# Patient Record
Sex: Female | Born: 1950 | Race: White | Hispanic: No | Marital: Married | Smoking: Former smoker
Health system: Southern US, Community
[De-identification: ages and names within clinical notes are randomized; demographics above are authoritative.]

## PROBLEM LIST (undated history)

## (undated) DIAGNOSIS — J45909 Unspecified asthma, uncomplicated: Secondary | ICD-10-CM

## (undated) DIAGNOSIS — M1990 Unspecified osteoarthritis, unspecified site: Secondary | ICD-10-CM

## (undated) DIAGNOSIS — G5600 Carpal tunnel syndrome, unspecified upper limb: Secondary | ICD-10-CM

## (undated) DIAGNOSIS — L409 Psoriasis, unspecified: Secondary | ICD-10-CM

## (undated) DIAGNOSIS — F0390 Unspecified dementia without behavioral disturbance: Secondary | ICD-10-CM

## (undated) DIAGNOSIS — L309 Dermatitis, unspecified: Secondary | ICD-10-CM

## (undated) DIAGNOSIS — E7800 Pure hypercholesterolemia, unspecified: Secondary | ICD-10-CM

## (undated) DIAGNOSIS — I10 Essential (primary) hypertension: Secondary | ICD-10-CM

## (undated) HISTORY — PX: HAND SURGERY: SHX662

## (undated) HISTORY — PX: KNEE SURGERY: SHX244

## (undated) HISTORY — PX: SHOULDER SURGERY: SHX246

## (undated) HISTORY — PX: TONSILLECTOMY: SUR1361

## (undated) HISTORY — DX: Dermatitis, unspecified: L30.9

## (undated) HISTORY — DX: Pure hypercholesterolemia, unspecified: E78.00

---

## 2001-05-14 ENCOUNTER — Ambulatory Visit (HOSPITAL_BASED_OUTPATIENT_CLINIC_OR_DEPARTMENT_OTHER): Admission: RE | Admit: 2001-05-14 | Discharge: 2001-05-14 | Payer: Self-pay | Admitting: Orthopedic Surgery

## 2002-07-08 ENCOUNTER — Emergency Department (HOSPITAL_COMMUNITY): Admission: EM | Admit: 2002-07-08 | Discharge: 2002-07-08 | Payer: Self-pay | Admitting: Emergency Medicine

## 2002-07-09 ENCOUNTER — Encounter: Payer: Self-pay | Admitting: Emergency Medicine

## 2003-06-03 ENCOUNTER — Ambulatory Visit (HOSPITAL_BASED_OUTPATIENT_CLINIC_OR_DEPARTMENT_OTHER): Admission: RE | Admit: 2003-06-03 | Discharge: 2003-06-03 | Payer: Self-pay | Admitting: Orthopedic Surgery

## 2003-06-03 ENCOUNTER — Ambulatory Visit (HOSPITAL_COMMUNITY): Admission: RE | Admit: 2003-06-03 | Discharge: 2003-06-03 | Payer: Self-pay | Admitting: Orthopedic Surgery

## 2004-07-25 ENCOUNTER — Ambulatory Visit (HOSPITAL_BASED_OUTPATIENT_CLINIC_OR_DEPARTMENT_OTHER): Admission: RE | Admit: 2004-07-25 | Discharge: 2004-07-25 | Payer: Self-pay | Admitting: Orthopedic Surgery

## 2004-07-25 ENCOUNTER — Ambulatory Visit (HOSPITAL_COMMUNITY): Admission: RE | Admit: 2004-07-25 | Discharge: 2004-07-25 | Payer: Self-pay | Admitting: Orthopedic Surgery

## 2012-03-20 ENCOUNTER — Other Ambulatory Visit: Payer: Self-pay | Admitting: Orthopedic Surgery

## 2012-03-20 DIAGNOSIS — M25569 Pain in unspecified knee: Secondary | ICD-10-CM

## 2012-03-21 ENCOUNTER — Ambulatory Visit
Admission: RE | Admit: 2012-03-21 | Discharge: 2012-03-21 | Disposition: A | Discharge status: Home / Self Care | Payer: BC Managed Care – PPO | Source: Ambulatory Visit | Attending: Orthopedic Surgery | Admitting: Orthopedic Surgery

## 2012-03-21 DIAGNOSIS — M25569 Pain in unspecified knee: Secondary | ICD-10-CM

## 2013-03-28 ENCOUNTER — Ambulatory Visit (INDEPENDENT_AMBULATORY_CARE_PROVIDER_SITE_OTHER): Payer: BC Managed Care – PPO | Admitting: Emergency Medicine

## 2013-03-28 VITALS — BP 192/90 | HR 86 | Temp 98.0°F | Resp 16 | Ht 67.0 in | Wt 179.0 lb

## 2013-03-28 DIAGNOSIS — S025XXB Fracture of tooth (traumatic), initial encounter for open fracture: Secondary | ICD-10-CM

## 2013-03-28 DIAGNOSIS — S025XXA Fracture of tooth (traumatic), initial encounter for closed fracture: Secondary | ICD-10-CM

## 2013-03-28 MED ORDER — CLINDAMYCIN HCL 150 MG PO CAPS: 150.0000 mg | ORAL_CAPSULE | Freq: Three times a day (TID) | ORAL | Status: DC

## 2013-03-28 MED ORDER — CEPHALEXIN 500 MG PO CAPS: 500.0000 mg | ORAL_CAPSULE | Freq: Four times a day (QID) | ORAL | Status: DC

## 2013-03-28 MED ORDER — HYDROCODONE-ACETAMINOPHEN 5-325 MG PO TABS: 1.0000 | ORAL_TABLET | ORAL | Status: DC | PRN

## 2013-03-28 NOTE — Progress Notes (Signed)
[facility] [address], [place] [address] [phone]  Date:  03/28/2013   Name:  [name]   DOB:  1951-06-23   MRN:  [id number]  PCP:  No primary provider on file.    Chief Complaint: Abscess   History of Present Illness:  [name] is a 62 y.o. very pleasant female patient who presents with the following:  Lost a filling in left lower jaw at least a week ago.  Began to have pain and swelling Thursday and was unable to see her dentist.  She will try for appt next week. No fever or chills.  Thermal sensitivity and air sensitivity.  No improvement with over the counter medications or other home remedies. Denies other complaint or health concern today.   There are no active problems to display for this patient.   History reviewed. No pertinent past medical history.  History reviewed. No pertinent past surgical history.  History  Substance Use Topics  . Smoking status: Never Smoker   . Smokeless tobacco: Not on file  . Alcohol Use: Yes    History reviewed. No pertinent family history.  Allergies  Allergen Reactions  . Ibuprofen     dizzy    Medication list has been reviewed and updated.  No current outpatient prescriptions on file prior to visit.   No current facility-administered medications on file prior to visit.    Review of Systems:  As per HPI, otherwise negative.    Physical Examination: Filed Vitals:   03/28/13 1213  BP: 192/90  Pulse: 86  Temp: 98 F (36.7 C)  Resp: 16   Filed Vitals:   03/28/13 1213  Height: 5\' 7"  (1.702 m)  Weight: 179 lb (81.194 kg)   Body mass index is 28.03 kg/(m^2). Ideal Body Weight: Weight in (lb) to have BMI = 25: 159.3   GEN: WDWN, NAD, Non-toxic, Alert & Oriented x 3 HEENT: Atraumatic, Normocephalic.  Ears and Nose: No external deformity. EXTR: No clubbing/cyanosis/edema NEURO: Normal gait.  PSYCH: Normally interactive. Conversant. Not depressed or anxious appearing.   Calm demeanor.  Mouth:  Fracture #19 and 20.  Minimal cellulitis.  Assessment and Plan: Fracture teeth Dentist  Keflex Clindamycin vicodin   Signed,  Phillips Odor, MD

## 2013-03-28 NOTE — Patient Instructions (Addendum)
Dental Fracture  You have a dental fracture or injury. This can mean the tooth is loose, has a chip in the enamel or is broken. If just the outer enamel is chipped, there is a good chance the tooth will not become infected. The only treatment needed may be to smooth off a rough edge. Fractures into the deeper layers (dentin and pulp) cause greater pain and are more likely to become infected. These require you to see a dentist as soon as possible to save the tooth.  Loose teeth may need to be wired or bonded with a plastic splint to hold them in place. A paste may be painted on the open area of the broken tooth to reduce the pain. Antibiotics and pain medicine may be prescribed. Choosing a soft or liquid diet and rinsing the mouth out with warm water after meals may be helpful.  See your dentist as recommended. Failure to seek care or follow up with a dentist or other specialist as recommended could result in the loss of your tooth, infection, or permanent dental problems.  SEEK MEDICAL CARE IF:    You have increased pain not controlled with medicines.   You have swelling around the tooth, in the face or neck.   You have bleeding which starts, continues, or gets worse.   You have a fever.  Document Released: 09/13/2004 Document Revised: 10/29/2011 Document Reviewed: 06/28/2009  ExitCare Patient Information 2014 [employer].

## 2014-09-06 ENCOUNTER — Emergency Department (HOSPITAL_BASED_OUTPATIENT_CLINIC_OR_DEPARTMENT_OTHER): Payer: Medicare Other

## 2014-09-06 ENCOUNTER — Encounter (HOSPITAL_BASED_OUTPATIENT_CLINIC_OR_DEPARTMENT_OTHER): Payer: Self-pay | Admitting: *Deleted

## 2014-09-06 ENCOUNTER — Emergency Department (HOSPITAL_BASED_OUTPATIENT_CLINIC_OR_DEPARTMENT_OTHER)
Admission: EM | Admit: 2014-09-06 | Discharge: 2014-09-06 | Disposition: A | Discharge status: Home / Self Care | Payer: Medicare Other | Attending: Emergency Medicine | Admitting: Emergency Medicine

## 2014-09-06 DIAGNOSIS — S99921A Unspecified injury of right foot, initial encounter: Secondary | ICD-10-CM | POA: Diagnosis present

## 2014-09-06 DIAGNOSIS — Y998 Other external cause status: Secondary | ICD-10-CM | POA: Insufficient documentation

## 2014-09-06 DIAGNOSIS — Y9302 Activity, running: Secondary | ICD-10-CM | POA: Insufficient documentation

## 2014-09-06 DIAGNOSIS — W228XXA Striking against or struck by other objects, initial encounter: Secondary | ICD-10-CM | POA: Insufficient documentation

## 2014-09-06 DIAGNOSIS — Y9289 Other specified places as the place of occurrence of the external cause: Secondary | ICD-10-CM | POA: Diagnosis not present

## 2014-09-06 DIAGNOSIS — Z792 Long term (current) use of antibiotics: Secondary | ICD-10-CM | POA: Diagnosis not present

## 2014-09-06 DIAGNOSIS — T1490XA Injury, unspecified, initial encounter: Secondary | ICD-10-CM

## 2014-09-06 DIAGNOSIS — S96911A Strain of unspecified muscle and tendon at ankle and foot level, right foot, initial encounter: Secondary | ICD-10-CM | POA: Diagnosis not present

## 2014-09-06 NOTE — ED Notes (Signed)
Right ankle pain. She ran into her freezer this am.

## 2014-09-06 NOTE — ED Provider Notes (Signed)
CSN: [id number]     Arrival date & time 09/06/14  1211 History   First MD Initiated Contact with Patient 09/06/14 1346     Chief Complaint  Patient presents with  . Foot Pain     (Consider location/radiation/quality/duration/timing/severity/associated sxs/prior Treatment) HPI Comments: Pt state that she ran into the freezer this morning and now she is having right lateral ankle pain and swelling.no previous injury  Patient is a 64 y.o. female presenting with lower extremity pain. The history is provided by the patient. No language interpreter was used.  Foot Pain This is a new problem. The current episode started today. The problem occurs constantly. The problem has been unchanged. The symptoms are aggravated by walking. She has tried nothing for the symptoms.    History reviewed. No pertinent past medical history. Past Surgical History  Procedure Laterality Date  . Tonsillectomy     No family history on file. History  Substance Use Topics  . Smoking status: Never Smoker   . Smokeless tobacco: Not on file  . Alcohol Use: Yes   OB History    No data available     Review of Systems  All other systems reviewed and are negative.     Allergies  Ibuprofen  Home Medications   Prior to Admission medications   Medication Sig Start Date End Date Taking? Authorizing Provider  cephALEXin (KEFLEX) 500 MG capsule Take 1 capsule (500 mg total) by mouth 4 (four) times daily. 03/28/13   Carmelina DaneJeffery S Anderson, MD  clindamycin (CLEOCIN) 150 MG capsule Take 1 capsule (150 mg total) by mouth 3 (three) times daily. 03/28/13   Carmelina DaneJeffery S Anderson, MD  HYDROcodone-acetaminophen (NORCO) 5-325 MG per tablet Take 1-2 tablets by mouth every 4 (four) hours as needed for pain. 03/28/13   Carmelina DaneJeffery S Anderson, MD   BP 161/78 mmHg  Pulse 73  Temp(Src) 97.7 F (36.5 C) (Oral)  Resp 18  Ht 5\' 6"  (1.676 m)  Wt 180 lb (81.647 kg)  BMI 29.07 kg/m2  SpO2 99% Physical Exam  Constitutional: She is oriented  to person, place, and time. She appears well-developed and well-nourished.  Cardiovascular: Normal rate and regular rhythm.   Pulmonary/Chest: Effort normal and breath sounds normal.  Musculoskeletal: Normal range of motion.  Swelling noted to the  Right lateral ankle swelling. Pulses intact. Full rom  Neurological: She is alert and oriented to person, place, and time.  Skin: Skin is warm and dry.  Psychiatric: She has a normal mood and affect.  Nursing note and vitals reviewed.   ED Course  Procedures (including critical care time) Labs Review Labs Reviewed - No data to display  Imaging Review Dg Foot Complete Right  09/06/2014   CLINICAL DATA:  Initial encounter for lateral right foot pain after stubbing right foot (pinky toe) this morning on refrigerator  EXAM: RIGHT FOOT COMPLETE - 3+ VIEW  COMPARISON:  None.  FINDINGS: Three-view exam shows no evidence for an acute fracture. No subluxation or dislocation. Degenerative changes with hallux valgus deformity are seen at the MTP joint of the great toe.  IMPRESSION: Negative.   Electronically Signed   By: Kennith CenterEric  Mansell M.D.   On: 09/06/2014 12:53     EKG Interpretation None      MDM   Final diagnoses:  Ankle strain, right, initial encounter    Pt placed in aso and instructed on ortho follow up . Pt refusing any pain medication    Teressa LowerVrinda Tanganika Barradas, NP 09/06/14 1426  Candyce Churn III, MD 09/06/14 [phone]

## 2014-09-06 NOTE — Discharge Instructions (Signed)
Ankle Sprain  An ankle sprain is an injury to the strong, fibrous tissues (ligaments) that hold your ankle bones together.   HOME CARE   · Put ice on your ankle for 1-2 days or as told by your doctor.  ¨ Put ice in a plastic bag.  ¨ Place a towel between your skin and the bag.  ¨ Leave the ice on for 15-20 minutes at a time, every 2 hours while you are awake.  · Only take medicine as told by your doctor.  · Raise (elevate) your injured ankle above the level of your heart as much as possible for 2-3 days.  · Use crutches if your doctor tells you to. Slowly put your own weight on the affected ankle. Use the crutches until you can walk without pain.  · If you have a plaster splint:  ¨ Do not rest it on anything harder than a pillow for 24 hours.  ¨ Do not put weight on it.  ¨ Do not get it wet.  ¨ Take it off to shower or bathe.  · If given, use an elastic wrap or support stocking for support. Take the wrap off if your toes lose feeling (numb), tingle, or turn cold or blue.  · If you have an air splint:  ¨ Add or let out air to make it comfortable.  ¨ Take it off at night and to shower and bathe.  ¨ Wiggle your toes and move your ankle up and down often while you are wearing it.  GET HELP IF:  · You have rapidly increasing bruising or puffiness (swelling).  · Your toes feel very cold.  · You lose feeling in your foot.  · Your medicine does not help your pain.  GET HELP RIGHT AWAY IF:   · Your toes lose feeling (numb) or turn blue.  · You have severe pain that is increasing.  MAKE SURE YOU:   · Understand these instructions.  · Will watch your condition.  · Will get help right away if you are not doing well or get worse.  Document Released: 01/23/2008 Document Revised: 12/21/2013 Document Reviewed: 02/18/2012  ExitCare® Patient Information ©2015 [employer]. This information is not intended to replace advice given to you by your health care provider. Make sure you discuss any questions you have with your health care  provider.

## 2015-04-16 ENCOUNTER — Encounter (HOSPITAL_COMMUNITY): Payer: Self-pay | Admitting: *Deleted

## 2015-04-16 ENCOUNTER — Emergency Department (HOSPITAL_BASED_OUTPATIENT_CLINIC_OR_DEPARTMENT_OTHER)
Admit: 2015-04-16 | Discharge: 2015-04-16 | Disposition: A | Discharge status: Home / Self Care | Payer: Medicare Other | Attending: Emergency Medicine | Admitting: Emergency Medicine

## 2015-04-16 ENCOUNTER — Observation Stay (HOSPITAL_COMMUNITY)
Admission: EM | Admit: 2015-04-16 | Discharge: 2015-04-18 | Disposition: A | Discharge status: Home / Self Care | Payer: Medicare Other | Attending: Family Medicine | Admitting: Family Medicine

## 2015-04-16 ENCOUNTER — Emergency Department (HOSPITAL_COMMUNITY): Payer: Medicare Other

## 2015-04-16 DIAGNOSIS — Z7982 Long term (current) use of aspirin: Secondary | ICD-10-CM | POA: Insufficient documentation

## 2015-04-16 DIAGNOSIS — Z886 Allergy status to analgesic agent status: Secondary | ICD-10-CM | POA: Insufficient documentation

## 2015-04-16 DIAGNOSIS — R410 Disorientation, unspecified: Secondary | ICD-10-CM | POA: Diagnosis not present

## 2015-04-16 DIAGNOSIS — R413 Other amnesia: Secondary | ICD-10-CM | POA: Insufficient documentation

## 2015-04-16 DIAGNOSIS — E669 Obesity, unspecified: Secondary | ICD-10-CM | POA: Insufficient documentation

## 2015-04-16 DIAGNOSIS — Z807 Family history of other malignant neoplasms of lymphoid, hematopoietic and related tissues: Secondary | ICD-10-CM | POA: Insufficient documentation

## 2015-04-16 DIAGNOSIS — Z6829 Body mass index (BMI) 29.0-29.9, adult: Secondary | ICD-10-CM | POA: Insufficient documentation

## 2015-04-16 DIAGNOSIS — E785 Hyperlipidemia, unspecified: Secondary | ICD-10-CM | POA: Diagnosis not present

## 2015-04-16 DIAGNOSIS — I10 Essential (primary) hypertension: Secondary | ICD-10-CM | POA: Insufficient documentation

## 2015-04-16 DIAGNOSIS — I6523 Occlusion and stenosis of bilateral carotid arteries: Secondary | ICD-10-CM | POA: Insufficient documentation

## 2015-04-16 DIAGNOSIS — R42 Dizziness and giddiness: Principal | ICD-10-CM | POA: Insufficient documentation

## 2015-04-16 DIAGNOSIS — G459 Transient cerebral ischemic attack, unspecified: Secondary | ICD-10-CM | POA: Diagnosis present

## 2015-04-16 DIAGNOSIS — R7303 Prediabetes: Secondary | ICD-10-CM | POA: Insufficient documentation

## 2015-04-16 DIAGNOSIS — M7989 Other specified soft tissue disorders: Secondary | ICD-10-CM

## 2015-04-16 DIAGNOSIS — Z87891 Personal history of nicotine dependence: Secondary | ICD-10-CM | POA: Insufficient documentation

## 2015-04-16 DIAGNOSIS — R471 Dysarthria and anarthria: Secondary | ICD-10-CM | POA: Diagnosis not present

## 2015-04-16 DIAGNOSIS — G9340 Encephalopathy, unspecified: Secondary | ICD-10-CM | POA: Insufficient documentation

## 2015-04-16 DIAGNOSIS — Z8249 Family history of ischemic heart disease and other diseases of the circulatory system: Secondary | ICD-10-CM | POA: Insufficient documentation

## 2015-04-16 DIAGNOSIS — L409 Psoriasis, unspecified: Secondary | ICD-10-CM | POA: Diagnosis not present

## 2015-04-16 DIAGNOSIS — Z9889 Other specified postprocedural states: Secondary | ICD-10-CM | POA: Insufficient documentation

## 2015-04-16 HISTORY — DX: Psoriasis, unspecified: L40.9

## 2015-04-16 LAB — COMPREHENSIVE METABOLIC PANEL
ALBUMIN: 3.7 g/dL (ref 3.5–5.0)
ALK PHOS: 107 U/L (ref 38–126)
ALT: 30 U/L (ref 14–54)
ANION GAP: 8 (ref 5–15)
AST: 23 U/L (ref 15–41)
BILIRUBIN TOTAL: 0.6 mg/dL (ref 0.3–1.2)
BUN: 12 mg/dL (ref 6–20)
CALCIUM: 9.4 mg/dL (ref 8.9–10.3)
CO2: 27 mmol/L (ref 22–32)
Chloride: 105 mmol/L (ref 101–111)
Creatinine, Ser: 0.82 mg/dL (ref 0.44–1.00)
GLUCOSE: 96 mg/dL (ref 65–99)
POTASSIUM: 3.9 mmol/L (ref 3.5–5.1)
Sodium: 140 mmol/L (ref 135–145)
TOTAL PROTEIN: 6.3 g/dL — AB (ref 6.5–8.1)

## 2015-04-16 LAB — CBC WITH DIFFERENTIAL/PLATELET
Basophils Absolute: 0 10*3/uL (ref 0.0–0.1)
Basophils Relative: 1 % (ref 0–1)
Eosinophils Absolute: 0.3 10*3/uL (ref 0.0–0.7)
Eosinophils Relative: 5 % (ref 0–5)
HEMATOCRIT: 40.9 % (ref 36.0–46.0)
HEMOGLOBIN: 13.4 g/dL (ref 12.0–15.0)
LYMPHS ABS: 1.8 10*3/uL (ref 0.7–4.0)
LYMPHS PCT: 27 % (ref 12–46)
MCH: 29.3 pg (ref 26.0–34.0)
MCHC: 32.8 g/dL (ref 30.0–36.0)
MCV: 89.3 fL (ref 78.0–100.0)
MONO ABS: 0.5 10*3/uL (ref 0.1–1.0)
MONOS PCT: 8 % (ref 3–12)
NEUTROS ABS: 3.9 10*3/uL (ref 1.7–7.7)
NEUTROS PCT: 59 % (ref 43–77)
Platelets: 182 10*3/uL (ref 150–400)
RBC: 4.58 MIL/uL (ref 3.87–5.11)
RDW: 14 % (ref 11.5–15.5)
WBC: 6.5 10*3/uL (ref 4.0–10.5)

## 2015-04-16 LAB — VITAMIN B12: Vitamin B-12: 349 pg/mL (ref 180–914)

## 2015-04-16 LAB — TSH: TSH: 2.531 u[IU]/mL (ref 0.350–4.500)

## 2015-04-16 MED ORDER — ENOXAPARIN SODIUM 40 MG/0.4ML ~~LOC~~ SOLN
40.0000 mg | SUBCUTANEOUS | Status: DC
  Administered 2015-04-16 – 2015-04-17 (×2): 40 mg via SUBCUTANEOUS
  Filled 2015-04-16 (×2): qty 0.4

## 2015-04-16 MED ORDER — STROKE: EARLY STAGES OF RECOVERY BOOK: Freq: Once | Status: DC

## 2015-04-16 MED ORDER — SENNOSIDES-DOCUSATE SODIUM 8.6-50 MG PO TABS: 1.0000 | ORAL_TABLET | Freq: Every evening | ORAL | Status: DC | PRN

## 2015-04-16 MED ORDER — ACETAMINOPHEN 325 MG PO TABS: 650.0000 mg | ORAL_TABLET | ORAL | Status: DC | PRN

## 2015-04-16 MED ORDER — ASPIRIN EC 81 MG PO TBEC
81.0000 mg | DELAYED_RELEASE_TABLET | Freq: Every day | ORAL | Status: DC
  Administered 2015-04-16 – 2015-04-18 (×3): 81 mg via ORAL
  Filled 2015-04-16 (×3): qty 1

## 2015-04-16 MED ORDER — ACETAMINOPHEN 650 MG RE SUPP: 650.0000 mg | RECTAL | Status: DC | PRN

## 2015-04-16 NOTE — ED Notes (Signed)
MD at bedside. 

## 2015-04-16 NOTE — ED Notes (Signed)
Pt ambulated to restroom, pt reports feeling very dizzy.

## 2015-04-16 NOTE — H&P (Signed)
Family Medicine Teaching [facility] Admission History and Physical Service Pager: [phone]  Patient name: VERONDA GABOR Medical record number: [id number] Date of birth: [birth year] Age: 64 y.o. Gender: female  Primary Care Provider: No primary care provider on file. Consultants: neuro Code Status: Full  Chief Complaint: dizziness/slurred speech  Assessment and Plan: AJWA KIMBERLEY is a 64 y.o. female presenting with dizziness . PMH is significant for recent right hand surgery for Dupuytren's contracture, psoriasis, history of smoking  # TIA: symptoms lasted <2 hours. Seen by neuro in ED. CT head negative. Lab workup and EKG unremarkable. Risk factor = 40 pack year smoking history, quit 2007. DDx would also include seizure. - telemetry - RN stroke screen - MRI/MRA - AM labs: lipid panel, A1c, TSH already drawn - per neuro recs added B12 for some concern of continued confusion - PT/OT/SLP eval, will also have PT vestibular eval  # Dupuytren contracture: ortho consulted by phone in ED for some concern of right upper arm swelling (same side as hand surgery). Duplex ultrasound obtained in ED was negative for upper extremity DVT - monitor clinically - do not feel ortho needs to evaluate patient at this time while in the hospital  # Psoriasis: stable - monitor  FEN/GI: regular diet / saline lock Prophylaxis: lovenox  Disposition: admit to tele  History of Present Illness: TONY FRISCIA is a 64 y.o. female presenting with dizziness and slurred speech. Daughter at bedside helps with history as patient cannot completely remember details. Around 9-10am this morning patient was inside trying to get dressed when she developed dizziness. Daughter states she did not fall but was close. Also noted to have some slurred speech. No other symptoms. Dizziness is described as a room spinning sensation worse when walking/upright. Symptoms lasted about 2 hours and had resolved by arrival to the  ED. She also complains of some right arm swelling; of note recently had surgery on her right hand for Dupuytren's contracture 1 week ago.  Review Of Systems: Per HPI with the following additions: no fever/chill, no CP, no SOB, no weakness, no changes in vision, no trouble swallowing, no dysuria, +joint pains (chronic) Otherwise 12 point review of systems was performed and was unremarkable.  There are no active problems to display for this patient.  Past Medical History: Past Medical History  Diagnosis Date  . Psoriasis    Past Surgical History: Past Surgical History  Procedure Laterality Date  . Tonsillectomy    . Hand surgery    . Shoulder surgery    . Knee surgery     Social History: Social History  Substance Use Topics  . Smoking status: Never Smoker   . Smokeless tobacco: None  . Alcohol Use: Yes   Additional social history: lives at home with husband  Please also refer to relevant sections of EMR.  Family History: No family history on file. Allergies and Medications: Allergies  Allergen Reactions  . Ibuprofen     dizzy   No current facility-administered medications on file prior to encounter.   Current Outpatient Prescriptions on File Prior to Encounter  Medication Sig Dispense Refill  . cephALEXin (KEFLEX) 500 MG capsule Take 1 capsule (500 mg total) by mouth 4 (four) times daily. (Patient not taking: Reported on 04/16/2015) 30 capsule 0  . clindamycin (CLEOCIN) 150 MG capsule Take 1 capsule (150 mg total) by mouth 3 (three) times daily. (Patient not taking: Reported on 04/16/2015) 30 capsule 0  . HYDROcodone-acetaminophen (NORCO) 5-325 MG per  tablet Take 1-2 tablets by mouth every 4 (four) hours as needed for pain. (Patient not taking: Reported on 04/16/2015) 20 tablet 0    Objective: BP 122/66 mmHg  Pulse 72  Temp(Src) 97.7 F (36.5 C) (Oral)  Resp 23  Ht  (1.651 m)  Wt 180 lb (81.647 kg)  BMI 29.95 kg/m2  SpO2 97% Exam: General: NAD Eyes: PERRL,  EOMI, conjunctiva normal ENTM: no oropharyngeal lesions, nares patent Neck: supple, no thyromegaly noted Cardiovascular: RRR, normal s1s2 no mrg 2+ radial, PT pusles bilaterally. No LE edema. Respiratory: CTAB, normal effort Abdomen: soft, NTND, normal bowel sounds MSK: normal bulk/tone, no deformities Skin: scattered plaques LE Neuro: alert and oriented x 3. CN2-12 normal. Normal strength in UE (unable to test grip on the right), LE. No Pronator drift. Reflexes 2+ patella bilaterally. Normal speech. Psych: normal thought content. Mood normal.  Labs and Imaging: CBC BMET   Recent Labs Lab 04/16/15 1331  WBC 6.5  HGB 13.4  HCT 40.9  PLT 182    Recent Labs Lab 04/16/15 1331  NA 140  K 3.9  CL 105  CO2 27  BUN 12  CREATININE 0.82  GLUCOSE 96  CALCIUM 9.4     Ct Head Wo Contrast  04/16/2015   CLINICAL DATA:  Dizziness beginning suddenly this morning, RIGHT hand swelling post RIGHT hand surgery 1 week ago, question stroke  EXAM: CT HEAD WITHOUT CONTRAST  TECHNIQUE: Contiguous axial images were obtained from the base of the skull through the vertex without intravenous contrast.  COMPARISON:  None  FINDINGS: Normal ventricular morphology.  No midline shift or mass effect.  Normal appearance of brain parenchyma.  No intracranial hemorrhage, mass lesion, or acute infarction.  Visualized paranasal sinuses and mastoid air cells clear.  Bones unremarkable.  Minimal atherosclerotic calcification at the carotid siphons bilaterally.  IMPRESSION: No acute intracranial abnormalities.   Electronically Signed   By: Ulyses Southward M.D.   On: 04/16/2015 13:37     Nani Ravens, MD 04/16/2015, 4:56 PM PGY-3, [place] [facility] FPTS Intern pager: [phone], text pages welcome

## 2015-04-16 NOTE — ED Notes (Signed)
Neuro at bedside.

## 2015-04-16 NOTE — Progress Notes (Signed)
*  PRELIMINARY RESULTS* Vascular Ultrasound Right upper extremity venous duplex has been completed.  Preliminary findings: negative for DVT.  Farrel Demark, RDMS, RVT  04/16/2015, 2:22 PM

## 2015-04-16 NOTE — Consult Note (Signed)
Reason for Consult: Dizziness Referring Physician: Dr Dayna Barker  CC: Dizzy  HPI: PATI THINNES is a 64 y.o. female who is believed to be healthy but has not had any regular medical follow-up for several years. She had surgery on her right hand approximately one week ago performed by Dr. Amedeo Plenty. The patient's daughter drove over to her mother's house this morning at around 9:30 to pick up something. Her mother walked out of the house towards the car and her daughter noted that her mother appeared to be staggering. She helped her mother back and to the house, sat her down, and called 911. The daughter noticed some mild dysarthria and felt that this was also noted by the EMTs. The patient felt as if the room was spinning. She also felt that her mother was mildly confused. The patient herself does not remember much about the incident. She remembers working in the house and getting ready to go out to the car; however, she is foggy about the details. The patient's speech was back to normal in the emergency department, although she still seemed to have some mild cognitive difficulties. When her daughter tried to help her mother get up and go to the bathroom earlier the patient was once again dizzy. The patient's daughter estimates that the worst of these symptoms lasted approximately 2 hours. The patient denies any recent headaches, nausea or vomiting, visual changes, speech difficulties, changes in hearing, abnormal sounds or ringing in her ears.  The patient's husband and daughter both report that the patient's memory has been getting progressively worse over the past 6 months to one year. She had been taking aspirin 81 mg daily but this was recently held for her hand surgery.  LKW: unclear, but prior to 9:30  History reviewed. No pertinent past medical history.  Past Surgical History  Procedure Laterality Date  . Tonsillectomy    . Hand surgery    . Shoulder surgery    . Knee surgery      Family  history: Her father died in his 46s from multiple myeloma. He also had congestive heart failure and hypertension. Her mother is alive and well at age 26.   Social History:  The patient quit smoking in 2005 or 2006. She smoked 1 pack per day for 30-40 years. She drinks alcohol rarely. She reports that she does not use illicit drugs.  Allergies  Allergen Reactions  . Ibuprofen     dizzy    Medications:  No current facility-administered medications for this encounter.   Current Outpatient Prescriptions  Medication Sig Dispense Refill  . cephALEXin (KEFLEX) 500 MG capsule Take 1 capsule (500 mg total) by mouth 4 (four) times daily. 30 capsule 0  . clindamycin (CLEOCIN) 150 MG capsule Take 1 capsule (150 mg total) by mouth 3 (three) times daily. 30 capsule 0  . HYDROcodone-acetaminophen (NORCO) 5-325 MG per tablet Take 1-2 tablets by mouth every 4 (four) hours as needed for pain. 20 tablet 0   I have reviewed the patient's current medications.  ROS: History obtained from the patient  General ROS: negative for - chills, fatigue, fever, night sweats, weight gain or weight loss Psychological ROS: negative for - behavioral disorder, hallucinations, memory difficulties, mood swings or suicidal ideation Ophthalmic ROS: negative for - blurry vision, double vision, eye pain or loss of vision ENT ROS: negative for - epistaxis, nasal discharge, oral lesions, sore throat, tinnitus or vertigo Allergy and Immunology ROS: negative for - hives or itchy/watery eyes Hematological  and Lymphatic ROS: negative for - bleeding problems, bruising or swollen lymph nodes Endocrine ROS: negative for - galactorrhea, hair pattern changes, polydipsia/polyuria or temperature intolerance Respiratory ROS: negative for - cough, hemoptysis, shortness of breath or wheezing Cardiovascular ROS: negative for - chest pain, dyspnea on exertion, edema or irregular heartbeat Gastrointestinal ROS: negative for - abdominal pain,  diarrhea, hematemesis, nausea/vomiting or stool incontinence Genito-Urinary ROS: negative for - dysuria, hematuria, incontinence or urinary frequency/urgency Musculoskeletal ROS: negative for - joint swelling or muscular weakness Neurological ROS: as noted in HPI Dermatological ROS: negative for rash and skin lesion changes   Physical Examination: Blood pressure 110/81, pulse 68, temperature 97.7 F (36.5 C), temperature source Oral, resp. rate 15, height '5\' 5"'  (1.651 m), weight 81.647 kg (180 lb), SpO2 98 %.  General - pleasant 64 year old female in no acute distress. Heart - Regular rate and rhythm - no murmer appreciated Lungs - Clear to auscultation anteriorly Abdomen - Soft - non tender Extremities - Distal pulses intact - no edema Skin - Warm and dry Eyes: non-injected sclera HEENT: no op obstruction.  Psych: appropriate affect  Neurologic Examination Mental Status: Alert, oriented, thought content mostly appropriate. The patient had difficulty with simple math problems and had difficulty naming for legged animals. She knew the current president but cannot recall the previous presidents.  Speech fluent without evidence of aphasia.  Able to follow 3 step commands without difficulty. Cranial Nerves: II: Discs not visualized; Visual fields grossly normal, pupils equal, round, reactive to light and accommodation III,IV, VI: ptosis not present, extra-ocular motions intact bilaterally V,VII: smile symmetric, facial light touch sensation normal bilaterally VIII: hearing normal bilaterally IX,X: gag reflex present XI: bilateral shoulder shrug XII: midline tongue extension Motor: Right : Upper extremity   5/5    Left:     Upper extremity   5/5  Lower extremity   5/5     Lower extremity   5/5 Tone and bulk:normal tone throughout; no atrophy noted Sensory: light touch intact throughout, bilaterally Deep Tendon Reflexes: 2+ and symmetric throughout Plantars: Right: Equivocal   Left:  Equivocal Cerebellar: normal finger-to-nose, normal rapid alternating movements and normal heel-to-shin test Gait: Not tested for safety reasons   Laboratory Studies:   Basic Metabolic Panel:  Recent Labs Lab 04/16/15 1331  NA 140  K 3.9  CL 105  CO2 27  GLUCOSE 96  BUN 12  CREATININE 0.82  CALCIUM 9.4    Liver Function Tests:  Recent Labs Lab 04/16/15 1331  AST 23  ALT 30  ALKPHOS 107  BILITOT 0.6  PROT 6.3*  ALBUMIN 3.7   No results for input(s): LIPASE, AMYLASE in the last 168 hours. No results for input(s): AMMONIA in the last 168 hours.  CBC:  Recent Labs Lab 04/16/15 1331  WBC 6.5  NEUTROABS 3.9  HGB 13.4  HCT 40.9  MCV 89.3  PLT 182    Cardiac Enzymes: No results for input(s): CKTOTAL, CKMB, CKMBINDEX, TROPONINI in the last 168 hours.  BNP: Invalid input(s): POCBNP  CBG: No results for input(s): GLUCAP in the last 168 hours.  Microbiology: No results found for this or any previous visit.  Coagulation Studies: No results for input(s): LABPROT, INR in the last 72 hours.  Urinalysis: No results for input(s): COLORURINE, LABSPEC, PHURINE, GLUCOSEU, HGBUR, BILIRUBINUR, KETONESUR, PROTEINUR, UROBILINOGEN, NITRITE, LEUKOCYTESUR in the last 168 hours.  Invalid input(s): APPERANCEUR  Lipid Panel:  No results found for: CHOL, TRIG, HDL, CHOLHDL, VLDL, LDLCALC  HgbA1C: No results  found for: HGBA1C  Urine Drug Screen:  No results found for: LABOPIA, COCAINSCRNUR, LABBENZ, AMPHETMU, THCU, LABBARB  Alcohol Level: No results for input(s): ETH in the last 168 hours.  Other results: EKG: Sinus rhythm rate 67 bpm. Please refer to the formal cardiology reading for complete details.  Imaging:  Ct Head Wo Contrast 04/16/2015    No acute intracranial abnormalities.       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 There was also a question of some mild confusion. A head CT in the emergency department showed no acute changes. The patient also had a right upper extremity duplex today to rule out a DVT follow her recent right hand surgery. This study was negative. Consider MRI MRA. Will discuss with Dr. Leonel Ramsay. Consider resuming aspirin therapy.  Definitive assessment and plan to follow per Dr. Leonel Ramsay.   Mikey Bussing PA-C [facility] Pager [phone] 04/16/2015, 2:57 PM   I have seen and evaluated the patient. I have reviewed the above note and made appropriate changes.    This is a 64 year old woman who had transient dysarthria, gait unsteadiness, sensation of vertigo. The transient nature and the constellation of symptoms does make me concerned for transient ischemic attack.  I also am concerned about the patient's declining memory including her difficulty with relating events of the history of the day. Apparently this is been a progressive problem over the past few months to year.  1. HgbA1c, fasting lipid panel 2. MRI, MRA  of the brain without contrast 3. Frequent neuro checks 4. Echocardiogram 5. Carotid dopplers 6. Prophylactic therapy-Antiplatelet med: Aspirin - dose 321m PO or 3071mPR 7. Risk factor modification 8. Telemetry monitoring 9. PT consult, OT consult, Speech consult 10. TSH, B12  McRoland RackMD [facility] [phone] 7pm- 7am, please page neurology on call as listed [place]

## 2015-04-16 NOTE — ED Provider Notes (Signed)
CSN: [id number]     Arrival date & time 04/16/15  1155 History   First MD Initiated Contact with Patient 04/16/15 1155     Chief Complaint  Patient presents with  . Dizziness  . Post-op Problem     (Consider location/radiation/quality/duration/timing/severity/associated sxs/prior Treatment) Patient is a 64 y.o. female presenting with dizziness.  Dizziness Quality:  Room spinning Severity:  Mild Onset quality:  Sudden Duration:  1 hour Timing:  Constant Progression:  Resolved Chronicity:  New Context: head movement   Context: not when bending over   Relieved by:  None tried Ineffective treatments:  None tried Associated symptoms: no chest pain, no nausea, no shortness of breath and no syncope     History reviewed. No pertinent past medical history. Past Surgical History  Procedure Laterality Date  . Tonsillectomy    . Hand surgery    . Shoulder surgery    . Knee surgery     No family history on file. Social History  Substance Use Topics  . Smoking status: Never Smoker   . Smokeless tobacco: None  . Alcohol Use: Yes   OB History    No data available     Review of Systems  Constitutional: Negative for fever and chills.  HENT: Negative for congestion.   Eyes: Negative for photophobia and pain.  Respiratory: Negative for cough and shortness of breath.   Cardiovascular: Negative for chest pain and syncope.  Gastrointestinal: Negative for nausea and abdominal pain.  Endocrine: Negative for polydipsia and polyuria.  Genitourinary: Negative for dysuria, pelvic pain and dyspareunia.  Musculoskeletal: Negative for myalgias, back pain and neck pain.  Skin: Positive for wound. Negative for pallor.  Neurological: Positive for dizziness. Negative for seizures.  Psychiatric/Behavioral: Negative for confusion and agitation.  All other systems reviewed and are negative.     Allergies  Ibuprofen  Home Medications   Prior to Admission medications   Medication Sig  Start Date End Date Taking? Authorizing Provider  cephALEXin (KEFLEX) 500 MG capsule Take 1 capsule (500 mg total) by mouth 4 (four) times daily. Patient not taking: Reported on 04/16/2015 03/28/13   Carmelina Dane, MD  clindamycin (CLEOCIN) 150 MG capsule Take 1 capsule (150 mg total) by mouth 3 (three) times daily. Patient not taking: Reported on 04/16/2015 03/28/13   Carmelina Dane, MD  HYDROcodone-acetaminophen [facility]) 5-325 MG per tablet Take 1-2 tablets by mouth every 4 (four) hours as needed for pain. Patient not taking: Reported on 04/16/2015 03/28/13   Carmelina Dane, MD   BP 136/79 mmHg  Pulse 71  Temp(Src) 97.7 F (36.5 C) (Oral)  Resp 23  Ht  (1.651 m)  Wt 180 lb (81.647 kg)  BMI 29.95 kg/m2  SpO2 99% Physical Exam  Constitutional: She is oriented to person, place, and time. She appears well-developed and well-nourished.  HENT:  Head: Normocephalic and atraumatic.  Eyes: Conjunctivae and EOM are normal. Right eye exhibits no discharge. Left eye exhibits no discharge.  Cardiovascular: Normal rate and regular rhythm.   Pulmonary/Chest: Effort normal and breath sounds normal. No respiratory distress.  Abdominal: Soft. She exhibits no distension. There is no tenderness. There is no rebound.  Musculoskeletal: Normal range of motion. She exhibits no edema or tenderness.  Neurological: She is alert and oriented to person, place, and time.  No altered mental status, able to give full seemingly accurate history.  Face is symmetric, EOM's intact, pupils equal and reactive, vision intact, tongue and uvula midline without deviation  Upper and Lower extremity motor 5/5, intact pain perception in distal extremities, 2+ reflexes in biceps, patella and achilles tendons. Finger to nose normal, heel to shin normal. Walks without assistance or evident ataxia.   Skin: Skin is warm and dry.  Nursing note and vitals reviewed.   ED Course  Procedures (including critical care  time) Labs Review Labs Reviewed  COMPREHENSIVE METABOLIC PANEL - Abnormal; Notable for the following:    Total Protein 6.3 (*)    All other components within normal limits  CBC WITH DIFFERENTIAL/PLATELET  TSH  VITAMIN B12    Imaging Review Ct Head Wo Contrast  04/16/2015   CLINICAL DATA:  Dizziness beginning suddenly this morning, RIGHT hand swelling post RIGHT hand surgery 1 week ago, question stroke  EXAM: CT HEAD WITHOUT CONTRAST  TECHNIQUE: Contiguous axial images were obtained from the base of the skull through the vertex without intravenous contrast.  COMPARISON:  None  FINDINGS: Normal ventricular morphology.  No midline shift or mass effect.  Normal appearance of brain parenchyma.  No intracranial hemorrhage, mass lesion, or acute infarction.  Visualized paranasal sinuses and mastoid air cells clear.  Bones unremarkable.  Minimal atherosclerotic calcification at the carotid siphons bilaterally.  IMPRESSION: No acute intracranial abnormalities.   Electronically Signed   By: Ulyses Southward M.D.   On: 04/16/2015 13:37   I have personally reviewed and evaluated these images and lab results as part of my medical decision-making.   EKG Interpretation   Date/Time:  Saturday April 16 2015 11:58:24 EDT Ventricular Rate:  67 PR Interval:  161 QRS Duration: 78 QT Interval:  447 QTC Calculation: 472 R Axis:   64 Text Interpretation:  Sinus rhythm Confirmed by [facility] MD, Barbara Cower [phone])  on 04/16/2015 12:02:23 PM      MDM   Final diagnoses:  Transient cerebral ischemia, unspecified transient cerebral ischemia type    patient is here with 2 separate complaints; she had a 30 minute episode of vertigo earlier today where she had slowed speech and difficulty walking but no ataxia. This resolved relatively quickly and has not had any symptoms since then. For this and concerned that it could possibly be a TIA. Normal multiple things that make it seem more peripheral however she does have some  risk factors as well so CT scan was done which was negative. Discussed case with neurology and they will come see her give [place] further recommendations. Her second complaint is that she has some swelling in her right arm. She had a surgery on her right hand approximately 9 days ago and the swelling started this morning with swollen fingers and a swollen wrist. Some pain associated with that otherwise no problems. Exam is benign no evidence of infection or neurovascular compromise. Concern for possible DVT so we'll get ultrasound to evaluate. Discussed case with orthopedics if felt that it is not necessary need to see her and they will continue following her as an outpatient.  Neurology evaluated and pelvis was likely a TIA and we should admit to observation for further workup. Ultrasound was negative for DVT and I feel this is an appropriate workup for the wound on her hand.  Spoke with family medicine who will admit.    Marily Memos, MD 04/16/15 1723

## 2015-04-16 NOTE — ED Notes (Signed)
Pt presents via [facility] for c/o dizziness and right hand swelling.  Pt was sitting earlier this AM when she became dizzy suddenly, denies N/V/SOB/CP/diaphoresis, no LOC.  Pt states the room is spinning, ambulatory on arrival.  Pt also had right hand surgery 1 week ago and was concerned with the swelling, per pt ortho MD was notified and suppose to meet pt in ED.  BP-122/86 P-60 NSR O2-96%RA, - orthos, EKG unremarkable.  Pt a x 4, NAD.

## 2015-04-17 ENCOUNTER — Inpatient Hospital Stay (HOSPITAL_COMMUNITY): Payer: Medicare Other

## 2015-04-17 ENCOUNTER — Inpatient Hospital Stay (HOSPITAL_BASED_OUTPATIENT_CLINIC_OR_DEPARTMENT_OTHER): Payer: Medicare Other

## 2015-04-17 DIAGNOSIS — G458 Other transient cerebral ischemic attacks and related syndromes: Secondary | ICD-10-CM

## 2015-04-17 DIAGNOSIS — G459 Transient cerebral ischemic attack, unspecified: Secondary | ICD-10-CM | POA: Diagnosis not present

## 2015-04-17 LAB — LIPID PANEL
CHOL/HDL RATIO: 7.6 ratio
Cholesterol: 365 mg/dL — ABNORMAL HIGH (ref 0–200)
HDL: 48 mg/dL (ref >40)
LDL Cholesterol: UNDETERMINED mg/dL (ref 0–99)
Triglycerides: 473 mg/dL — ABNORMAL HIGH (ref <150)
VLDL: UNDETERMINED mg/dL (ref 0–40)

## 2015-04-17 NOTE — Progress Notes (Signed)
VASCULAR LAB PRELIMINARY  PRELIMINARY  PRELIMINARY  PRELIMINARY  Carotid duplex completed.    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  There is 1-39% left ICA stenosis.  Vertebral artery flow is antegrade.   Dyana Magner, RVT 04/17/2015, 9:58 AM

## 2015-04-17 NOTE — Progress Notes (Signed)
Physical Therapy Vestibular Assessment Data   04/17/15 1940  Symptom Behavior  Type of Dizziness Imbalance (also c/o spinning)  Frequency of Dizziness throughout day with movement  Duration of Dizziness seconds to minutes  Aggravating Factors Turning head quickly;Supine to sit  Relieving Factors Head stationary;Lying supine;Slow movements  Occulomotor Exam  Occulomotor Alignment Normal  Spontaneous Absent  Gaze-induced Absent  Head shaking Horizontal Absent  Head Shaking Vertical Absent  Smooth Pursuits Intact  Saccades Intact  Vestibulo-Occular Reflex  VOR 1 Head Only (x 1 viewing) Causes dizziness/nausea horizontally and vertically  Comment Positive Head Thrust to both sides  Positional Testing  Dix-Hallpike Dix-Hallpike Right;Dix-Hallpike Left  Horizontal Canal Testing Horizontal Canal Right;Horizontal Canal Left  Dix-Hallpike Right  Dix-Hallpike Right Duration 0  Dix-Hallpike Right Symptoms No nystagmus  Dix-Hallpike Left  Dix-Hallpike Left Duration 0  Dix-Hallpike Left Symptoms No nystagmus  Horizontal Canal Right  Horizontal Canal Right Duration 0  Horizontal Canal Right Symptoms Normal  Horizontal Canal Left  Horizontal Canal Left Duration 0  Horizontal Canal Left Symptoms Normal  Positional Sensitivities  Supine to Sitting 2  Right Hallpike 0  Head Turning x 5 2  Head Nodding x 5 3  [name], [name] Pager [phone]

## 2015-04-17 NOTE — Evaluation (Signed)
Physical Therapy Evaluation Patient Details Name: [name] MRN: [id number] DOB: 11-26-1950 Today's Date: 04/17/2015   History of Present Illness  Pt 64 y.o. female presenting with transient staggering gait, dizziness, dysarthria, and mild confusion with progressive memory issues over the past 6-12 months. PMH is significant for recent right hand surgery for Dupuytren's contracture. CT/MRA/MRI revealed No acute intracranial abnormalities.  Clinical Impression  Patient with c/o dizziness with movements.  Please see Vestibular Assessment note for details.  Patient tested negative for BPPV.  Did have positive head thrust to both sides.  Patient with dizziness and nausea with quick head movements.  Instructed in x1 exercises.  Will return tomorrow for continued assessment and treatment.  Would recommend OP PT for Vestibular Rehab/Balance at discharge for continued therapy.    Follow Up Recommendations Supervision for mobility/OOB;Outpatient PT (for Vestibular Rehab)    Equipment Recommendations  Other (comment) (TBD)    Recommendations for Other Services       Precautions / Restrictions Precautions Precautions: Fall Precaution Comments: dizziness Restrictions Other Position/Activity Restrictions: s/p right hand surgery for Dupuytren's contracture. Pt reports she has a splint she wears at night and is not supposed to get incision wet. Pt reports she does not use right hand to WB.       Mobility  Bed Mobility Overal bed mobility: Modified Independent             General bed mobility comments: Increased time   Transfers                    Ambulation/Gait                Stairs            Wheelchair Mobility    Modified Rankin (Stroke Patients Only) Modified Rankin (Stroke Patients Only) Pre-Morbid Rankin Score: No symptoms Modified Rankin: Slight disability     Balance                                              Pertinent Vitals/Pain Pain Assessment: 0-10 Pain Score: 3  Pain Location: Rt hand Pain Descriptors / Indicators: Aching;Sore Pain Intervention(s): Monitored during session;Repositioned    Home Living Family/patient expects to be discharged to:: Private residence Living Arrangements: Spouse/significant other Available Help at Discharge: Family;Available PRN/intermittently (Husband works) Type of Home: House Home Access: Stairs to enter Entrance Stairs-Rails: Left Entrance Stairs-Number of Steps: 4 Home Layout: Two level;Able to live on main level with bedroom/bathroom Home Equipment: Shower seat - built in      Prior Function Level of Independence: Independent               Hand Dominance   Dominant Hand: Left    Extremity/Trunk Assessment   Upper Extremity Assessment: Defer to OT evaluation           Lower Extremity Assessment: Overall WFL for tasks assessed      Cervical / Trunk Assessment: Normal  Communication   Communication: No difficulties  Cognition Arousal/Alertness: Awake/alert ("sleepy") Behavior During Therapy: WFL for tasks assessed/performed (Per chart, some issues with confusion pta.) Overall Cognitive Status: Within Functional Limits for tasks assessed (Doesn't remember events of admission)       Memory: Decreased short-term memory (Unable to recall steps to activity completed 10 min prior)  General Comments General comments (skin integrity, edema, etc.): Vestibular Evaluation initiated.  See Vest Assessment note    Exercises Other Exercises Other Exercises: x1 exercises, in sitting, horizontally.      Assessment/Plan    PT Assessment Patient needs continued PT services  PT Diagnosis Difficulty walking;Generalized weakness;Altered mental status (Dizziness)   PT Problem List Decreased strength;Decreased activity tolerance;Decreased balance;Decreased mobility;Pain (Dizziness)  PT Treatment Interventions DME  instruction;Gait training;Functional mobility training;Therapeutic activities;Therapeutic exercise;Balance training;Patient/family education   PT Goals (Current goals can be found in the Care Plan section) Acute Rehab PT Goals Patient Stated Goal: None stated PT Goal Formulation: With patient/family Time For Goal Achievement: 04/24/15 Potential to Achieve Goals: Good    Frequency Min 4X/week   Barriers to discharge Decreased caregiver support Does not have 24 hour assist    Co-evaluation               End of Session   Activity Tolerance: Patient limited by fatigue (Limited by dizziness and nausea) Patient left: in bed;with call bell/phone within reach;with family/visitor present      Functional Assessment Tool Used: Clinical judgement Functional Limitation: Mobility: Walking and moving around Mobility: Walking and Moving Around Current Status (J1914): At least 1 percent but less than 20 percent impaired, limited or restricted Mobility: Walking and Moving Around Goal Status [phone]): At least 1 percent but less than 20 percent impaired, limited or restricted    Time: 6213-0865 PT Time Calculation (min) (ACUTE ONLY): 37 min   Charges:   PT Evaluation $Initial PT Evaluation Tier I: 1 Procedure PT Treatments $Therapeutic Activity: 8-22 mins   PT G Codes:   PT G-Codes **NOT FOR INPATIENT CLASS** Functional Assessment Tool Used: Clinical judgement Functional Limitation: Mobility: Walking and moving around Mobility: Walking and Moving Around Current Status (H8469): At least 1 percent but less than 20 percent impaired, limited or restricted Mobility: Walking and Moving Around Goal Status [phone]): At least 1 percent but less than 20 percent impaired, limited or restricted    Vena Austria 04/17/2015, 7:59 PM Durenda Hurt. Renaldo Fiddler, [facility] Rehab Services Pager [phone]

## 2015-04-17 NOTE — Progress Notes (Signed)
STROKE TEAM PROGRESS NOTE  HPI [name] is a 64 y.o. female who is believed to be healthy but has not had any regular medical follow-up for several years. She had surgery on her right hand approximately one week ago performed by Dr. Amanda Pea for a Dupuytren's contracture. The patient's daughter drove over to her mother's house this morning at around 9:30 to pick up something. Her mother walked out of the house towards the car and her daughter noted that her mother appeared to be staggering. She helped her mother back  in to the house, sat her down, and called 911. The daughter noticed some mild dysarthria and said that this was also noted by the EMTs.She also felt that her mother was mildly confused. The patient felt as if the room was spinning.  The patient herself does not remember much about the incident. She remembers working in the house and getting ready to go out to the car; however, she is foggy about the details. The patient's speech was back to normal in the emergency department, although she still seemed to have some mild cognitive difficulties. When her daughter tried to help her mother get up and go to the bathroom in the ER the patient was once again dizzy. She does not feel dizzy lying down. The patient's daughter estimates that the worst of these symptoms lasted approximately 2 hours. The patient denies any recent headaches, palpitations, nausea or vomiting, visual changes, speech difficulties, changes in hearing, abnormal sounds or ringing in her ears.  The patient's husband and daughter both report that the patient's memory has been getting progressively worse over the past 6 months to one year. She had been taking aspirin 81 mg daily but this was recently held for her hand surgery.  LKW: unclear, but prior to 9:30    SUBJECTIVE (INTERVAL HISTORY) The patient's husband and daughter at the bedside. The patient is feeling better today. She seems more alert and mentally clear.  Dizziness resolved.    OBJECTIVE Temp:  [97.7 F (36.5 C)-98.6 F (37 C)] 98.1 F (36.7 C) (08/28 0515) Pulse Rate:  [61-108] 61 (08/28 0515) Cardiac Rhythm:  [-] Normal sinus rhythm (08/27 2006) Resp:  [13-23] 18 (08/28 0515) BP: (110-157)/(43-93) 138/76 mmHg (08/28 0515) SpO2:  [93 %-100 %] 96 % (08/28 0515) Weight:  [81.647 kg (180 lb)] 81.647 kg (180 lb) (08/27 1159)  No results found for: CHOL, TRIG, HDL, CHOLHDL, VLDL, LDLCALC No results found for: HGBA1C No results found for: LABOPIA, COCAINSCRNUR, LABBENZ, AMPHETMU, THCU, LABBARB   Basic Metabolic Panel:  Recent Labs Lab 04/16/15 1331  NA 140  K 3.9  CL 105  CO2 27  GLUCOSE 96  BUN 12  CREATININE 0.82  CALCIUM 9.4   CBC:  Recent Labs Lab 04/16/15 1331  WBC 6.5  NEUTROABS 3.9  HGB 13.4  HCT 40.9  MCV 89.3  PLT 182   Coagulation: No results for input(s): LABPROT, INR in the last 168 hours.  IMAGING  Ct Head Wo Contrast 04/16/2015    No acute intracranial abnormalities.     Mr Maxine Glenn Head/brain Wo Cm 04/17/2015    MRI HEAD:  No acute intracranial process; normal noncontrast MRI of the brain for age.    MRA HEAD:  No acute large vessel occlusion or high-grade stenosis ; complete circle of Willis.      PHYSICAL EXAM General - pleasant 64 year old female alert and in no acute distress Heart - Regular rate and rhythm - no murmer noted  Lungs - Clear to auscultation Extremities - Distal pulses intact - no edema. The patient is wearing a brace on her right upper extremity. Skin - Warm and dry  Mental Status: Alert, oriented, thought content appropriate.  Speech fluent without evidence of aphasia.  Able to follow 3 step commands without difficulty. Mild memory difficulties noted. Able to recall 2 of 3 objects after 1 minute. Able to name approximately 5 or leg at animals. Accurately performed simple math calculations. Cranial Nerves: II: Visual fields grossly normal, pupils equal, round, reactive to  light and accommodation III,IV, VI: ptosis not present, extra-ocular motions intact bilaterally V,VII: smile symmetric, facial light touch sensation normal bilaterally VIII: hearing normal bilaterally IX,X: gag reflex present XI: bilateral shoulder shrug XII: midline tongue extension Motor: Right : Upper extremity   5/5    Left:     Upper extremity   5/5  Lower extremity   5/5     Lower extremity   5/5 Tone and bulk:normal tone throughout; no atrophy noted Sensory: light touch intact throughout, bilaterally Cerebellar: normal finger-to-nose, normal rapid alternating movements and normal heel-to-shin test Gait: Deferred    ASSESSMENT/PLAN [name] is a 64 y.o. female with history of his tobacco history, psoriasis, and recent right hand surgery presenting with transient staggering gait, dizziness, dysarthria, and mild confusion with progressive memory issues over the past 6-12 months.  She did not receive IV t-PA due to late presentation and unclear time of onset.  TIA:  Dominant - unknown etiology  Resultant - very mild cognitive deficits.  MRI  no acute process  MRA  no large vessel occlusion or high-grade stenosis  Carotid Doppler - There is 40-59% right stenosis. There is 1-39% left ICA stenosis. Vertebral artery flow is antegrade.   2D Echo  pending  LDL total cholesterol 365; triglycerides 473; LDL could not be calculated.  HgbA1c pending  Vit B12 - 349 - WNL  TSH - 2.531 - WNL  Lovenox for VTE prophylaxis  Diet regular Room service appropriate?: Yes; Fluid consistency:: Thin  no antithrombotic prior to admission, now on aspirin 81 mg orally every day.   Patient was off her aspirin due to hand surgery when event occurred, so no need to change management at this time.  Patient counseled to be compliant with her antithrombotic medications  Ongoing aggressive stroke risk factor management  Therapy recommendations:  Pending  Disposition:   Pending  Hypertension  Stable  Permissive hypertension (OK if < 220/120) but gradually normalize in 5-7 days  Hyperlipidemia  Home meds: No lipid lowering medications prior to admission  LDL as above, goal < 70  Recommend Lipitor 80 mg daily. Consider fenofibrate  Continue statin at discharge  Diabetes  HgbA1c pending goal < 7.0  No previous history of diabetes mellitus  Other Stroke Risk Factors  Advanced age  Cigarette smoker, quit smoking 2005 or 2006. (30-40 pack year history)  Obesity, Body mass index is 29.95 kg/(m^2).    Other Active Problems  Progressive memory problems   PLAN  Await 2-D echocardiogram, hemoglobin A1c, and therapy evaluations  Follow-up Dr. Lucia Gaskins after discharge  Continue aspirin 81 mg daily  Personally examined patient and images, and have participated in and made any corrections needed to history, physical, neuro exam,assessment and plan as stated above.  Naomie Dean, MD Stroke Neurology [phone] [facility]      To contact Stroke Continuity provider, please refer to [email or web address]. After hours, contact General Neurology

## 2015-04-17 NOTE — Progress Notes (Signed)
Family Medicine Teaching Service Daily Progress Note Intern Pager: [phone]  Patient name: [name] Medical record number: [id number] Date of birth: June 30, 1951 Age: 64 y.o. Gender: female  Primary Care Provider: No primary care provider on file. Consultants: Neuro Code Status: Full  Pt Overview and Major Events to Date:  8/27: Admit to Telemetry; CT head: no acute intracranial abnormalities 8/28: MRI & MRA: no acute intracranial process, no acute large vessel occlusion or high grade stenosis   Assessment and Plan: [name] is a 64 y.o. female presenting with dizziness . PMH is significant for recent right hand surgery for Dupuytren's contracture, psoriasis, history of smoking  # TIA: symptoms lasted <2 hours. Seen by neuro in ED. CT head negative. Lab workup and EKG unremarkable. Risk factor = 40 pack year smoking history, quit 2007. DDx would also include seizure. MRI and MRA negative. Lipid Panel: Cholesterol 365, Triglycerides 473, HDL 48, unable to calculate LDL. 2x avg risk for CHD. ASCVD without resulted HgBA1C 10 yr risk= 8.3% - telemetry - RN stroke screen - HgB A1C pending  - per neuro recs added B12 for some concern of continued confusion - PT/OT/SLP eval, will also have PT vestibular eval -consider statin  -echo and carotid dopplers pending   # Dupuytren contracture: ortho consulted by phone in ED for some concern of right upper arm swelling (same side as hand surgery). Duplex ultrasound obtained in ED was negative for upper extremity DVT. Patient states swelling improving, mostly swollen in only fingers now.  - monitor clinically - do not feel ortho needs to evaluate patient at this time while in the hospital  # Psoriasis: stable - monitor  FEN/GI: regular diet / saline lock Prophylaxis: lovenox   Disposition: telemetry   Subjective:  Patient states that she is doing well. Daughter says mom is mostly back to baseline cognitive functioning. Patient did  forget home phone number this morning, but has not had other episodes of confusion.   Objective: Temp:  [97.7 F (36.5 C)-98.6 F (37 C)] 98.1 F (36.7 C) (08/28 0515) Pulse Rate:  [61-108] 61 (08/28 0515) Resp:  [13-23] 18 (08/28 0515) BP: (110-157)/(43-93) 138/76 mmHg (08/28 0515) SpO2:  [93 %-100 %] 96 % (08/28 0515) Weight:  [180 lb (81.647 kg)] 180 lb (81.647 kg) (08/27 1159) Physical Exam: General: Lying in bed in NAD  Cardiovascular: RRR, no murmurs appreciated. No LE edema.  Respiratory: CTAB, non-labored breathing  Abdomen: soft, NT, ND  Neuro: A&O x3. CN II-XII grossly intact. Normal strength in L UE, unable to test on R. Normal speech. Psych: Mood normal. Normal thought content.   Laboratory:  Recent Labs Lab 04/16/15 1331  WBC 6.5  HGB 13.4  HCT 40.9  PLT 182    Recent Labs Lab 04/16/15 1331  NA 140  K 3.9  CL 105  CO2 27  BUN 12  CREATININE 0.82  CALCIUM 9.4  PROT 6.3*  BILITOT 0.6  ALKPHOS 107  ALT 30  AST 23  GLUCOSE 96   Cholesterol 0 - 200 mg/dL 147 (H)   Triglycerides <150 mg/dL 829 (H)   HDL >56 mg/dL 48   Total CHOL/HDL Ratio RATIO 7.6   VLDL 0 - 40 mg/dL UNABLE TO CALCULATE IF TRIGLYCERIDE OVER 400 mg/dL   LDL Cholesterol 0 - 99 mg/dL UNABLE TO CALCULATE IF TRIGLYCERIDE OVER 400 mg/dL          Imaging/Diagnostic Tests: Ct Head Wo Contrast  04/16/2015   CLINICAL DATA:  Dizziness  beginning suddenly this morning, RIGHT hand swelling post RIGHT hand surgery 1 week ago, question stroke  EXAM: CT HEAD WITHOUT CONTRAST  TECHNIQUE: Contiguous axial images were obtained from the base of the skull through the vertex without intravenous contrast.  COMPARISON:  None  FINDINGS: Normal ventricular morphology.  No midline shift or mass effect.  Normal appearance of brain parenchyma.  No intracranial hemorrhage, mass lesion, or acute infarction.  Visualized paranasal sinuses and mastoid air cells clear.  Bones unremarkable.  Minimal  atherosclerotic calcification at the carotid siphons bilaterally.  IMPRESSION: No acute intracranial abnormalities.   Electronically Signed   By: Ulyses Southward M.D.   On: 04/16/2015 13:37   Mr Brain Wo Contrast  04/17/2015   CLINICAL DATA:  Gait abnormality beginning yesterday morning, confusion, dizziness. Hand surgery 1 week ago. Assess transient ischemic attack.  EXAM: MRI HEAD WITHOUT CONTRAST  MRA HEAD WITHOUT CONTRAST  TECHNIQUE: Multiplanar, multiecho pulse sequences of the brain and surrounding structures were obtained without intravenous contrast. Angiographic images of the head were obtained using MRA technique without contrast.  COMPARISON:  CT head April 16, 2015  FINDINGS: MRI HEAD FINDINGS  The ventricles and sulci are normal for patient's age. No abnormal parenchymal signal, mass lesions, mass effect. No reduced diffusion to suggest acute ischemia. No susceptibility artifact to suggest hemorrhage.  No abnormal extra-axial fluid collections. No extra-axial masses though, contrast enhanced sequences would be more sensitive. Normal major intracranial vascular flow voids seen at the skull base.  Ocular globes and orbital contents are unremarkable though not tailored for evaluation. No abnormal sellar expansion. Visualized paranasal sinuses and mastoid air cells are well-aerated, LEFT concha bullosa. No suspicious calvarial bone marrow signal. No abnormal sellar expansion. Craniocervical junction maintained.  MRA HEAD FINDINGS  Anterior circulation: Normal flow related enhancement of the included cervical, petrous, cavernous and supra clinoid internal carotid arteries. Very mild luminal irregularity LEFT cavernous carotid artery corresponding to calcific atherosclerosis on prior head CT. Patent anterior communicating artery. Normal flow related enhancement of the anterior and middle cerebral arteries, including more distal segments.  No large vessel occlusion, high-grade stenosis, abnormal luminal  irregularity, aneurysm.  Posterior circulation: LEFT vertebral artery is dominant. Basilar artery is patent, with normal flow related enhancement of the main branch vessels. Fetal origin RIGHT posterior cerebral artery. Small LEFT posterior communicating artery present. Normal flow related enhancement of the posterior cerebral arteries.  No large vessel occlusion, high-grade stenosis, abnormal luminal irregularity, aneurysm.  IMPRESSION: MRI HEAD: No acute intracranial process; normal noncontrast MRI of the brain for age.  MRA HEAD: No acute large vessel occlusion or high-grade stenosis ; complete circle of Willis.   Electronically Signed   By: Awilda Metro M.D.   On: 04/17/2015 02:41   Mr Maxine Glenn Head/brain Wo Cm  04/17/2015   CLINICAL DATA:  Gait abnormality beginning yesterday morning, confusion, dizziness. Hand surgery 1 week ago. Assess transient ischemic attack.  EXAM: MRI HEAD WITHOUT CONTRAST  MRA HEAD WITHOUT CONTRAST  TECHNIQUE: Multiplanar, multiecho pulse sequences of the brain and surrounding structures were obtained without intravenous contrast. Angiographic images of the head were obtained using MRA technique without contrast.  COMPARISON:  CT head April 16, 2015  FINDINGS: MRI HEAD FINDINGS  The ventricles and sulci are normal for patient's age. No abnormal parenchymal signal, mass lesions, mass effect. No reduced diffusion to suggest acute ischemia. No susceptibility artifact to suggest hemorrhage.  No abnormal extra-axial fluid collections. No extra-axial masses though, contrast enhanced sequences would be  more sensitive. Normal major intracranial vascular flow voids seen at the skull base.  Ocular globes and orbital contents are unremarkable though not tailored for evaluation. No abnormal sellar expansion. Visualized paranasal sinuses and mastoid air cells are well-aerated, LEFT concha bullosa. No suspicious calvarial bone marrow signal. No abnormal sellar expansion. Craniocervical junction  maintained.  MRA HEAD FINDINGS  Anterior circulation: Normal flow related enhancement of the included cervical, petrous, cavernous and supra clinoid internal carotid arteries. Very mild luminal irregularity LEFT cavernous carotid artery corresponding to calcific atherosclerosis on prior head CT. Patent anterior communicating artery. Normal flow related enhancement of the anterior and middle cerebral arteries, including more distal segments.  No large vessel occlusion, high-grade stenosis, abnormal luminal irregularity, aneurysm.  Posterior circulation: LEFT vertebral artery is dominant. Basilar artery is patent, with normal flow related enhancement of the main branch vessels. Fetal origin RIGHT posterior cerebral artery. Small LEFT posterior communicating artery present. Normal flow related enhancement of the posterior cerebral arteries.  No large vessel occlusion, high-grade stenosis, abnormal luminal irregularity, aneurysm.  IMPRESSION: MRI HEAD: No acute intracranial process; normal noncontrast MRI of the brain for age.  MRA HEAD: No acute large vessel occlusion or high-grade stenosis ; complete circle of Willis.   Electronically Signed   By: Awilda Metro M.D.   On: 04/17/2015 02:41    Arvilla Market, DO 04/17/2015, 9:00 AM PGY-1, [facility] Family Medicine FPTS Intern pager: [phone], text pages welcome

## 2015-04-17 NOTE — Progress Notes (Signed)
Occupational Therapy Evaluation Patient Details Name: [name] MRN: [id number] DOB: [birth year] Today's Date: 04/17/2015    History of Present Illness Pt 64 y.o. female presenting with transient staggering gait, dizziness, dysarthria, and mild confusion with progressive memory issues over the past 6-12 months. PMH is significant for recent right hand surgery for Dupuytren's contracture. CT/MRA/MRI revealed No acute intracranial abnormalities.   Clinical Impression   Pt admitted with the above diagnoses and presents with below problem list. Pt will benefit from continued acute OT to address the below listed deficits and maximize independence with BADLs prior to d/c home with family. PTA pt was independent ADLs (likely min A the past week due to Rt hand surgery). Pt is currently supervision to min guard for balance aspect of transfers, functional mobility and LB ADLs due to dizziness and nausea as detailed below. OT to continue to follow acutely. Pt also with RUE swelling noted during session.     Follow Up Recommendations  Supervision - Intermittent;No OT follow up    Equipment Recommendations  None recommended by OT    Recommendations for Other Services PT consult     Precautions / Restrictions Precautions Precautions: Fall Restrictions Other Position/Activity Restrictions: s/p right hand surgery for Dupuytren's contracture. Pt reports she has a splint she wears at night and is not supposed to get incision wet. Pt reports she does not use right hand to WB.       Mobility Bed Mobility Overal bed mobility: Modified Independent                Transfers Overall transfer level: Needs assistance Equipment used: None Transfers: Sit to/from Stand Sit to Stand: Supervision         General transfer comment: from EOB, regular height toilet; supervision for safety due to dizziness    Balance Overall balance assessment: Needs assistance Sitting-balance support: No  upper extremity supported;Feet supported Sitting balance-Leahy Scale: Good     Standing balance support: No upper extremity supported;During functional activity Standing balance-Leahy Scale: Fair Standing balance comment: Pt with noted sway during head turns whiloe ambulating. Increased time, cautious on turns.                            ADL Overall ADL's : Needs assistance/impaired Eating/Feeding: Set up;Sitting   Grooming: Oral care;Standing;Set up;Supervision/safety   Upper Body Bathing: Minimal assitance;Sitting Upper Body Bathing Details (indicate cue type and reason): due to recent hand surgery Lower Body Bathing: Minimal assistance;Sit to/from stand Lower Body Bathing Details (indicate cue type and reason): min A due to recent hand surgery, supervision for balance Upper Body Dressing : Minimal assistance;Sitting   Lower Body Dressing: Minimal assistance;Sit to/from stand Lower Body Dressing Details (indicate cue type and reason): min A due to recent hand surgery, supervision for balance Toilet Transfer: Supervision/safety;Ambulation;Regular Toilet;Grab bars   Toileting- Clothing Manipulation and Hygiene: Supervision/safety;Sit to/from stand   Tub/ Shower Transfer: Walk-in shower;Supervision/safety;Min guard;Ambulation;Shower seat   Functional mobility during ADLs: Min guard;Supervision/safety General ADL Comments: Pt presents with some decreased dynamic balance due to dizziness. Pt is currently min A for most ADLs due to recent right hand surgery on week prior. Pt is supervision for transfers and in-room ambulation; min guard for longer distances. Pt with some mild swaying with head turns and during turning while ambulating household distances. Pt c/o nausea (no emesis) at end of session following walking in the hallway with head turns incorporated.  Vision Vision Assessment?: No apparent visual deficits   Perception     Praxis      Pertinent Vitals/Pain  Pain Assessment: 0-10 Pain Score:  (no value given) Pain Location: right shoulder Pain Intervention(s): Monitored during session;Limited activity within patient's tolerance     Hand Dominance Left   Extremity/Trunk Assessment Upper Extremity Assessment Upper Extremity Assessment: RUE deficits/detail RUE Deficits / Details: s/p right surgical repair of Dupuytren's contracture on 04/08/15   Lower Extremity Assessment Lower Extremity Assessment: Defer to PT evaluation       Communication Communication Communication: No difficulties   Cognition Arousal/Alertness: Awake/alert Behavior During Therapy: WFL for tasks assessed/performed Overall Cognitive Status: Within Functional Limits for tasks assessed                     General Comments    Pt noted to have some RUE swelling shoulder and distal.    Exercises       Shoulder Instructions      Home Living Family/patient expects to be discharged to:: Private residence Living Arrangements: Spouse/significant other Available Help at Discharge: Family Type of Home: House Home Access: Stairs to enter Secretary/administrator of Steps: 4 Entrance Stairs-Rails: Left Home Layout: Two level;Able to live on main level with bedroom/bathroom     Bathroom Shower/Tub: Walk-in shower         Home Equipment: Shower seat - built in   Additional Comments: Pt lives with spouse who works during the day. Daughter lives "right around the corner."      Prior Functioning/Environment Level of Independence: Independent             OT Diagnosis: Other (comment) (dizziness, impaired balance)   OT Problem List: Decreased activity tolerance;Impaired balance (sitting and/or standing);Decreased knowledge of use of DME or AE;Decreased knowledge of precautions   OT Treatment/Interventions: Self-care/ADL training;DME and/or AE instruction;Therapeutic activities;Patient/family education;Balance training    OT Goals(Current goals can be  found in the care plan section) Acute Rehab OT Goals Patient Stated Goal: not stated OT Goal Formulation: With patient/family Time For Goal Achievement: 04/24/15 Potential to Achieve Goals: Good ADL Goals Pt Will Perform Grooming: standing;sitting;with modified independence (balance) Pt Will Perform Lower Body Bathing: with modified independence;sit to/from stand Pt Will Perform Lower Body Dressing: with modified independence;sit to/from stand Pt Will Transfer to Toilet: Independently;regular height toilet;grab bars Pt Will Perform Toileting - Clothing Manipulation and hygiene: Independently;sit to/from stand Pt Will Perform Tub/Shower Transfer: Shower transfer;Independently;shower seat  OT Frequency: Min 2X/week   Barriers to D/C:            Co-evaluation              End of Session Nurse Communication: Other (comment) (dizziness, mild nausea, RUE swelling)  Activity Tolerance: Patient tolerated treatment well;Other (comment) (dizziness OOB, mild nausea after ambulating in halls ) Patient left: in bed;with call bell/phone within reach;with family/visitor present   Time: 1252-1315 OT Time Calculation (min): 23 min Charges:  OT General Charges $OT Visit: 1 Procedure OT Evaluation $Initial OT Evaluation Tier I: 1 Procedure OT Treatments $Self Care/Home Management : 8-22 mins G-Codes: OT G-codes **NOT FOR INPATIENT CLASS** Functional Assessment Tool Used: clinical judgement Functional Limitation: Self care Self Care Current Status (Q5956): At least 1 percent but less than 20 percent impaired, limited or restricted Self Care Goal Status (L8756): 0 percent impaired, limited or restricted  Pilar Grammes 04/17/2015, 1:48 PM

## 2015-04-18 ENCOUNTER — Observation Stay (HOSPITAL_BASED_OUTPATIENT_CLINIC_OR_DEPARTMENT_OTHER): Payer: Medicare Other

## 2015-04-18 DIAGNOSIS — R0600 Dyspnea, unspecified: Secondary | ICD-10-CM

## 2015-04-18 DIAGNOSIS — R7303 Prediabetes: Secondary | ICD-10-CM | POA: Insufficient documentation

## 2015-04-18 DIAGNOSIS — R413 Other amnesia: Secondary | ICD-10-CM

## 2015-04-18 DIAGNOSIS — E785 Hyperlipidemia, unspecified: Secondary | ICD-10-CM | POA: Diagnosis not present

## 2015-04-18 DIAGNOSIS — G9340 Encephalopathy, unspecified: Secondary | ICD-10-CM | POA: Diagnosis not present

## 2015-04-18 LAB — HEMOGLOBIN A1C
Hgb A1c MFr Bld: 6 % — ABNORMAL HIGH (ref 4.8–5.6)
Mean Plasma Glucose: 126 mg/dL

## 2015-04-18 MED ORDER — ATORVASTATIN CALCIUM 80 MG PO TABS: 80.0000 mg | ORAL_TABLET | Freq: Every day | ORAL | Status: DC

## 2015-04-18 MED ORDER — ASPIRIN 81 MG PO TBEC: 325.0000 mg | DELAYED_RELEASE_TABLET | Freq: Every day | ORAL | Status: DC

## 2015-04-18 MED ORDER — ATORVASTATIN CALCIUM 40 MG PO TABS
40.0000 mg | ORAL_TABLET | Freq: Every day | ORAL | Status: DC
Start: 2015-04-18 — End: 2015-04-18

## 2015-04-18 MED ORDER — ASPIRIN EC 325 MG PO TBEC: 325.0000 mg | DELAYED_RELEASE_TABLET | Freq: Every day | ORAL | Status: DC

## 2015-04-18 MED ORDER — ASPIRIN 325 MG PO TBEC: 325.0000 mg | DELAYED_RELEASE_TABLET | Freq: Every day | ORAL | Status: DC

## 2015-04-18 MED ORDER — ATORVASTATIN CALCIUM 40 MG PO TABS
40.0000 mg | ORAL_TABLET | Freq: Every day | ORAL | Status: DC
  Administered 2015-04-18: 40 mg via ORAL
  Filled 2015-04-18: qty 1

## 2015-04-18 MED ORDER — ASPIRIN 81 MG PO TBEC: 81.0000 mg | DELAYED_RELEASE_TABLET | Freq: Every day | ORAL | Status: DC

## 2015-04-18 NOTE — Evaluation (Signed)
Speech Language Pathology Evaluation Patient Details Name: [name] MRN: [id number] DOB: [birth year] Today's Date: 04/18/2015 Time: 4098-1191 SLP Time Calculation (min) (ACUTE ONLY): 19 min  Problem List:  Patient Active Problem List   Diagnosis Date Noted  . Hyperlipidemia   . Prediabetes   . TIA (transient ischemic attack) 04/16/2015   Past Medical History:  Past Medical History  Diagnosis Date  . Psoriasis    Past Surgical History:  Past Surgical History  Procedure Laterality Date  . Tonsillectomy    . Hand surgery    . Shoulder surgery    . Knee surgery     HPI:  Pt 64 y.o. female presenting with transient staggering gait, dizziness, dysarthria, and mild confusion with progressive memory issues over the past 6-12 months. PMH is significant for recent right hand surgery for Dupuytren's contracture. CT/MRA/MRI revealed No acute intracranial abnormalities.   Assessment / Plan / Recommendation Clinical Impression  Patient presents with cognitive deficits in the areas of selective attention and working memory which family reports is baseline, slightly worse since admission with TIA. Therapeutic intervention provided including education regarding compensatory strategies to facilitate improved attention and memory. OP SLP treatment may be beneficial pending determination of origin of impairements as family reports progressive memory decline for 6-12 months. F/u with primary care physician recommended. No further skilled SLP treatment indicated.    SLP Assessment  All further Speech Lanaguage Pathology  needs can be addressed in the next venue of care    Follow Up Recommendations  Outpatient SLP       Pertinent Vitals/Pain Pain Assessment: No/denies pain       SLP Evaluation Prior Functioning  Cognitive/Linguistic Baseline: Baseline deficits Baseline deficit details: memory per family Type of Home: House  Lives With: Spouse Available Help at Discharge:  Family;Available PRN/intermittently   Cognition  Overall Cognitive Status: Difficult to assess (per family, memory deficits slightly worse since admission) Arousal/Alertness: Awake/alert Orientation Level: Oriented X4 Attention: Selective Selective Attention: Impaired Selective Attention Impairment: Verbal complex;Functional complex Memory: Impaired Memory Impairment: Storage deficit;Retrieval deficit;Decreased recall of new information Awareness: Appears intact Problem Solving: Appears intact Safety/Judgment: Appears intact    Comprehension  Auditory Comprehension Overall Auditory Comprehension: Appears within functional limits for tasks assessed Visual Recognition/Discrimination Discrimination: Within Function Limits Reading Comprehension Reading Status: Not tested    Expression Expression Primary Mode of Expression: Verbal Verbal Expression Overall Verbal Expression: Impaired (subtle word finding deficits) Written Expression Dominant Hand: Left   Oral / Motor Oral Motor/Sensory Function Overall Oral Motor/Sensory Function: Appears within functional limits for tasks assessed Motor Speech Overall Motor Speech: Appears within functional limits for tasks assessed   GO Functional Assessment Tool Used: skilled clinical judgement Functional Limitations: Memory Memory Current Status (Y7829): At least 20 percent but less than 40 percent impaired, limited or restricted Memory Goal Status (F6213): At least 20 percent but less than 40 percent impaired, limited or restricted Memory Discharge Status [phone]): At least 20 percent but less than 40 percent impaired, limited or restricted  [facility] MA, CCC-SLP [phone]  Ferdinand Lango Meryl 04/18/2015, 12:34 PM

## 2015-04-18 NOTE — Progress Notes (Signed)
Family Medicine Teaching Service Daily Progress Note Intern Pager: [phone]  Patient name: [name] Medical record number: [id number] Date of birth: [birth year] Age: 64 y.o. Gender: female  Primary Care Provider: No primary care provider on file. Consultants: Neuro Code Status: Full  Pt Overview and Major Events to Date:  8/27: Admit to Telemetry; CT head: no acute intracranial abnormalities 8/28: MRI & MRA: no acute intracranial process, no acute large vessel occlusion or high grade stenosis   Assessment and Plan: [name] is a 64 y.o. female presenting with dizziness . PMH is significant for recent right hand surgery for Dupuytren's contracture, psoriasis, history of smoking  # TIA: symptoms lasted <2 hours. Seen by neuro in ED. CT head negative. Lab workup and EKG unremarkable. Risk factor = 40 pack year smoking history, quit 2007. DDx would also include seizure. MRI and MRA negative. 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 1-39% left ICA stenosis.Vertebral artery flow is antegrade. Lipid Panel: Cholesterol 365, Triglycerides 473, HDL 48, unable to calculate LDL. 2x avg risk for CHD. ASCVD without resulted HgBA1C 10 yr risk= 8.3% - RN stroke screen - HgB A1C pending  - PT/OT/SLP eval, will need outpatient PT(vestibular eval) - Start atorvastatin 40 mg  - Echo pending    # Dupuytren contracture: ortho consulted by phone in ED for some concern of right upper arm swelling (same side as hand surgery). Duplex ultrasound obtained in ED was negative for upper extremity DVT. Patient states swelling improving, mostly swollen in only fingers now.  - monitor clinically - do not feel ortho needs to evaluate patient at this time while in the hospital  # Psoriasis: stable - monitor  FEN/GI: regular diet / saline lock Prophylaxis: lovenox   Disposition: telemetry   Subjective:  Patient doing well this morning. Ready to go home. No dizziness, confusion, and weakness.    Objective: Temp:  [98 F (36.7 C)-98.6 F (37 C)] 98 F (36.7 C) (08/29 0511) Pulse Rate:  [62-97] 62 (08/29 0511) Resp:  [16-20] 20 (08/29 0511) BP: (129-157)/(56-86) 157/79 mmHg (08/29 0511) SpO2:  [95 %-100 %] 97 % (08/29 0511)  Physical Exam: General: Lying in bed in NAD  Cardiovascular: RRR, no m/r/g. No LE edema.  Respiratory: CTAB, non-labored breathing  Abdomen: soft, NT, ND  Neuro: A&O x3. CN II-XII grossly intact. Normal strength right and left UE/LE. Normal sensation throughout. Normal speech. Psych: Mood normal. Normal thought content.   Laboratory:  Recent Labs Lab 04/16/15 1331  WBC 6.5  HGB 13.4  HCT 40.9  PLT 182    Recent Labs Lab 04/16/15 1331  NA 140  K 3.9  CL 105  CO2 27  BUN 12  CREATININE 0.82  CALCIUM 9.4  PROT 6.3*  BILITOT 0.6  ALKPHOS 107  ALT 30  AST 23  GLUCOSE 96   Cholesterol 0 - 200 mg/dL 147 (H)   Triglycerides <150 mg/dL 829 (H)   HDL >56 mg/dL 48   Total CHOL/HDL Ratio RATIO 7.6   VLDL 0 - 40 mg/dL UNABLE TO CALCULATE IF TRIGLYCERIDE OVER 400 mg/dL   LDL Cholesterol 0 - 99 mg/dL UNABLE TO CALCULATE IF TRIGLYCERIDE OVER 400 mg/dL         Imaging/Diagnostic Tests: Ct Head Wo Contrast  04/16/2015   CLINICAL DATA:  Dizziness beginning suddenly this morning, RIGHT hand swelling post RIGHT hand surgery 1 week ago, question stroke  EXAM: CT HEAD WITHOUT CONTRAST  TECHNIQUE: Contiguous axial images were obtained from the base of the  skull through the vertex without intravenous contrast.  COMPARISON:  None  FINDINGS: Normal ventricular morphology.  No midline shift or mass effect.  Normal appearance of brain parenchyma.  No intracranial hemorrhage, mass lesion, or acute infarction.  Visualized paranasal sinuses and mastoid air cells clear.  Bones unremarkable.  Minimal atherosclerotic calcification at the carotid siphons bilaterally.  IMPRESSION: No acute intracranial abnormalities.   Electronically Signed   By: Ulyses Southward  M.D.   On: 04/16/2015 13:37   Mr Brain Wo Contrast  04/17/2015   CLINICAL DATA:  Gait abnormality beginning yesterday morning, confusion, dizziness. Hand surgery 1 week ago. Assess transient ischemic attack.  EXAM: MRI HEAD WITHOUT CONTRAST  MRA HEAD WITHOUT CONTRAST  TECHNIQUE: Multiplanar, multiecho pulse sequences of the brain and surrounding structures were obtained without intravenous contrast. Angiographic images of the head were obtained using MRA technique without contrast.  COMPARISON:  CT head April 16, 2015  FINDINGS: MRI HEAD FINDINGS  The ventricles and sulci are normal for patient's age. No abnormal parenchymal signal, mass lesions, mass effect. No reduced diffusion to suggest acute ischemia. No susceptibility artifact to suggest hemorrhage.  No abnormal extra-axial fluid collections. No extra-axial masses though, contrast enhanced sequences would be more sensitive. Normal major intracranial vascular flow voids seen at the skull base.  Ocular globes and orbital contents are unremarkable though not tailored for evaluation. No abnormal sellar expansion. Visualized paranasal sinuses and mastoid air cells are well-aerated, LEFT concha bullosa. No suspicious calvarial bone marrow signal. No abnormal sellar expansion. Craniocervical junction maintained.  MRA HEAD FINDINGS  Anterior circulation: Normal flow related enhancement of the included cervical, petrous, cavernous and supra clinoid internal carotid arteries. Very mild luminal irregularity LEFT cavernous carotid artery corresponding to calcific atherosclerosis on prior head CT. Patent anterior communicating artery. Normal flow related enhancement of the anterior and middle cerebral arteries, including more distal segments.  No large vessel occlusion, high-grade stenosis, abnormal luminal irregularity, aneurysm.  Posterior circulation: LEFT vertebral artery is dominant. Basilar artery is patent, with normal flow related enhancement of the main branch  vessels. Fetal origin RIGHT posterior cerebral artery. Small LEFT posterior communicating artery present. Normal flow related enhancement of the posterior cerebral arteries.  No large vessel occlusion, high-grade stenosis, abnormal luminal irregularity, aneurysm.  IMPRESSION: MRI HEAD: No acute intracranial process; normal noncontrast MRI of the brain for age.  MRA HEAD: No acute large vessel occlusion or high-grade stenosis ; complete circle of Willis.   Electronically Signed   By: Awilda Metro M.D.   On: 04/17/2015 02:41   Mr Maxine Glenn Head/brain Wo Cm  04/17/2015   CLINICAL DATA:  Gait abnormality beginning yesterday morning, confusion, dizziness. Hand surgery 1 week ago. Assess transient ischemic attack.  EXAM: MRI HEAD WITHOUT CONTRAST  MRA HEAD WITHOUT CONTRAST  TECHNIQUE: Multiplanar, multiecho pulse sequences of the brain and surrounding structures were obtained without intravenous contrast. Angiographic images of the head were obtained using MRA technique without contrast.  COMPARISON:  CT head April 16, 2015  FINDINGS: MRI HEAD FINDINGS  The ventricles and sulci are normal for patient's age. No abnormal parenchymal signal, mass lesions, mass effect. No reduced diffusion to suggest acute ischemia. No susceptibility artifact to suggest hemorrhage.  No abnormal extra-axial fluid collections. No extra-axial masses though, contrast enhanced sequences would be more sensitive. Normal major intracranial vascular flow voids seen at the skull base.  Ocular globes and orbital contents are unremarkable though not tailored for evaluation. No abnormal sellar expansion. Visualized paranasal sinuses and  mastoid air cells are well-aerated, LEFT concha bullosa. No suspicious calvarial bone marrow signal. No abnormal sellar expansion. Craniocervical junction maintained.  MRA HEAD FINDINGS  Anterior circulation: Normal flow related enhancement of the included cervical, petrous, cavernous and supra clinoid internal carotid  arteries. Very mild luminal irregularity LEFT cavernous carotid artery corresponding to calcific atherosclerosis on prior head CT. Patent anterior communicating artery. Normal flow related enhancement of the anterior and middle cerebral arteries, including more distal segments.  No large vessel occlusion, high-grade stenosis, abnormal luminal irregularity, aneurysm.  Posterior circulation: LEFT vertebral artery is dominant. Basilar artery is patent, with normal flow related enhancement of the main branch vessels. Fetal origin RIGHT posterior cerebral artery. Small LEFT posterior communicating artery present. Normal flow related enhancement of the posterior cerebral arteries.  No large vessel occlusion, high-grade stenosis, abnormal luminal irregularity, aneurysm.  IMPRESSION: MRI HEAD: No acute intracranial process; normal noncontrast MRI of the brain for age.  MRA HEAD: No acute large vessel occlusion or high-grade stenosis ; complete circle of Willis.   Electronically Signed   By: Awilda Metro M.D.   On: 04/17/2015 02:41    Asiyah Mayra Reel, MD 04/18/2015, 7:25 AM PGY-1, [facility] Family Medicine FPTS Intern pager: [phone], text pages welcome

## 2015-04-18 NOTE — Discharge Instructions (Signed)
You were admitted [name] for a TIA. You will need to set up an appointment with your primary care physician. You were started on Lipitor 40 mg and will need to continue Aspirin 81 mg. Please return if you have weakness, confusion or return of symptoms.     Transient Ischemic Attack A transient ischemic attack (TIA) is a "warning stroke" that causes stroke-like symptoms. Unlike a stroke, a TIA does not cause permanent damage to the brain. The symptoms of a TIA can happen very fast and do not last long. It is important to know the symptoms of a TIA and what to do. This can help prevent a major stroke or death. CAUSES   A TIA is caused by a temporary blockage in an artery in the brain or neck (carotid artery). The blockage does not allow the brain to get the blood supply it needs and can cause different symptoms. The blockage can be caused by either:  A blood clot.  Fatty buildup (plaque) in a neck or brain artery. RISK FACTORS  High blood pressure (hypertension).  High cholesterol.  Diabetes mellitus.  Heart disease.  The build up of plaque in the blood vessels (peripheral artery disease or atherosclerosis).  The build up of plaque in the blood vessels providing blood and oxygen to the brain (carotid artery stenosis).  An abnormal heart rhythm (atrial fibrillation).  Obesity.  Smoking.  Taking oral contraceptives (especially in combination with smoking).  Physical inactivity.  A diet high in fats, salt (sodium), and calories.  Alcohol use.  Use of illegal drugs (especially cocaine and methamphetamine).  Being female.  Being African American.  Being over the age of 19.  Family history of stroke.  Previous history of blood clots, stroke, TIA, or heart attack.  Sickle cell disease. SYMPTOMS  TIA symptoms are the same as a stroke but are temporary. These symptoms usually develop suddenly, or may be newly present upon awakening from sleep:  Sudden weakness or  numbness of the face, arm, or leg, especially on one side of the body.  Sudden trouble walking or difficulty moving arms or legs.  Sudden confusion.  Sudden personality changes.  Trouble speaking (aphasia) or understanding.  Difficulty swallowing.  Sudden trouble seeing in one or both eyes.  Double vision.  Dizziness.  Loss of balance or coordination.  Sudden severe headache with no known cause.  Trouble reading or writing.  Loss of bowel or bladder control.  Loss of consciousness. DIAGNOSIS  Your caregiver may be able to determine the presence or absence of a TIA based on your symptoms, history, and physical exam. Computed tomography (CT scan) of the brain is usually performed to help identify a TIA. Other tests may be done to diagnose a TIA. These tests may include:  Electrocardiography.  Continuous heart monitoring.  Echocardiography.  Carotid ultrasonography.  Magnetic resonance imaging (MRI).  A scan of the brain circulation.  Blood tests. PREVENTION  The risk of a TIA can be decreased by appropriately treating high blood pressure, high cholesterol, diabetes, heart disease, and obesity and by quitting smoking, limiting alcohol, and staying physically active. TREATMENT  Time is of the essence. Since the symptoms of TIA are the same as a stroke, it is important to seek treatment as soon as possible because you may need a medicine to dissolve the clot (thrombolytic) that cannot be given if too much time has passed. Treatment options vary. Treatment options may include rest, oxygen, intravenous (IV) fluids, and medicines to thin  the blood (anticoagulants). Medicines and diet may be used to address diabetes, high blood pressure, and other risk factors. Measures will be taken to prevent short-term and long-term complications, including infection from breathing foreign material into the lungs (aspiration pneumonia), blood clots in the legs, and falls. Treatment options  include procedures to either remove plaque in the carotid arteries or dilate carotid arteries that have narrowed due to plaque. Those procedures are:  Carotid endarterectomy.  Carotid angioplasty and stenting. HOME CARE INSTRUCTIONS   Take all medicines prescribed by your caregiver. Follow the directions carefully. Medicines may be used to control risk factors for a stroke. Be sure you understand all your medicine instructions.  You may be told to take aspirin or the anticoagulant warfarin. Warfarin needs to be taken exactly as instructed.  Taking too much or too little warfarin is dangerous. Too much warfarin increases the risk of bleeding. Too little warfarin continues to allow the risk for blood clots. While taking warfarin, you will need to have regular blood tests to measure your blood clotting time. A PT blood test measures how long it takes for blood to clot. Your PT is used to calculate another value called an INR. Your PT and INR help your caregiver to adjust your dose of warfarin. The dose can change for many reasons. It is critically important that you take warfarin exactly as prescribed.  Many foods, especially foods high in vitamin K can interfere with warfarin and affect the PT and INR. Foods high in vitamin K include spinach, kale, broccoli, cabbage, collard and turnip greens, brussels sprouts, peas, cauliflower, seaweed, and parsley as well as beef and pork liver, green tea, and soybean oil. You should eat a consistent amount of foods high in vitamin K. Avoid major changes in your diet, or notify your caregiver before changing your diet. Arrange a visit with a dietitian to answer your questions.  Many medicines can interfere with warfarin and affect the PT and INR. You must tell your caregiver about any and all medicines you take, this includes all vitamins and supplements. Be especially cautious with aspirin and anti-inflammatory medicines. Do not take or discontinue any prescribed or  over-the-counter medicine except on the advice of your caregiver or pharmacist.  Warfarin can have side effects, such as excessive bruising or bleeding. You will need to hold pressure over cuts for longer than usual. Your caregiver or pharmacist will discuss other potential side effects.  Avoid sports or activities that may cause injury or bleeding.  Be mindful when shaving, flossing your teeth, or handling sharp objects.  Alcohol can change the body's ability to handle warfarin. It is best to avoid alcoholic drinks or consume only very small amounts while taking warfarin. Notify your caregiver if you change your alcohol intake.  Notify your dentist or other caregivers before procedures.  Eat a diet that includes 5 or more servings of fruits and vegetables each day. This may reduce the risk of stroke. Certain diets may be prescribed to address high blood pressure, high cholesterol, diabetes, or obesity.  A low-sodium, low-saturated fat, low-trans fat, low-cholesterol diet is recommended to manage high blood pressure.  A low-saturated fat, low-trans fat, low-cholesterol, and high-fiber diet may control cholesterol levels.  A controlled-carbohydrate, controlled-sugar diet is recommended to manage diabetes.  A reduced-calorie, low-sodium, low-saturated fat, low-trans fat, low-cholesterol diet is recommended to manage obesity.  Maintain a healthy weight.  Stay physically active. It is recommended that you get at least 30 minutes of activity on  most or all days.  Do not smoke.  Limit alcohol use even if you are not taking warfarin. Moderate alcohol use is considered to be:  No more than 2 drinks each day for men.  No more than 1 drink each day for nonpregnant women.  Stop drug abuse.  Home safety. A safe home environment is important to reduce the risk of falls. Your caregiver may arrange for specialists to evaluate your home. Having grab bars in the bedroom and bathroom is often  important. Your caregiver may arrange for equipment to be used at home, such as raised toilets and a seat for the shower.  Follow all instructions for follow-up with your caregiver. This is very important. This includes any referrals and lab tests. Proper follow up can prevent a stroke or another TIA from occurring. SEEK MEDICAL CARE IF:  You have personality changes.  You have difficulty swallowing.  You are seeing double.  You have dizziness.  You have a fever.  You have skin breakdown. SEEK IMMEDIATE MEDICAL CARE IF:  Any of these symptoms may represent a serious problem that is an emergency. Do not wait to see if the symptoms will go away. Get medical help right away. Call your local emergency services (911 in [place] Do not drive yourself to the hospital.  You have sudden weakness or numbness of the face, arm, or leg, especially on one side of the body.  You have sudden trouble walking or difficulty moving arms or legs.  You have sudden confusion.  You have trouble speaking (aphasia) or understanding.  You have sudden trouble seeing in one or both eyes.  You have a loss of balance or coordination.  You have a sudden, severe headache with no known cause.  You have new chest pain or an irregular heartbeat.  You have a partial or total loss of consciousness. MAKE SURE YOU:   Understand these instructions.  Will watch your condition.  Will get help right away if you are not doing well or get worse. Document Released: 05/16/2005 Document Revised: 08/11/2013 Document Reviewed: 11/11/2013 [facility] Patient Information 2015 [place], [place]. This information is not intended to replace advice given to you by your health care provider. Make sure you discuss any questions you have with your health care provider.

## 2015-04-18 NOTE — Discharge Summary (Signed)
Family Medicine Teaching [facility] Discharge Summary  Patient name: [name] Medical record number: [id number] Date of birth: 12/11/1950 Age: 64 y.o. Gender: female Date of Admission: 04/16/2015  Date of Discharge: 04/18/2015  Admitting Physician: Carney Living, MD  Primary Care Provider: No primary care provider on file. Consultants: Neurology   Indication for Hospitalization:  TIA    Discharge Diagnoses/Problem List:  Psoriasis  Dupuytren's Contractions   Disposition: Home   Discharge Condition: Good   Discharge Exam: General: Lying in bed in NAD  Cardiovascular: RRR, no m/r/g. No LE edema.  Respiratory: CTAB, non-labored breathing  Abdomen: soft, NT, ND  Neuro: A&O x3. CN II-XII grossly intact. Normal strength right and left UE/LE. Normal sensation throughout. Normal speech. Psych: Mood normal. Normal thought content.   Brief Hospital Course:  [name] is a 64 yo. female presenting with dizziness and slurred speech likely due to TIA. Patient's CT, MRI/MRA was negative. Vascular US showed right carotid stenosis of 40-59% and 1-39% left ICA stenosis.Vertebral artery flow is antegrade. Lipid Panel: Cholesterol 365, Triglycerides 473, HDL 48, unable to calculate LDL. Patient's neurologic status was completely normal limits by time of discharge. Patient was started on a high intensity statin and restarted aspirin with instructions to follow up with primary care physician.   Issues for Follow Up:  1. Please recheck blood pressure 2. Consider PT if patient is amenable to physical therapy  3. Echo and HgbA1C pending 4. Will need a fasting lipid panel  5. Started patient on Lipitor please follow up  6. Follow up on neurologic status    Significant Procedures: None   Significant Labs and Imaging:   Recent Labs Lab 04/16/15 1331  WBC 6.5  HGB 13.4  HCT 40.9  PLT 182    Recent Labs Lab 04/16/15 1331  NA 140  K 3.9  CL 105  CO2 27   GLUCOSE 96  BUN 12  CREATININE 0.82  CALCIUM 9.4  ALKPHOS 107  AST 23  ALT 30  ALBUMIN 3.7     Results/Tests Pending at Time of Discharge: None   Discharge Medications:    Medication List    TAKE these medications        aspirin 325 MG EC tablet  Take 1 tablet (325 mg total) by mouth daily.  Start taking on:  04/19/2015     atorvastatin 80 MG tablet  Commonly known as:  LIPITOR  Take 1 tablet (80 mg total) by mouth daily.  Start taking on:  04/19/2015        Discharge Instructions: Please refer to Patient Instructions section of EMR for full details.  Patient was counseled important signs and symptoms that should prompt return to medical care, changes in medications, dietary instructions, activity restrictions, and follow up appointments.   Follow-Up Appointments: Follow-up Information    Follow up with Anson Fret, MD. Schedule an appointment as soon as possible for a visit in 1 month.   Specialty:  Neurology   Contact information:   [address] [place] [address] [phone]       Berton Bon, MD 04/18/2015, 10:15 AM PGY-1, [facility] Family [facility]

## 2015-04-18 NOTE — Progress Notes (Signed)
  Echocardiogram 2D Echocardiogram has been performed.  [name] 04/18/2015, 11:45 AM

## 2015-04-18 NOTE — Progress Notes (Signed)
Physical Therapy Treatment Patient Details Name: [name] MRN: [id number] DOB: January 30, 1951 Today's Date: 04/18/2015    History of Present Illness Pt 64 y.o. female presenting with transient staggering gait, dizziness, dysarthria, and mild confusion with progressive memory issues over the past 6-12 months. PMH is significant for recent right hand surgery for Dupuytren's contracture. CT/MRA/MRI revealed No acute intracranial abnormalities.    PT Comments    Patient reports decreased dizziness today.  Reviewed exercise program for home.  Continue to recommend OP PT for Vestibular Rehab at d/c.  Follow Up Recommendations  Supervision for mobility/OOB;Outpatient PT (for Vestibular Rehab)     Equipment Recommendations  None recommended by PT    Recommendations for Other Services       Precautions / Restrictions Precautions Precautions: None Precaution Comments: dizziness Restrictions Other Position/Activity Restrictions: s/p right hand surgery for Dupuytren's contracture. Pt reports she has a splint she wears at night and is not supposed to get incision wet. Pt reports she does not use right hand to WB.     Mobility  Bed Mobility Overal bed mobility: Independent                Transfers Overall transfer level: Modified independent Equipment used: None Transfers: Sit to/from Stand Sit to Stand: Modified independent (Device/Increase time)         General transfer comment: walking around room  Ambulation/Gait             General Gait Details: Patient ambulating in room independently.   Stairs            Wheelchair Mobility    Modified Rankin (Stroke Patients Only) Modified Rankin (Stroke Patients Only) Pre-Morbid Rankin Score: No symptoms Modified Rankin: Slight disability     Balance Overall balance assessment: Modified Independent         Standing balance support: No upper extremity supported Standing balance-Leahy Scale: Good                       Cognition Arousal/Alertness: Awake/alert Behavior During Therapy: WFL for tasks assessed/performed Overall Cognitive Status: Impaired/Different from baseline Area of Impairment: Memory     Memory: Decreased short-term memory         General Comments: Patient with difficulty recalling information/exercises from PT session yesterday.  Family reports memory somewhat decreased since admission.    Exercises Other Exercises Other Exercises: Reviewed x1 exercises in sitting, both horizontally and added vertically.  Provided patient with handouts.  Patient, husband and daughter verbalized understanding.    General Comments        Pertinent Vitals/Pain Pain Assessment: No/denies pain    Home Living     Available Help at Discharge: Family;Available PRN/intermittently Type of Home: House              Prior Function            PT Goals (current goals can now be found in the care plan section) Acute Rehab PT Goals Patient Stated Goal: to go home Progress towards PT goals: Progressing toward goals    Frequency  Min 4X/week    PT Plan Current plan remains appropriate    Co-evaluation             End of Session   Activity Tolerance: Patient tolerated treatment well Patient left: in chair;with call bell/phone within reach;with family/visitor present     Time: 1135-1145 PT Time Calculation (min) (ACUTE ONLY): 10 min  Charges:  $Therapeutic Exercise: 8-22  mins                    G Codes:  Functional Assessment Tool Used: Clinical judgement Functional Limitation: Mobility: Walking and moving around Mobility: Walking and Moving Around Goal Status [phone]): At least 1 percent but less than 20 percent impaired, limited or restricted Mobility: Walking and Moving Around Discharge Status [phone]): At least 1 percent but less than 20 percent impaired, limited or restricted   Vena Austria 04/18/2015, 12:50 PM Durenda Hurt. Renaldo Fiddler, [facility] Acute  Rehab Services Pager [phone]

## 2015-04-18 NOTE — Progress Notes (Signed)
STROKE TEAM PROGRESS NOTE   SUBJECTIVE (INTERVAL HISTORY) Daughter at the bedside. Recounted history with DR. Darica Sims. Patient has limited memory of event. Memory has been poor for the last one year and continued throughout hospitalization per daughter. Patient is unaware, and states she does not remember things. No shaking or jerking, no LOC.    OBJECTIVE Temp:  [98 F (36.7 C)-98.6 F (37 C)] 98.1 F (36.7 C) (08/29 1029) Pulse Rate:  [62-69] 62 (08/29 1029) Cardiac Rhythm:  [-] Other (Comment) (08/29 0700) Resp:  [18-20] 20 (08/29 1029) BP: (129-157)/(56-84) 152/73 mmHg (08/29 1029) SpO2:  [95 %-100 %] 97 % (08/29 1029)     Component Value Date/Time   CHOL 365* 04/17/2015 0741   TRIG 473* 04/17/2015 0741   HDL 48 04/17/2015 0741   CHOLHDL 7.6 04/17/2015 0741   VLDL UNABLE TO CALCULATE IF TRIGLYCERIDE OVER 400 mg/dL 96/11/5407 8119   LDLCALC UNABLE TO CALCULATE IF TRIGLYCERIDE OVER 400 mg/dL 14/78/2956 2130   Lab Results  Component Value Date   HGBA1C 6.0* 04/17/2015   No results found for: LABOPIA, COCAINSCRNUR, LABBENZ, AMPHETMU, THCU, LABBARB   Basic Metabolic Panel:   Recent Labs Lab 04/16/15 1331  NA 140  K 3.9  CL 105  CO2 27  GLUCOSE 96  BUN 12  CREATININE 0.82  CALCIUM 9.4   CBC:   Recent Labs Lab 04/16/15 1331  WBC 6.5  NEUTROABS 3.9  HGB 13.4  HCT 40.9  MCV 89.3  PLT 182    IMAGING  Ct Head Wo Contrast 04/16/2015    No acute intracranial abnormalities.    MRI HEAD:  04/17/2015    No acute intracranial process; normal noncontrast MRI of the brain for age.    MRA HEAD:  04/17/2015    No acute large vessel occlusion or high-grade stenosis ; complete circle of Willis.     CAROTID DOPPLER  There is 1-39% L stenosis. R 40-59% stenosis. Vertebral artery flow is antegrade.    2D ECHOCARDIOGRAM   Left ventricle: The cavity size was normal. There was moderateconcentric hypertrophy. Systolic function was normal. Theestimated ejection fraction was  in the range of 55% to 60%. Wallmotion was normal; there were no regional wall motionabnormalities. Impressions:  No cardiac source of emboli was indentified.Left ventricle: The cavity size was normal. There was moderateconcentric hypertrophy. Systolic function was normal. Theestimated ejection fraction was in the range of 55% to 60%. Wallmotion was normal; there were no regional wall motionabnormalities. Impressions:  No cardiac source of emboli was indentified.  PHYSICAL EXAM General - pleasant 64 year old female alert and in no acute distress. Talkative. laughing Heart - Regular rate and rhythm - no murmer noted Lungs - Clear to auscultation Extremities - Distal pulses intact - no edema. The patient is wearing a brace on her right upper extremity. Skin - Warm and dry  Mental Status: Alert, oriented, thought content appropriate.  Speech fluent without evidence of aphasia.  Fund of knowledge decreased. Able to follow 2 step commands. Mild memory difficulties noted. Able to recall 0 of 3 objects after 5 minutes, 2 of 3 with cueing. Able to name approximately 11 words in 1 min with letter F. Unable to performed simple math calculations. Backward spelling WORLD is correct. Cranial Nerves: II: Visual fields grossly normal, pupils equal, round, reactive to light and accommodation III,IV, VI: ptosis not present, extra-ocular motions intact bilaterally V,VII: smile symmetric, facial light touch sensation normal bilaterally VIII: hearing normal bilaterally IX,X: gag reflex present XI: bilateral shoulder  shrug XII: midline tongue extension Motor: Right : Upper extremity   5/5    Left:     Upper extremity   5/5  Lower extremity   5/5     Lower extremity   5/5 Tone and bulk:normal tone throughout; no atrophy noted Sensory: light touch intact throughout, bilaterally Cerebellar: normal finger-to-nose, normal rapid alternating movements and normal heel-to-shin test Gait:  Deferred    ASSESSMENT/PLAN [name] is a 64 y.o. female with history of tobacco use, psoriasis, and recent right hand surgery presenting with transient staggering gait, dizziness, dysarthria, and mild confusion with progressive memory issues over the past 6-12 months.  She did not receive IV t-PA due to late presentation and unclear time of onset.  Encephalopathy, less likely TIA.   Resultant - progressive memory deficits for the last 6-12 months  MRI  no acute process  MRA  no large vessel occlusion or high-grade stenosis  Carotid Doppler - R 40-59% stenosis.   2D Echo  No source of embolus   LDL total cholesterol 365; triglycerides 473; LDL could not be calculated.  HgbA1c 6.0 WNL  Vit B12 - 349, Recommend replacement, goal >500  TSH - 2.531 - WNL  Lovenox for VTE prophylaxis Diet regular Room service appropriate?: Yes; Fluid consistency:: Thin  no antithrombotic prior to admission, now on aspirin 81 mg orally every day.   Patient was off her aspirin due to hand surgery when event occurred, so no need to change management at this time.  Patient counseled to be compliant with her antithrombotic medications  Ongoing aggressive stroke risk factor management  Consider outpt EEG to rule out seizure  Therapy recommendations:  OP PT (vestibular). OP ST, no OT  Disposition:  Home w/ OP therapies  Right ICA stenosis  40-59% by CUS  Follow up Carotid doppler in 6 mos to assess R ICA stenosis  Hyperlipidemia  Home meds: No lipid lowering medications prior to admission  LDL unable to calculate  New Lipitor 80 mg daily.   Recommend adding zetia 10 mg daily  Continue statin and zetia at discharge  Tobacco abuse  Current smoker  Smoking cessation counseling provided  Pt is willing to quit  Other Stroke Risk Factors  Advanced age  Other Active Problems  Progressive memory problems. Needs OP eval with Dr. Lucia Sims in 1-2 months.  Hospital D# 2    Marvel Plan, MD PhD Stroke Neurology 04/19/2015 5:58 AM   To contact Stroke Continuity provider, please refer to [email or web address]. After hours, contact General Neurology

## 2015-04-18 NOTE — Progress Notes (Signed)
Occupational Therapy Treatment Patient Details Name: [name] MRN: [id number] DOB: Sep 07, 1950 Today's Date: 04/18/2015    History of present illness Pt 64 y.o. female presenting with transient staggering gait, dizziness, dysarthria, and mild confusion with progressive memory issues over the past 6-12 months. PMH is significant for recent right hand surgery for Dupuytren's contracture. CT/MRA/MRI revealed No acute intracranial abnormalities.   OT comments  Pt appears close to baseline. Husband reports he feels wife is close to baseline. Pt with apparent memory deficits. Recommend pt follow up with primary physician regarding memory deficits.   Follow Up Recommendations  Supervision - Intermittent;No OT follow up    Equipment Recommendations  None recommended by OT    Recommendations for Other Services      Precautions / Restrictions Precautions Precautions: Other (comment) (recent hand surgery)       Mobility Bed Mobility Overal bed mobility: Independent                Transfers Overall transfer level: Modified independent               General transfer comment: walking around room    Balance Overall balance assessment: Modified Independent                                 ADL                                       Functional mobility during ADLs: Independent General ADL Comments: Pt close to baseline regarding ADL. Recent R hand surgery . Following wtih Dr. Veronia Beets. Apparent memory deficits.       Vision                     Perception     Praxis      Cognition   Behavior During Therapy: WFL for tasks assessed/performed Overall Cognitive Status: Within Functional Limits for tasks assessed       Memory: Decreased short-term memory (PTA)               Extremity/Trunk Assessment               Exercises     Shoulder Instructions       General Comments      Pertinent Vitals/ Pain        Pain Assessment: No/denies pain  Home Living                                          Prior Functioning/Environment              Frequency       Progress Toward Goals  OT Goals(current goals can now be found in the care plan section)  Progress towards OT goals: Goals met/education completed, patient discharged from OT  Acute Rehab OT Goals Patient Stated Goal: to go home OT Goal Formulation: With patient/family Time For Goal Achievement: 04/24/15 Potential to Achieve Goals: Good ADL Goals Pt Will Perform Grooming: standing;sitting;with modified independence Pt Will Perform Lower Body Bathing: with modified independence;sit to/from stand Pt Will Perform Lower Body Dressing: with modified independence;sit to/from stand Pt Will Transfer to Toilet: Independently;regular height toilet;grab bars Pt Will Perform Toileting - Clothing Manipulation  and hygiene: Independently;sit to/from stand Pt Will Perform Tub/Shower Transfer: Shower transfer;Independently;shower seat  Plan Discharge plan remains appropriate    Co-evaluation                 End of Session Equipment Utilized During Treatment: Gait belt   Activity Tolerance Patient tolerated treatment well   Patient Left in bed;with call bell/phone within reach;with family/visitor present   Nurse Communication          Time: 2706-2376 OT Time Calculation (min): 14 min  Charges: OT General Charges $OT Visit: 1 Procedure OT Treatments $Self Care/Home Management : 8-22 mins  Brant Peets,HILLARY 04/18/2015, 9:50 AM   Maurie Boettcher, OTR/L  [phone] 04/18/2015

## 2015-04-18 NOTE — Progress Notes (Signed)
Pt discharged home with daughter as transport at 47. [name]

## 2015-04-19 DIAGNOSIS — G9340 Encephalopathy, unspecified: Secondary | ICD-10-CM | POA: Insufficient documentation

## 2015-04-19 DIAGNOSIS — R413 Other amnesia: Secondary | ICD-10-CM | POA: Insufficient documentation

## 2015-05-02 ENCOUNTER — Ambulatory Visit (INDEPENDENT_AMBULATORY_CARE_PROVIDER_SITE_OTHER): Payer: Medicare Other | Admitting: Neurology

## 2015-05-02 ENCOUNTER — Encounter: Payer: Self-pay | Admitting: Neurology

## 2015-05-02 VITALS — BP 136/79 | HR 61 | Ht 65.0 in | Wt 187.5 lb

## 2015-05-02 DIAGNOSIS — R413 Other amnesia: Secondary | ICD-10-CM

## 2015-05-02 DIAGNOSIS — F05 Delirium due to known physiological condition: Secondary | ICD-10-CM

## 2015-05-02 DIAGNOSIS — R410 Disorientation, unspecified: Secondary | ICD-10-CM | POA: Insufficient documentation

## 2015-05-02 DIAGNOSIS — R42 Dizziness and giddiness: Secondary | ICD-10-CM

## 2015-05-02 DIAGNOSIS — G459 Transient cerebral ischemic attack, unspecified: Secondary | ICD-10-CM | POA: Diagnosis not present

## 2015-05-02 MED ORDER — ASPIRIN EC 81 MG PO TBEC: 81.0000 mg | DELAYED_RELEASE_TABLET | Freq: Every day | ORAL | Status: DC

## 2015-05-02 NOTE — Progress Notes (Signed)
[facility]    Provider:  Dr Lucia Sims Referring Provider: Layne Benton, NP Primary Care Physician:  No primary care provider on file.  CC:  TIA  HPI:  [name] is a 64 y.o. female here as a referral from [name] for a TIA. She is here with daughter. Daughter said she staggered, head was hanging down, said she was really dizzy, slurring of speech, reported room was spinning. Patient was in her closet trying to figure out what to wear. Patient doesn't remember any of it. No previous incidents of this type. Daughter said she will go to the grocery store and not get anything. She will forget how to set the alarm. Since this incident, patient is more confused. She has not been to a doctor in years previous to hospitalization. Patient denies any new medications. Two weeks before this happened she had surgery on her hand for Dupuytren's contracture. She gets dizzy and confused in the grocery store. Patient doesn't remember a lot of these things. More short-term memory loss. Things that happened a long time she remembers. They believe patient's mother may have Alzheimers. No depression or mood disorder. No new medications. Memory problems started 6-12 months ago and getting worse. Aspirin had been held for surgery. Started statin in the hospital. Denies headache, any current neurologic symptoms, CP, SOB, visual changes. She is having episodic vertigo and memory changes as above.   Reviewed notes, labs and imaging from outside physicians, which showed:  Per neurology notes inpatient: [name] is a 64 y.o. female who believed to be healthy but has not had any regular medical follow-up for several years. She had surgery on her right hand approximately one week ago performed by Yvette. Amanda Sims for a Dupuytren's contracture. The patient's daughter drove over to her mother's house to pick up something. Her mother walked out of the house towards the car and her daughter noted that her  mother appeared to be staggering. She helped her mother back in to the house, sat her down, and called 911. The daughter noticed some mild dysarthria and said that this was also noted by the EMTs.She also felt that her mother was mildly confused. The patient felt as if the room was spinning. The patient herself did not remember much about the incident. She remembers working in the house and getting ready to go out to the car; however, she is foggy about the details. The patient's speech was back to normal in the emergency department, although she still seemed to have some mild cognitive difficulties. When her daughter tried to help her mother get up and go to the bathroom in the ER the patient was once again dizzy. She does not feel dizzy lying down. The patient's daughter estimates that the worst of these symptoms lasted approximately 2 hours. The patient denied any recent headaches, palpitations, nausea or vomiting, visual changes, speech difficulties, changes in hearing, abnormal sounds or ringing in her ears.  The patient's husband and daughter both report that the patient's memory has been getting progressively worse over the past 6 months to one year. She had been taking aspirin 81 mg daily but this was recently held for her hand surgery.  MRI HEAD FINDINGS personally reviewed images and agree with the following.   The ventricles and sulci are normal for patient's age. No abnormal parenchymal signal, mass lesions, mass effect. No reduced diffusion to suggest acute ischemia. No susceptibility artifact to suggest hemorrhage.  No abnormal extra-axial fluid collections.  No extra-axial masses though, contrast enhanced sequences would be more sensitive. Normal major intracranial vascular flow voids seen at the skull base.  Ocular globes and orbital contents are unremarkable though not tailored for evaluation. No abnormal sellar expansion. Visualized paranasal sinuses and mastoid air cells are  well-aerated, LEFT concha bullosa. No suspicious calvarial bone marrow signal. No abnormal sellar expansion. Craniocervical junction maintained.  MRA HEAD FINDINGS  Anterior circulation: Normal flow related enhancement of the included cervical, petrous, cavernous and supra clinoid internal carotid arteries. Very mild luminal irregularity LEFT cavernous carotid artery corresponding to calcific atherosclerosis on prior head CT. Patent anterior communicating artery. Normal flow related enhancement of the anterior and middle cerebral arteries, including more distal segments.  No large vessel occlusion, high-grade stenosis, abnormal luminal irregularity, aneurysm.  Posterior circulation: LEFT vertebral artery is dominant. Basilar artery is patent, with normal flow related enhancement of the main branch vessels. Fetal origin RIGHT posterior cerebral artery. Small LEFT posterior communicating artery present. Normal flow related enhancement of the posterior cerebral arteries.  No large vessel occlusion, high-grade stenosis, abnormal luminal irregularity, aneurysm.  IMPRESSION: MRI HEAD: No acute intracranial process; normal noncontrast MRI of the brain for age.  MRA HEAD: No acute large vessel occlusion or high-grade stenosis ; complete circle of Willis.  hgba1c 6 LDL unable to calculate given such high triglycerides B12 349 TSH 2.5 CBC,CMP unremarkable  Study Conclusions  - Left ventricle: The cavity size was normal. There was moderate concentric hypertrophy. Systolic function was normal. The estimated ejection fraction was in the range of 55% to 60%. Wall motion was normal; there were no regional wall motion abnormalities.  Impressions:  - No cardiac source of emboli was indentified.  Review of Systems: Patient complains of symptoms per HPI as well as the following symptoms: weight loss, CP, snoring, rash, confusion, dizziness. Pertinent negatives per HPI. All others  negative.   Social History   Social History  . Marital Status: Married    Spouse Name: N/A  . Number of Children: 2  . Years of Education: 12   Occupational History  . Not on file.   Social History Main Topics  . Smoking status: Former Smoker    Quit date: 08/20/2004  . Smokeless tobacco: Not on file  . Alcohol Use: 0.0 oz/week    0 Standard drinks or equivalent per week  . Drug Use: No  . Sexual Activity: Yes   Other Topics Concern  . Not on file   Social History Narrative   Lives at home with husband.    Caffeine use: minimal    Family History  Problem Relation Age of Onset  . Cancer Father     Past Medical History  Diagnosis Date  . Psoriasis   . High cholesterol   . Eczema     Past Surgical History  Procedure Laterality Date  . Tonsillectomy    . Hand surgery      Carpal tunnel  . Shoulder surgery    . Knee surgery      Current Outpatient Prescriptions  Medication Sig Dispense Refill  . Apoaequorin (PREVAGEN PO) Take by mouth.    Marland Kitchen aspirin EC 325 MG EC tablet Take 1 tablet (325 mg total) by mouth daily. 90 tablet 0  . atorvastatin (LIPITOR) 80 MG tablet Take 1 tablet (80 mg total) by mouth daily. 90 tablet 0  . Bisacodyl (DULCOLAX PO) Take by mouth as needed.    . clobetasol cream (TEMOVATE) 0.05 % Apply 1 application  topically as needed.  3  . Ginkgo Biloba (GINKOBA PO) Take by mouth.    . Niacin POWD 1 application by Does not apply route as needed.     No current facility-administered medications for this visit.    Allergies as of 05/02/2015 - Review Complete 05/02/2015  Allergen Reaction Noted  . Ibuprofen  03/28/2013    Vitals: BP 136/79 mmHg  Pulse 61  Ht 5\' 5"  (1.651 m)  Wt 187 lb 8 oz (85.049 kg)  BMI 31.20 kg/m2 Last Weight:  Wt Readings from Last 1 Encounters:  05/02/15 187 lb 8 oz (85.049 kg)   Last Height:   Ht Readings from Last 1 Encounters:  05/02/15 5\' 5"  (1.651 m)   Physical exam: Exam: Gen: NAD, conversant,  well nourised, well groomed                     CV: RRR, no MRG. No Carotid Bruits. No peripheral edema, warm, nontender Eyes: Conjunctivae clear without exudates or hemorrhage  Neuro: Detailed Neurologic Exam  Speech:    Speech is normal; fluent and spontaneous with normal comprehension.  Cognition: MMSE - Mini Mental State Exam 05/02/2015  Orientation to time 3  Orientation to Place 4  Registration 3  Attention/ Calculation 5  Recall 0  Language- name 2 objects 2  Language- repeat 1  Language- follow 3 step command 3  Language- read & follow direction 1  Write a sentence 1  Copy design 1  Total score 24        The patient is oriented to person, year, note date or month    recent and remote memory intact;     language fluent;     normal attention, concentration,     fund of knowledge Cranial Nerves:    The pupils are equal, round, and reactive to light. The fundi are flat. Visual fields are full to finger confrontation. Extraocular movements are intact. Trigeminal sensation is intact and the muscles of mastication are normal. The face is symmetric. The palate elevates in the midline. Hearing intact. Voice is normal. Shoulder shrug is normal. The tongue has normal motion without fasciculations.   Coordination:    Normal finger to nose and heel to shin. Normal rapid alternating movements.   Gait:    Heel-toe and tandem gait are normal.   Motor Observation:    No asymmetry, no atrophy, and no involuntary movements noted. Tone:    Normal muscle tone.    Posture:    Posture is normal. normal erect    Strength:    Strength is V/V in the upper and lower limbs.      Sensation: intact to LT     Reflex Exam:  DTR's:    Deep tendon reflexes in the upper and lower extremities are normal bilaterally.   Toes:    The toes are downgoing bilaterally.   Clonus:    Clonus is absent.    ASSESSMENT/PLAN [name] is a 64 y.o. female with history of his tobacco  history, psoriasis, and recent right hand surgery presented with transient staggering gait, dizziness, dysarthria, and mild confusion with progressive memory issues over the past 6-12 months. She did not receive IV t-PA due to late presentation and unclear time of onset.  TIA: Dominant - unknown etiology  Resultant - very mild cognitive deficits.  MRI no acute process  MRA no large vessel occlusion or high-grade stenosis  Carotid Doppler - There is 40-59% right  stenosis. There is 1-39% left ICA stenosis. Vertebral artery flow is antegrade.  2D Echo unremarkabke  LDL total cholesterol 365; triglycerides 473; LDL could not be calculated.  HgbA1c 6.0  Vit B12 - 349 - WNL  TSH - 2.531 - WNL  Hyperlipidemia  Home meds: No lipid lowering medications prior to admission  LDL as above, goal < 70  Recommend Lipitor 80 mg daily. Consider fenofibrate. Follo wup with pcp  Continue statin at discharge  Diabetes  HgbA1c 6 goal < 7.0  No previous history of diabetes mellitus  Other Stroke Risk Factors  Advanced age  Cigarette smoker, quit smoking 2005 or 2006. (30-40 pack year history)  Obesity, Body mass index is 29.95 kg/(m^2).   PLAN:  Progressive memory problems; Will order EEG for confusion, check HIV, rpr, MMA, B1  Needs primary care follow up for other metabolic/toxic causes of her subacute confusion and triglyceride management. Needs close management of vascular risk factors.   Continue aspirin 81 mg daily, lipitor  If EEG negative will consider ambulatory EEG  Consider neurocognitive testing with [name]  Will perform MMSE today and evaluate for clinical trials. Will perform a MoCA at next appointment if necessary.   Naomie Dean, MD  [facility] [address] [place], [place] [address]  Phone [phone] Fax [phone]

## 2015-05-02 NOTE — Patient Instructions (Signed)
Overall you are doing fairly well but I do want to suggest a few things today:   Remember to drink plenty of fluid, eat healthy meals and do not skip any meals. Try to eat protein with a every meal and eat a healthy snack such as fruit or nuts in between meals. Try to keep a regular sleep-wake schedule and try to exercise daily, particularly in the form of walking, 20-30 minutes a day, if you can.   As far as your medications are concerned, I would like to suggest: baby Aspirin and Lipitor  As far as diagnostic testing: EEG, labs  I would like to see you back in 8 weeks, sooner if we need to. Please call us with any interim questions, concerns, problems, updates or refill requests.   Our phone number is [phone]. We also have an after hours call service for urgent matters and there is a physician on-call for urgent questions. For any emergencies you know to call 911 or go to the nearest emergency room

## 2015-05-04 ENCOUNTER — Ambulatory Visit (INDEPENDENT_AMBULATORY_CARE_PROVIDER_SITE_OTHER): Payer: Medicare Other | Admitting: Neurology

## 2015-05-04 DIAGNOSIS — F05 Delirium due to known physiological condition: Principal | ICD-10-CM

## 2015-05-04 DIAGNOSIS — R410 Disorientation, unspecified: Secondary | ICD-10-CM | POA: Diagnosis not present

## 2015-05-04 DIAGNOSIS — R413 Other amnesia: Secondary | ICD-10-CM

## 2015-05-04 LAB — HIV ANTIBODY (ROUTINE TESTING W REFLEX): HIV SCREEN 4TH GENERATION: NONREACTIVE

## 2015-05-04 LAB — METHYLMALONIC ACID, SERUM: METHYLMALONIC ACID: 121 nmol/L (ref 0–378)

## 2015-05-04 LAB — RPR: RPR Ser Ql: NONREACTIVE

## 2015-05-04 LAB — VITAMIN B1

## 2015-05-04 LAB — AMMONIA: AMMONIA: 74 ug/dL (ref 19–87)

## 2015-05-04 NOTE — Procedures (Signed)
     History: [name] is a 64 year old patient with a history of a recent admission for a confusional episode that occurred at home. The patient has no recollection of the events. She is being evaluated for this episode.   This is a routine EEG. No skull defects are noted. Medications include aspirin, Lipitor, and all collapse.  EEG classification: Dysrhythmia grade 1 generalized  Description of the recording: The background rhythms of this recording consists of a relatively well modulated medium amplitude theta frequency rhythms of 6-7 Hz. As the record progresses, the background rhythm activities remain stable. Photic stimulation was not performed, hyperventilation is performed and results in a very minimal buildup of the background rhythm activities without significant slowing seen. At no time during the recording does there appear to be evidence of spike or spike wave discharges or evidence of focal slowing. EKG monitor shows no evidence of cardiac rhythm activities with a heart rate of 66.  Impression: This is an abnormal EEG recording secondary to diffuse background theta frequency slowing. This is a nonspecific recording, and can be seen with any process that results in a toxic or metabolic encephalopathy, or any dementing illness. No epileptiform discharges were seen.

## 2015-05-09 ENCOUNTER — Telehealth: Payer: Self-pay | Admitting: Neurology

## 2015-05-09 NOTE — Telephone Encounter (Signed)
Daughter Percell Locus called requesting results of EEG, daughter asks that we not call the patient because she is very confused right now.

## 2015-05-09 NOTE — Telephone Encounter (Signed)
Spoke with patient's daughter. EEG showed generalized slowing without epileptiform activity which can be seen in dementia or with toxic/metabolic causes. HIV, RPR. B12, Ammonia,tsh were unremarkable. Advised her pcp should evaluate for any other causes of confusion. Can consider neuropsych testing or ambulateory eeg. She can call me back thanks thanks

## 2015-05-23 ENCOUNTER — Ambulatory Visit: Payer: Self-pay | Admitting: Neurology

## 2015-05-24 ENCOUNTER — Ambulatory Visit: Payer: Medicare Other | Attending: Internal Medicine | Admitting: Physical Therapy

## 2015-05-24 DIAGNOSIS — R42 Dizziness and giddiness: Secondary | ICD-10-CM | POA: Diagnosis not present

## 2015-05-25 ENCOUNTER — Encounter: Payer: Self-pay | Admitting: Physical Therapy

## 2015-05-25 NOTE — Patient Instructions (Signed)
Gaze Stabilization: Standing Feet Apart ° ° ° °Feet shoulder width apart, keeping eyes on target on wall __6__ feet away, tilt head down 15-30° and move head side to side for _60___ seconds. Repeat while moving head up and down for _60___ seconds. °Do __3__ sessions per day. °Repeat using target on pattern background. ° °Copyright © [employer]. All rights reserved.  ° °

## 2015-05-25 NOTE — Therapy (Signed)
[facility] [address] [place], [place], [address] Phone: [phone]   Fax:  [phone]  Physical Therapy Evaluation  Patient Details  Name: [name] MRN: [id number] Date of Birth: 30-Mar-1951 Referring Provider:  Tina Griffiths, MD  Encounter Date: 05/24/2015      PT End of Session - 05/25/15 1234    Visit Number 1   Number of Visits 1   Authorization Type [employer] Medicare   Authorization Time Period 05-24-15 - 06-24-15   PT Start Time 0932   PT Stop Time 1017   PT Time Calculation (min) 45 min      Past Medical History  Diagnosis Date  . Psoriasis   . High cholesterol   . Eczema     Past Surgical History  Procedure Laterality Date  . Tonsillectomy    . Hand surgery      Carpal tunnel  . Shoulder surgery    . Knee surgery      There were no vitals filed for this visit.  Visit Diagnosis:  Dizziness and giddiness - Plan: PT plan of care cert/re-cert      Subjective Assessment - 05/25/15 1221    Subjective Pt reports initially she had onset of vertigo in early August but states that it has resolved as of this time and she reports no problems with vertigo or mobility - "I don't know why I'm here"   Patient is accompained by: Family member  daughter   Pertinent History TIA? in early August; vascular dementia per daughter's report   Patient Stated Goals "I don't know - I'm doing fine"            [facility] PT Assessment - 05/25/15 0001    Assessment   Medical Diagnosis Vertigo/TIA   Onset Date/Surgical Date --  early August 2016   Precautions   Precautions None   Balance Screen   Has the patient fallen in the past 6 months No   Has the patient had a decrease in activity level because of a fear of falling?  No   Is the patient reluctant to leave their home because of a fear of falling?  No   Home Environment   Living Environment Private residence   Type of Home House   Home Access Stairs  to enter   Entrance Stairs-Number of Steps 3   Entrance Stairs-Rails Right   Home Layout Two level   Prior Function   Level of Independence Independent   Observation/Other Assessments   Focus on Therapeutic Outcomes (FOTO)  63  risk adjusted 52/100   ROM / Strength   AROM / PROM / Strength Strength   Strength   Overall Strength Within functional limits for tasks performed   Ambulation/Gait   Ambulation/Gait Yes   Ambulation/Gait Assistance 7: Independent   Ambulation Distance (Feet) 125 Feet   Assistive device None   Gait Pattern Within Functional Limits   Ambulation Surface Level;Indoor   Stairs Yes   Stairs Assistance 7: Independent   Stair Management Technique No rails   Number of Stairs 4   Height of Stairs 6   Timed Up and Go Test   Normal TUG (seconds) 9.6   Functional Gait  Assessment   Gait Level Surface Walks 20 ft in less than 5.5 sec, no assistive devices, good speed, no evidence for imbalance, normal gait pattern, deviates no more than 6 in outside of the 12 in walkway width.   Change in Gait Speed  Able to smoothly change walking speed without loss of balance or gait deviation. Deviate no more than 6 in outside of the 12 in walkway width.   Gait with Horizontal Head Turns Performs head turns smoothly with no change in gait. Deviates no more than 6 in outside 12 in walkway width   Gait with Vertical Head Turns Performs task with slight change in gait velocity (eg, minor disruption to smooth gait path), deviates 6 - 10 in outside 12 in walkway width or uses assistive device   Gait and Pivot Turn Pivot turns safely within 3 sec and stops quickly with no loss of balance.   Step Over Obstacle Is able to step over 2 stacked shoe boxes taped together (9 in total height) without changing gait speed. No evidence of imbalance.   Gait with Narrow Base of Support Is able to ambulate for 10 steps heel to toe with no staggering.   Gait with Eyes Closed Walks 20 ft, uses assistive  device, slower speed, mild gait deviations, deviates 6-10 in outside 12 in walkway width. Ambulates 20 ft in less than 9 sec but greater than 7 sec.   Ambulating Backwards Walks 20 ft, no assistive devices, good speed, no evidence for imbalance, normal gait   Steps Alternating feet, no rail.   Total Score 28                           PT Education - 05/25/15 1233    Education provided Yes   Education Details gaze stabilization in standing - vertical   Person(s) Educated Patient;Child(ren)   Methods Explanation;Demonstration;Handout   Comprehension Verbalized understanding;Returned demonstration                    Plan - 05/25/15 1236    Clinical Impression Statement Pt has no residual deficits at this time which warrant skilled PT intervention - pt reports no vertigo and balance and gait are WNL's   PT Frequency 1x / week   PT Duration --  1 week - eval only   PT Treatment/Interventions Patient/family education;ADLs/Self Care Home Management   PT Next Visit Plan eval only   PT Home Exercise Plan gaze stabilization in standing   Consulted and Agree with Plan of Care Patient;Family member/caregiver   Family Member Consulted daughter          G-Codes - 04-Jun-2015 1239    Functional Assessment Tool Used Functional gait assessment 28/30; pt reports no vertigo at this time   Functional Limitation Mobility: Walking and moving around   Mobility: Walking and Moving Around Current Status [phone]) At least 1 percent but less than 20 percent impaired, limited or restricted   Mobility: Walking and Moving Around Goal Status [phone]) At least 1 percent but less than 20 percent impaired, limited or restricted   Mobility: Walking and Moving Around Discharge Status [phone]) At least 1 percent but less than 20 percent impaired, limited or restricted       Problem List Patient Active Problem List   Diagnosis Date Noted  . Vertigo 05/02/2015  . Subacute confusional  state 05/02/2015  . Acute encephalopathy   . Memory deficit   . Hyperlipidemia   . Prediabetes   . TIA (transient ischemic attack) 04/16/2015    Roneisha Stern, Donavan Burnet, PT 05/25/2015, 12:42 PM  [facility] [address] [place], [place], [address] Phone: [phone]   Fax:  [phone]

## 2015-06-27 ENCOUNTER — Encounter: Payer: Self-pay | Admitting: Neurology

## 2015-06-27 ENCOUNTER — Ambulatory Visit (INDEPENDENT_AMBULATORY_CARE_PROVIDER_SITE_OTHER): Payer: Medicare Other | Admitting: Neurology

## 2015-06-27 VITALS — BP 165/90 | HR 66 | Ht 65.0 in | Wt 185.6 lb

## 2015-06-27 DIAGNOSIS — R413 Other amnesia: Secondary | ICD-10-CM

## 2015-06-27 DIAGNOSIS — R0683 Snoring: Secondary | ICD-10-CM

## 2015-06-27 DIAGNOSIS — G4719 Other hypersomnia: Secondary | ICD-10-CM | POA: Diagnosis not present

## 2015-06-27 NOTE — Progress Notes (Addendum)
[facility]  Provider: Dr Lucia Gaskins Referring Provider: Layne Benton, NP Primary Care Physician: Dr. Wylene Simmer  CC: memory loss  Interval history 06/27/2015: Here for follow up on memory loss. EEG showed diffuse background theta frequency slowing. Lab testing was unremarkable including rpr, hiv, ammonia, b12, tsh. They followed up with primary care and pcp mentioned vascular dementia was discussed however I stated MRi of the brain was normal for age and MRA of the brain was unremarkable, no significant white matter changes or vascular processes were seen. Will send for neuropsychological testing. She has been under a lot of stress with her husband, husband is an alcoholic. His drinking has worsened. Patient's MMSE was 24/30 at last appointment. Will perform a MoCA today. She has had some vaginal discomfort but no UTI as far as they know.  Her cholesterol was checked. They did bloodwork. No other metabolic/toxic causes of memory loss or eeg slowing was found. BP is 165/90 with P66, advised to call primary care and consider adding medication for her elevated BP. She snores, she is excessively tired during the day, she needs to nap. No dementia in the family, mother is 57 and father died in 77s.   HPI: [name] is a 64 y.o. female here as a referral from Dr. Catha Gosselin for a TIA. She is here with daughter. Daughter said she staggered, head was hanging down, said she was really dizzy, slurring of speech, reported room was spinning. Patient was in her closet trying to figure out what to wear. Patient doesn't remember any of it. No previous incidents of this type. Daughter said she will go to the grocery store and not get anything. She will forget how to set the alarm. Since this incident, patient is more confused. She has not been to a doctor in years previous to hospitalization. Patient denies any new medications. Two weeks before this happened she had surgery on her hand for Dupuytren's  contracture. She gets dizzy and confused in the grocery store. Patient doesn't remember a lot of these things. More short-term memory loss. Things that happened a long time she remembers. They believe patient's mother may have Alzheimers. No depression or mood disorder. No new medications. Memory problems started 6-12 months ago and getting worse. Aspirin had been held for surgery. Started statin in the hospital. Denies headache, any current neurologic symptoms, CP, SOB, visual changes. She is having episodic vertigo and memory changes as above.   Reviewed notes, labs and imaging from outside physicians, which showed:  Per neurology notes inpatient: [name] is a 64 y.o. female who believed to be healthy but has not had any regular medical follow-up for several years. She had surgery on her right hand approximately one week ago performed by Dr. Amanda Pea for a Dupuytren's contracture. The patient's daughter drove over to her mother's house to pick up something. Her mother walked out of the house towards the car and her daughter noted that her mother appeared to be staggering. She helped her mother back in to the house, sat her down, and called 911. The daughter noticed some mild dysarthria and said that this was also noted by the EMTs.She also felt that her mother was mildly confused. The patient felt as if the room was spinning. The patient herself did not remember much about the incident. She remembers working in the house and getting ready to go out to the car; however, she is foggy about the details. The patient's speech was back to normal  in the emergency department, although she still seemed to have some mild cognitive difficulties. When her daughter tried to help her mother get up and go to the bathroom in the ER the patient was once again dizzy. She does not feel dizzy lying down. The patient's daughter estimates that the worst of these symptoms lasted approximately 2 hours. The patient denied any  recent headaches, palpitations, nausea or vomiting, visual changes, speech difficulties, changes in hearing, abnormal sounds or ringing in her ears.  The patient's husband and daughter both report that the patient's memory has been getting progressively worse over the past 6 months to one year. She had been taking aspirin 81 mg daily but this was recently held for her hand surgery.  MRI HEAD FINDINGS personally reviewed images and agree with the following.   The ventricles and sulci are normal for patient's age. No abnormal parenchymal signal, mass lesions, mass effect. No reduced diffusion to suggest acute ischemia. No susceptibility artifact to suggest hemorrhage.  No abnormal extra-axial fluid collections. No extra-axial masses though, contrast enhanced sequences would be more sensitive. Normal major intracranial vascular flow voids seen at the skull base.  Ocular globes and orbital contents are unremarkable though not tailored for evaluation. No abnormal sellar expansion. Visualized paranasal sinuses and mastoid air cells are well-aerated, LEFT concha bullosa. No suspicious calvarial bone marrow signal. No abnormal sellar expansion. Craniocervical junction maintained.  MRA HEAD FINDINGS  Anterior circulation: Normal flow related enhancement of the included cervical, petrous, cavernous and supra clinoid internal carotid arteries. Very mild luminal irregularity LEFT cavernous carotid artery corresponding to calcific atherosclerosis on prior head CT. Patent anterior communicating artery. Normal flow related enhancement of the anterior and middle cerebral arteries, including more distal segments.  No large vessel occlusion, high-grade stenosis, abnormal luminal irregularity, aneurysm.  Posterior circulation: LEFT vertebral artery is dominant. Basilar artery is patent, with normal flow related enhancement of the main branch vessels. Fetal origin RIGHT posterior cerebral artery.  Small LEFT posterior communicating artery present. Normal flow related enhancement of the posterior cerebral arteries.  No large vessel occlusion, high-grade stenosis, abnormal luminal irregularity, aneurysm.  IMPRESSION: MRI HEAD: No acute intracranial process; normal noncontrast MRI of the brain for age.  MRA HEAD: No acute large vessel occlusion or high-grade stenosis ; complete circle of Willis.  hgba1c 6 LDL unable to calculate given such high triglycerides B12 349 TSH 2.5 CBC,CMP unremarkable  Study Conclusions  - Left ventricle: The cavity size was normal. There was moderate concentric hypertrophy. Systolic function was normal. The estimated ejection fraction was in the range of 55% to 60%. Wall motion was normal; there were no regional wall motion abnormalities.  Impressions:  - No cardiac source of emboli was indentified.  Review of Systems: Patient complains of symptoms per HPI as well as the following symptoms: weight loss, CP, snoring, rash, confusion, dizziness. Pertinent negatives per HPI. All others negative.   Social History   Social History  . Marital Status: Married    Spouse Name: N/A  . Number of Children: 2  . Years of Education: 12   Occupational History  . Not on file.   Social History Main Topics  . Smoking status: Former Smoker    Quit date: 08/20/2004  . Smokeless tobacco: Not on file  . Alcohol Use: 0.0 oz/week    0 Standard drinks or equivalent per week  . Drug Use: No  . Sexual Activity: Yes   Other Topics Concern  . Not on file  Social History Narrative   Lives at home with husband.    Caffeine use: minimal    Family History  Problem Relation Age of Onset  . Cancer Father     Past Medical History  Diagnosis Date  . Psoriasis   . High cholesterol   . Eczema     Past Surgical History  Procedure Laterality Date  . Tonsillectomy    . Hand surgery      Carpal tunnel  . Shoulder surgery    . Knee surgery       Current Outpatient Prescriptions  Medication Sig Dispense Refill  . aspirin EC 81 MG tablet Take 1 tablet (81 mg total) by mouth daily. 30 tablet 6  . atorvastatin (LIPITOR) 80 MG tablet Take 1 tablet (80 mg total) by mouth daily. 90 tablet 0  . clobetasol cream (TEMOVATE) 0.05 % Apply 1 application topically as needed.  3   No current facility-administered medications for this visit.    Allergies as of 06/27/2015 - Review Complete 05/25/2015  Allergen Reaction Noted  . Ibuprofen  03/28/2013    Vitals: BP 165/90 mmHg  Pulse 66  Ht 5\' 5"  (1.651 m)  Wt 185 lb 9.6 oz (84.188 kg)  BMI 30.89 kg/m2 Last Weight:  Wt Readings from Last 1 Encounters:  06/27/15 185 lb 9.6 oz (84.188 kg)   Last Height:   Ht Readings from Last 1 Encounters:  06/27/15 5\' 5"  (1.651 m)    MMSE - Mini Mental State Exam 05/02/2015  Orientation to time 3  Orientation to Place 4  Registration 3  Attention/ Calculation 5  Recall 0  Language- name 2 objects 2  Language- repeat 1  Language- follow 3 step command 3  Language- read & follow direction 1  Write a sentence 1  Copy design 1  Total score 24   No flowsheet data found.  Cranial Nerves:  The pupils are equal, round, and reactive to light. The fundi are flat. Visual fields are full to finger confrontation. Extraocular movements are intact. Trigeminal sensation is intact and the muscles of mastication are normal. The face is symmetric. The palate elevates in the midline. Hearing intact. Voice is normal. Shoulder shrug is normal. The tongue has normal motion without fasciculations.   Coordination:  Normal finger to nose and heel to shin. Normal rapid alternating movements.   Gait:  Heel-toe and tandem gait are normal.   Motor Observation:  No asymmetry, no atrophy, and no involuntary movements noted. Tone:  Normal muscle tone.   Posture:  Posture is normal. normal erect   Strength:  Strength is V/V in the upper and  lower limbs.    Sensation: intact to LT   Reflex Exam:  DTR's:  Deep tendon reflexes in the upper and lower extremities are normal bilaterally.  Toes:  The toes are downgoing bilaterally.  Clonus:  Clonus is absent.    ASSESSMENT/PLAN [name] is a 64 y.o. female with history of his tobacco history, psoriasis, and recent right hand surgery presented with transient staggering gait, dizziness, dysarthria, and mild confusion with progressive memory issues over the past 6-12 months to the ED. Imaging was negative and patient was diagnosed with possible TIA Her biggest concern is the memory issues over the last 6-12 months which may be complicated by depression due to her husband's alcoholism.MoCA was 24 out of 30 and patient denies any memory loss. She thinks she is just under a lot of stress right now. She is  here with her daughter in the office who may also think mother is just under a significant amount of stress.   MRI and MRA showed no acute processes. MRI of the brain was essentially normal.no significant white matter changes or vascular processes were seen. TSH and B12 are normal. No toxic/metabolic anomalies.  Neeeds to follow closely with primary care for management of vascular risk factors. Continue aspirin daily for stroke prevention. Patient would like neurocognitive testing to further evaluate her memory changes. She snores significantly and is excessively tired, will refer to sleep studies.  Naomie Dean, MD  [facility] [address] [place], [place] [address]  Phone [phone] Fax [phone]  A total of 30 minutes was spent face-to-face with this patient. Over half this time was spent on counseling patient on the memory loss diagnosis and different diagnostic and therapeutic options available.

## 2015-06-27 NOTE — Patient Instructions (Signed)
Overall you are doing fairly well but I do want to suggest a few things today:   Remember to drink plenty of fluid, eat healthy meals and do not skip any meals. Try to eat protein with a every meal and eat a healthy snack such as fruit or nuts in between meals. Try to keep a regular sleep-wake schedule and try to exercise daily, particularly in the form of walking, 20-30 minutes a day, if you can.   As far as diagnostic testing: neuropsych testing, sleep eval  I would like to see you back after workup, sooner if we need to. Please call us with any interim questions, concerns, problems, updates or refill requests.   Our phone number is [phone]. We also have an after hours call service for urgent matters and there is a physician on-call for urgent questions. For any emergencies you know to call 911 or go to the nearest emergency room

## 2015-08-02 ENCOUNTER — Telehealth: Payer: Self-pay | Admitting: Neurology

## 2015-08-02 DIAGNOSIS — R413 Other amnesia: Secondary | ICD-10-CM

## 2015-08-02 NOTE — Telephone Encounter (Signed)
Called to speak to The PlainsJennifer, daughter. Please ask if pt had neurocognitive testing done and who pt saw for it. Ask they have report faxed to us. Gave [facility] phone number.

## 2015-08-02 NOTE — Telephone Encounter (Signed)
Scheduled f/u with Megan on 12/15 at 10am, check in 945am. Daughter verbalized understanding.

## 2015-08-02 NOTE — Telephone Encounter (Signed)
Patient's daughter [name] is calling as she was told her mother needs a sleep study and a PET scan. She was told the sleep scan is being worked on. She is very concerned and states her mother is getting much worse, more forgetful, forgetting words, untrue stores, they are having to repeat things to her more often. Please call daughter.  Thanks!

## 2015-08-02 NOTE — Telephone Encounter (Signed)
Per Dr Lucia GaskinsAhern- pt can come in for f/u to see NP

## 2015-08-02 NOTE — Telephone Encounter (Signed)
I think I suggested a pet-FDG scan to look for dementia. However I did order neurocognitive testing. Did they get that done? Can you see where the referral was sent and see if she was seen there and if they have a report? thanks

## 2015-08-02 NOTE — Telephone Encounter (Signed)
Called and spoke to daughter, Victorino DikeJennifer. She states her mother is getting more angry more often. Her Dad is off work today and Advertising account executivetomorrow. She has called her daughter every cuss word today father states. Their heat went out two days ago. Yesterday, pt told husband that the guy working on heat told her that if its not working, not to worry about it. Daughter thinks she is getting things mixed up. She also had appt for car to fix some things. Now today, she told husband they can go whenever, they do not have appt scheduled. Husband knows they have appt so he thinks she is forgetting these things. She said Dr Lucia GaskinsAhern suggested a PET scan/study.  States she started getting worse about a couple weeks ago. Went to PCP for 6 month f/u around time she saw Dr Lucia GaskinsAhern last. Denies any urination problems (burning pain/trouble urinating). Told her I will speak to Dr Lucia GaskinsAhern and call her back. She verbalized understanding.

## 2015-08-03 ENCOUNTER — Institutional Professional Consult (permissible substitution): Payer: Medicare Other | Admitting: Neurology

## 2015-08-03 NOTE — Telephone Encounter (Signed)
We can refer to [place]. i know Dr. Leonides CaveZelson is very backed up.  Thanks.

## 2015-08-03 NOTE — Telephone Encounter (Signed)
Called Dr Leonides CaveZelson office about neuropysch testing referral. It never went to their referral que. She is going to print it off and give to Dr Leonides CaveZelson to review to see if he can see the pt.

## 2015-08-03 NOTE — Telephone Encounter (Signed)
Per Dr Lucia GaskinsAhern- [facility] in [place] for new referral.

## 2015-08-03 NOTE — Telephone Encounter (Signed)
LVM for Yvette DikeJennifer to call back.  Phone staff: please let her know that I called Dr zelson and he is going to review referral to see if she can be seen there. Referral never went to there que for some reason. They should get a call from their office.

## 2015-08-03 NOTE — Telephone Encounter (Signed)
Yvette DikeJennifer returned Colgate-PalmoliveEmma's call. She sts the only testing pt has had is an EEG at [facility]. Please call and advise

## 2015-08-03 NOTE — Telephone Encounter (Signed)
Placed new referral to [facility] at [facility].

## 2015-08-03 NOTE — Telephone Encounter (Signed)
Victorino DikeJennifer, patient's daughter returned a call. I advised her of the message below regarding the patient and she should receive a call from Dr. Maxwell MarionZelson's office regarding a referral there for the patient.

## 2015-08-04 ENCOUNTER — Ambulatory Visit: Payer: Self-pay | Admitting: Adult Health

## 2015-08-05 ENCOUNTER — Encounter: Payer: Self-pay | Admitting: Adult Health

## 2015-08-08 NOTE — Telephone Encounter (Signed)
Patient referral has been sent to Dr. Jacquelyne BalintMcdermott telephone [phone]. Fax [phone].

## 2015-08-09 ENCOUNTER — Institutional Professional Consult (permissible substitution): Payer: Medicare Other | Admitting: Neurology

## 2015-08-24 ENCOUNTER — Institutional Professional Consult (permissible substitution): Payer: Medicare Other | Admitting: Neurology

## 2015-09-05 ENCOUNTER — Institutional Professional Consult (permissible substitution): Payer: Medicare Other | Admitting: Neurology

## 2015-09-05 ENCOUNTER — Encounter: Payer: Self-pay | Admitting: Neurology

## 2015-09-05 ENCOUNTER — Ambulatory Visit (INDEPENDENT_AMBULATORY_CARE_PROVIDER_SITE_OTHER): Payer: Medicare Other | Admitting: Neurology

## 2015-09-05 VITALS — BP 154/96 | HR 78 | Resp 16 | Ht 65.0 in | Wt 182.0 lb

## 2015-09-05 DIAGNOSIS — R419 Unspecified symptoms and signs involving cognitive functions and awareness: Secondary | ICD-10-CM

## 2015-09-05 DIAGNOSIS — E669 Obesity, unspecified: Secondary | ICD-10-CM

## 2015-09-05 DIAGNOSIS — R0683 Snoring: Secondary | ICD-10-CM | POA: Diagnosis not present

## 2015-09-05 DIAGNOSIS — G458 Other transient cerebral ischemic attacks and related syndromes: Secondary | ICD-10-CM | POA: Diagnosis not present

## 2015-09-05 NOTE — Progress Notes (Signed)
Subjective:    Patient ID: [name] is a 65 y.o. female.  HPI     [name], MD, PhD [facility] [address], [address] [address] [place], [place] [address]  Dear Desma Maxim,   I saw your patient, [name], upon your kind request in my clinic today for initial consultation of her sleep disorder, in particular, concern for underlying obstructive sleep apnea. The patient is accompanied by her daughter, [name], and 2 granddaughters today. As you know, [name] is a 64 year old right-handed woman with an underlying medical history of hyperlipidemia, psoriasis, remote history of smoking, recent diagnosis of TIA in August 2016, and obesity, who is reported to snore. She states that she feels adequately rested. She has no overt complaints about her sleep. She denies restless leg symptoms. She does not report any leg twitching at night. She lives with her husband and they sleep in separate beds. Bedtime is around 10 PM and rise time is between 7 and 8 AM. Her Epworth sleepiness score is 1 out of 24, her fatigue score is 9 out of 63. She had a tonsillectomy. She drinks coffee, usually 1 cup in the morning and sweet tea during the day, on average 2 glasses. She admits that she does not drink enough water. She quit smoking about 10 years ago, she does not drink alcohol. She has had memory complaints. She reports left knee pain and has seen Dr. Despina Hick for this. She will be undergoing a knee MRI soon.  She denies morning headaches or nocturia. She denies a family history of OSA.   I reviewed your office note from 06/27/2015.  Her Past Medical History Is Significant For: Past Medical History  Diagnosis Date  . Psoriasis   . High cholesterol   . Eczema   . High cholesterol     Her Past Surgical History Is Significant For: Past Surgical History  Procedure Laterality Date  . Tonsillectomy    . Hand surgery      Carpal tunnel  . Shoulder surgery    . Knee  surgery      Her Family History Is Significant For: Family History  Problem Relation Age of Onset  . Cancer Father     Her Social History Is Significant For: Social History   Social History  . Marital Status: Married    Spouse Name: N/A  . Number of Children: 2  . Years of Education: 12   Social History Main Topics  . Smoking status: Former Smoker    Quit date: 08/20/2004  . Smokeless tobacco: None  . Alcohol Use: 0.0 oz/week    0 Standard drinks or equivalent per week  . Drug Use: No  . Sexual Activity: Yes   Other Topics Concern  . None   Social History Narrative   Lives at home with husband.    Drinks coffee daily     Her Allergies Are:  Allergies  Allergen Reactions  . Ibuprofen     dizzy  :   Her Current Medications Are:  Outpatient Encounter Prescriptions as of 09/05/2015  Medication Sig  . aspirin EC 81 MG tablet Take 1 tablet (81 mg total) by mouth daily.  Marland Kitchen atorvastatin (LIPITOR) 80 MG tablet Take 1 tablet (80 mg total) by mouth daily.  . clobetasol cream (TEMOVATE) 0.05 % Apply 1 application topically as needed.   No facility-administered encounter medications on file as of 09/05/2015.  :  Review of Systems:  Out of a  complete 14 point review of systems, all are reviewed and negative with the exception of these symptoms as listed below:   Review of Systems  Neurological:       Snoring, rarely takes nap.   Epworth Sleepiness Scale 0= would never doze 1= slight chance of dozing 2= moderate chance of dozing 3= high chance of dozing  Sitting and reading:0 Watching TV:0 Sitting inactive in a public place (ex. Theater or meeting):0 As a passenger in a car for an hour without a break:0 Lying down to rest in the afternoon:1 Sitting and talking to someone:0 Sitting quietly after lunch (no alcohol):0 In a car, while stopped in traffic:0 Total:1   Objective:  Neurologic Exam  Physical Exam Physical Examination:   Filed Vitals:   09/05/15  0908  BP: 154/96  Pulse: 78  Resp: 16    General Examination: The patient is a very pleasant 65 y.o. female in no acute distress. She appears well-developed and well-nourished and well groomed. She is rather quiet, not very forthcoming with her history.  HEENT: Normocephalic, atraumatic, pupils are equal, round and reactive to light and accommodation. Extraocular tracking is good without limitation to gaze excursion or nystagmus noted. Normal smooth pursuit is noted. Hearing is grossly intact. Face is symmetric with normal facial animation and normal facial sensation. Speech is clear with no dysarthria noted. There is no hypophonia. There is no lip, neck/head, jaw or voice tremor. Neck is supple with full range of passive and active motion. There are no carotid bruits on auscultation. Oropharynx exam reveals: moderate mouth dryness, adequate dental hygiene and mild airway crowding, due to narrow airway entry and redundant soft palate. Uvula is actually small, tonsils are absent. Mallampati is class II. Tongue protrudes centrally and palate elevates symmetrically. Neck size is 15.5 inches.   Chest: Clear to auscultation without wheezing, rhonchi or crackles noted.  Heart: S1+S2+0, regular and normal without murmurs, rubs or gallops noted.   Abdomen: Soft, non-tender and non-distended with normal bowel sounds appreciated on auscultation.  Extremities: There is L knee pain. She is wearing a brace around her right wrist.   Skin: Warm and dry without trophic changes noted.  Musculoskeletal: exam reveals: Left knee pain and mild decrease in range of motion of the left knee.   Neurologically:  Mental status: The patient is awake, alert and oriented in all 4 spheres. Her immediate and remote memory, attention, language skills and fund of knowledge are fairly appropriate. There is no evidence of aphasia, agnosia, apraxia or anomia. Speech is clear with normal prosody and enunciation. Thought process is  linear. Mood is constricted and affect is normal.  Cranial nerves II - XII are as described above under HEENT exam. In addition: shoulder shrug is normal with equal shoulder height noted. Motor exam: Normal bulk, strength and tone is noted. There is no drift, tremor or rebound. Romberg is negative. Reflexes are 2+ throughout. Babinski: Toes are flexor bilaterally. Fine motor skills and coordination: grossly intact.  Cerebellar testing: No dysmetria or intention tremor on finger to nose testing. Heel to shin is unremarkable bilaterally. There is no truncal or gait ataxia.  Sensory exam: intact to light touch in the upper and lower extremities.  Gait, station and balance: She stands with mild difficulty. No veering to one side is noted. No leaning to one side is noted. Posture is age-appropriate and stance is narrow based. Gait shows slight limp on the L.   Assessment and Plan:   In summary, [name] is a very pleasant 65 y.o.-year old female with an underlying medical history of hyperlipidemia, psoriasis, remote history of smoking, recent diagnosis of TIA, and obesity, whose history and physical exam are indeed concerning for obstructive sleep apnea (OSA). I had a long chat with the patient and her daughter about my findings and the diagnosis of OSA, its prognosis and treatment options. I explained in particular the risks and ramifications of untreated moderate to severe OSA, especially with respect to developing cardiovascular disease down the Road, including congestive heart failure, difficult to treat hypertension, cardiac arrhythmias, or stroke. Even type 2 diabetes has, in part, been linked to untreated OSA. Symptoms of untreated OSA include daytime sleepiness, memory problems, mood irritability and mood disorder such as depression and anxiety, lack of energy, as well as recurrent headaches, especially morning headaches. We talked about trying to maintain a halthy lifestyle in general, as well  as the importance of weight control. I encouraged the patient to eat healthy, exercise daily and keep well hydrated, to keep a scheduled bedtime and wake time routine, to not skip any meals and eat healthy snacks in between meals. I advised the patient not to drive when feeling sleepy. I recommended the following at this time: sleep study with potential positive airway pressure titration. (We will score hypopneas at 4% and split the sleep study into diagnostic and treatment portion, if the estimated. 2 hour AHI is >15/h). However, the patient does not wish to proceed with a sleep study. I suggested, she think about it some more, discuss this with her family and call us back when she is ready to schedule.   I explained the sleep test procedure to the patient and also explained the CPAP treatment option to the patient.  I answered all their questions today and the patient and her daughter were in agreement.  Thank you very much for allowing me to participate in the care of this nice patient. If I can be of any further assistance to you please do not hesitate to talk to me.  Sincerely,   Huston FoleySaima Etienne Millward, MD, PhD

## 2015-09-05 NOTE — Patient Instructions (Signed)

## 2015-09-29 DIAGNOSIS — Z6829 Body mass index (BMI) 29.0-29.9, adult: Secondary | ICD-10-CM | POA: Diagnosis not present

## 2015-09-29 DIAGNOSIS — R030 Elevated blood-pressure reading, without diagnosis of hypertension: Secondary | ICD-10-CM | POA: Diagnosis not present

## 2015-10-03 DIAGNOSIS — R413 Other amnesia: Secondary | ICD-10-CM | POA: Diagnosis not present

## 2015-10-05 DIAGNOSIS — Y939 Activity, unspecified: Secondary | ICD-10-CM | POA: Diagnosis not present

## 2015-10-05 DIAGNOSIS — S83272A Complex tear of lateral meniscus, current injury, left knee, initial encounter: Secondary | ICD-10-CM | POA: Diagnosis not present

## 2015-10-05 DIAGNOSIS — M1712 Unilateral primary osteoarthritis, left knee: Secondary | ICD-10-CM | POA: Diagnosis not present

## 2015-10-05 DIAGNOSIS — Y929 Unspecified place or not applicable: Secondary | ICD-10-CM | POA: Diagnosis not present

## 2015-10-05 DIAGNOSIS — R200 Anesthesia of skin: Secondary | ICD-10-CM | POA: Diagnosis not present

## 2015-10-05 DIAGNOSIS — M2242 Chondromalacia patellae, left knee: Secondary | ICD-10-CM | POA: Diagnosis not present

## 2015-10-05 DIAGNOSIS — Y999 Unspecified external cause status: Secondary | ICD-10-CM | POA: Diagnosis not present

## 2015-10-05 DIAGNOSIS — M659 Synovitis and tenosynovitis, unspecified: Secondary | ICD-10-CM | POA: Diagnosis not present

## 2015-10-05 DIAGNOSIS — G5601 Carpal tunnel syndrome, right upper limb: Secondary | ICD-10-CM | POA: Diagnosis not present

## 2015-10-05 DIAGNOSIS — M23301 Other meniscus derangements, unspecified lateral meniscus, left knee: Secondary | ICD-10-CM | POA: Diagnosis not present

## 2015-10-05 DIAGNOSIS — G5621 Lesion of ulnar nerve, right upper limb: Secondary | ICD-10-CM | POA: Diagnosis not present

## 2015-10-19 ENCOUNTER — Telehealth: Payer: Self-pay

## 2015-10-19 NOTE — Telephone Encounter (Signed)
I left a message for the patient to return my call.

## 2015-10-23 ENCOUNTER — Telehealth: Payer: Self-pay | Admitting: Neurology

## 2015-10-23 NOTE — Telephone Encounter (Signed)
[name], I received a report from neurocognitive testing. I would like patient to follow up with me to discuss the findings. We will ask her to see me in the office? Please let me know thinks

## 2015-10-24 ENCOUNTER — Telehealth: Payer: Self-pay

## 2015-10-24 NOTE — Telephone Encounter (Signed)
I spoke to the patient's daughter, Rushie NyhanJennifer Garfinkle, in re the CREAD study. I emailed her the ICF as requested and will contact her in a week to see if interested.

## 2015-10-24 NOTE — Telephone Encounter (Signed)
Called and spoke to daughter. Relayed message below. She advised pt was not happy about testing. She felt it was "childish". She is going to call back to schedule f/u with Dr Lucia GaskinsAhern. PLEASE SCHEDULE if she calls back.

## 2015-10-24 NOTE — Telephone Encounter (Signed)
I left a message for the patient to return my call.

## 2015-10-25 DIAGNOSIS — R030 Elevated blood-pressure reading, without diagnosis of hypertension: Secondary | ICD-10-CM | POA: Diagnosis not present

## 2015-10-26 NOTE — Telephone Encounter (Signed)
FYI

## 2015-10-26 NOTE — Telephone Encounter (Signed)
Pt scheduled for f/u 3/15 at 9am. Advised to arrive at 845am per phone staff.

## 2015-10-28 DIAGNOSIS — R030 Elevated blood-pressure reading, without diagnosis of hypertension: Secondary | ICD-10-CM | POA: Diagnosis not present

## 2015-10-31 ENCOUNTER — Telehealth: Payer: Self-pay

## 2015-10-31 NOTE — Telephone Encounter (Signed)
I left a message for the patient to return my call.

## 2015-11-02 ENCOUNTER — Encounter: Payer: Self-pay | Admitting: Neurology

## 2015-11-02 ENCOUNTER — Ambulatory Visit (INDEPENDENT_AMBULATORY_CARE_PROVIDER_SITE_OTHER): Payer: Medicare Other | Admitting: Neurology

## 2015-11-02 ENCOUNTER — Telehealth: Payer: Self-pay

## 2015-11-02 VITALS — BP 182/85 | HR 70 | Wt 177.6 lb

## 2015-11-02 DIAGNOSIS — G309 Alzheimer's disease, unspecified: Secondary | ICD-10-CM | POA: Diagnosis not present

## 2015-11-02 DIAGNOSIS — F0280 Dementia in other diseases classified elsewhere without behavioral disturbance: Secondary | ICD-10-CM | POA: Insufficient documentation

## 2015-11-02 MED ORDER — DONEPEZIL HCL 10 MG PO TABS: 10.0000 mg | ORAL_TABLET | Freq: Every day | ORAL | Status: DC

## 2015-11-02 NOTE — Progress Notes (Signed)
[facility]    Provider: Dr Lucia Gaskins Referring Provider: Layne Benton, NP Primary Care Physician: Dr. 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  Her scores with predominantly memory disruption and executive dysfunction, as well as the pattern of her language scores, with intact letter fluency but impairment in confrontation naming and semantic fluency, is suggestive of temporal lobe dysfunction as would be seen in Alzheimer's disease. The reported course of her symptoms is consistent with what is expected in Alzheimer's disease. It is also unexpected that the level of cognitive disruption that she is fully managing her ADLs such as finances without trouble. At this time there is no evidence of potential new disruption that would adversely impact her cognitive functioning. Patient should attempt to minimize distractions in her environment. She should not be driving.  Interval history 06/27/2015: Here for follow up on memory loss. EEG showed diffuse background theta frequency slowing. Lab testing was unremarkable including rpr, hiv, ammonia, b12, tsh. They followed up with primary care and pcp mentioned vascular dementia was discussed however I stated MRi of the brain was normal for age and MRA of the brain was unremarkable, no significant white matter changes or vascular processes were seen. Will send for neuropsychological testing. She has been under a lot of stress with her husband, husband is an alcoholic. His drinking has worsened. Patient's MMSE was 24/30 at last appointment. Will perform a MoCA today. She has had some vaginal discomfort but no UTI as far as they know. Her cholesterol was checked. They did bloodwork. No other metabolic/toxic causes of memory loss or eeg slowing was found. BP is 165/90 with  P66, advised to call primary care and consider adding medication for her elevated BP. She snores, she is excessively tired during the day, she needs to nap. No dementia in the family, mother is 45 and father died in 20s.   HPI: FE OKUBO is a 65 y.o. female here as a referral from Dr. Catha Gosselin for a TIA. She is here with daughter. Daughter said she staggered, head was hanging down, said she was really dizzy, slurring of speech, reported room was spinning. Patient was in her closet trying to figure out what to wear. Patient doesn't remember any of it. No previous incidents of this type. Daughter said she will go to the grocery store and not get anything. She will forget how to set the alarm. Since this incident, patient is more confused. She has not been to a doctor in years previous to hospitalization. Patient denies any new medications. Two weeks before this happened she had surgery on her hand for Dupuytren's contracture. She gets dizzy and confused in the grocery store. Patient doesn't remember a lot of these things. More short-term memory loss. Things that happened a long time she remembers. They believe patient's mother may have Alzheimers. No depression or mood disorder. No new medications. Memory problems started 6-12 months ago and getting worse. Aspirin had been held for surgery. Started statin in the hospital. Denies headache, any current neurologic symptoms, CP, SOB, visual changes. She is having episodic vertigo and memory changes as above.   Reviewed notes, labs and imaging from outside physicians, which showed:  Per neurology notes inpatient: BASMA BUCHNER is a 65 y.o. female who believed to be healthy but has not had any regular medical follow-up for several years.  She had surgery on her right hand approximately one week ago performed by Dr. Amanda Pea for a Dupuytren's contracture. The patient's daughter drove over to her mother's house to pick up something. Her mother walked out of the house  towards the car and her daughter noted that her mother appeared to be staggering. She helped her mother back in to the house, sat her down, and called 911. The daughter noticed some mild dysarthria and said that this was also noted by the EMTs.She also felt that her mother was mildly confused. The patient felt as if the room was spinning. The patient herself did not remember much about the incident. She remembers working in the house and getting ready to go out to the car; however, she is foggy about the details. The patient's speech was back to normal in the emergency department, although she still seemed to have some mild cognitive difficulties. When her daughter tried to help her mother get up and go to the bathroom in the ER the patient was once again dizzy. She does not feel dizzy lying down. The patient's daughter estimates that the worst of these symptoms lasted approximately 2 hours. The patient denied any recent headaches, palpitations, nausea or vomiting, visual changes, speech difficulties, changes in hearing, abnormal sounds or ringing in her ears.  The patient's husband and daughter both report that the patient's memory has been getting progressively worse over the past 6 months to one year. She had been taking aspirin 81 mg daily but this was recently held for her hand surgery.  MRI HEAD FINDINGS personally reviewed images and agree with the following.   The ventricles and sulci are normal for patient's age. No abnormal parenchymal signal, mass lesions, mass effect. No reduced diffusion to suggest acute ischemia. No susceptibility artifact to suggest hemorrhage.  No abnormal extra-axial fluid collections. No extra-axial masses though, contrast enhanced sequences would be more sensitive. Normal major intracranial vascular flow voids seen at the skull base.  Ocular globes and orbital contents are unremarkable though not tailored for evaluation. No abnormal sellar expansion. Visualized  paranasal sinuses and mastoid air cells are well-aerated, LEFT concha bullosa. No suspicious calvarial bone marrow signal. No abnormal sellar expansion. Craniocervical junction maintained.  MRA HEAD FINDINGS  Anterior circulation: Normal flow related enhancement of the included cervical, petrous, cavernous and supra clinoid internal carotid arteries. Very mild luminal irregularity LEFT cavernous carotid artery corresponding to calcific atherosclerosis on prior head CT. Patent anterior communicating artery. Normal flow related enhancement of the anterior and middle cerebral arteries, including more distal segments.  No large vessel occlusion, high-grade stenosis, abnormal luminal irregularity, aneurysm.  Posterior circulation: LEFT vertebral artery is dominant. Basilar artery is patent, with normal flow related enhancement of the main branch vessels. Fetal origin RIGHT posterior cerebral artery. Small LEFT posterior communicating artery present. Normal flow related enhancement of the posterior cerebral arteries.  No large vessel occlusion, high-grade stenosis, abnormal luminal irregularity, aneurysm.  IMPRESSION: MRI HEAD: No acute intracranial process; normal noncontrast MRI of the brain for age.  MRA HEAD: No acute large vessel occlusion or high-grade stenosis ; complete circle of Willis.  hgba1c 6 LDL unable to calculate given such high triglycerides B12 349 TSH 2.5 CBC,CMP unremarkable  Study Conclusions  - Left ventricle: The cavity size was normal. There was moderate concentric hypertrophy. Systolic function was normal. The estimated ejection fraction was in the range of 55% to 60%. Wall motion was normal; there were no regional wall motion abnormalities.  Impressions:  -  No cardiac source of emboli was indentified.  Review of Systems: Patient complains of symptoms per HPI as well as the following symptoms: weight loss, CP, snoring, rash, confusion,  dizziness. Pertinent negatives per HPI. All others negative.   Social History   Social History  . Marital Status: Married    Spouse Name: N/A  . Number of Children: 2  . Years of Education: 12   Occupational History  . Not on file.   Social History Main Topics  . Smoking status: Former Smoker    Quit date: 08/20/2004  . Smokeless tobacco: Not on file  . Alcohol Use: 0.0 oz/week    0 Standard drinks or equivalent per week  . Drug Use: No  . Sexual Activity: Yes   Other Topics Concern  . Not on file   Social History Narrative   Lives at home with husband.    Drinks coffee daily     Family History  Problem Relation Age of Onset  . Cancer Father     Past Medical History  Diagnosis Date  . Psoriasis   . High cholesterol   . Eczema   . High cholesterol     Past Surgical History  Procedure Laterality Date  . Tonsillectomy    . Hand surgery      Carpal tunnel  . Shoulder surgery    . Knee surgery      Current Outpatient Prescriptions  Medication Sig Dispense Refill  . aspirin EC 81 MG tablet Take 1 tablet (81 mg total) by mouth daily. 30 tablet 6  . atorvastatin (LIPITOR) 80 MG tablet Take 1 tablet (80 mg total) by mouth daily. 90 tablet 0  . clobetasol cream (TEMOVATE) 0.05 % Apply 1 application topically as needed.  3  . losartan (COZAAR) 50 MG tablet Take 50 mg by mouth daily.  5   No current facility-administered medications for this visit.    Allergies as of 11/02/2015 - Review Complete 11/02/2015  Allergen Reaction Noted  . Ibuprofen  03/28/2013    Vitals: BP 182/85 mmHg  Pulse 70  Wt 177 lb 9.6 oz (80.559 kg)  SpO2 97% Last Weight:  Wt Readings from Last 1 Encounters:  11/02/15 177 lb 9.6 oz (80.559 kg)   Last Height:   Ht Readings from Last 1 Encounters:  09/05/15 5\' 5"  (1.651 m)   MMSE - Mini Mental State Exam 06/27/2015 05/02/2015  Orientation to time 4 3  Orientation to Place 5 4  Registration 3 3  Attention/ Calculation 4 5    Recall 0 0  Language- name 2 objects 2 2  Language- repeat 1 1  Language- follow 3 step command 3 3  Language- read & follow direction 1 1  Write a sentence 1 1  Copy design 1 1  Total score 25 24    Cranial Nerves:  The pupils are equal, round, and reactive to light. The fundi are flat. Visual fields are full to finger confrontation. Extraocular movements are intact. Trigeminal sensation is intact and the muscles of mastication are normal. The face is symmetric. The palate elevates in the midline. Hearing intact. Voice is normal. Shoulder shrug is normal. The tongue has normal motion without fasciculations.   Coordination:  Normal finger to nose and heel to shin. Normal rapid alternating movements.   Gait:  Heel-toe and tandem gait are normal.   Motor Observation:  No asymmetry, no atrophy, and no involuntary movements noted. Tone:  Normal muscle tone.   Posture:  Posture is normal. normal erect   Strength:  Strength is V/V in the upper and lower limbs.    Sensation: intact to LT   Reflex Exam:  DTR's:  Deep tendon reflexes in the upper and lower extremities are normal bilaterally.  Toes:  The toes are downgoing bilaterally.  Clonus:  Clonus is absent.    ASSESSMENT/PLAN Ms. MERLIN EGE is a 65 y.o. female with history of his tobacco history, psoriasis, and recent right hand surgery presented with transient staggering gait, dizziness, dysarthria, and mild confusion with progressive memory issues over the past 6-12 months to the ED. Imaging was negative and patient was diagnosed with possible TIA Her biggest concern is the memory issues over the last 6-12 months which may be complicated by depression due to her husband's alcoholism.MoCA was 24 out of 30 and patient denies any memory loss. She thinks she is just under a lot of stress right now. She is here with her daughter in the office who may also think mother is just under a  significant amount of stress. However patient is very concerned about dementia.  Neurocognitive testing c/w Major Neurocognitive disorder likely Alzheimer's dementia, given the magnitude of breath of cognitive impairment it is surprising MRI of her brain was unremarkable.. In discussion with patient, her son and daughter today. Patient was very upset as understandable. Discussed Alzheimer's and the natural progression. We'll start Aricept. Namenda may also be considered given the magnitude of cognitive deficits displayed.  Given her market visual spatial and executive disruption it is recommended she refrain from driving. Unless she passed formal driving evaluation. Provided a referral for patient. If she should not drive until she has is completed.  MRI and MRA showed no acute processes. MRI of the brain was essentially normal.no significant white matter changes or vascular processes were seen. TSH and B12 are normal. No toxic/metabolic anomalies.  Neeeds to follow closely with primary care for management of vascular risk factors. Continue aspirin daily for stroke prevention.. She snores significantly and is excessively tired, will refer to sleep studies.      Assessment/Plan:    Naomie Dean, MD  [facility] [address] [place], [place] [address]  Phone [phone] Fax [phone]  A total of 45 minutes was spent face-to-face with this patient. Over half this time was spent on counseling patient on the dementia diagnosis and different diagnostic and therapeutic options available.

## 2015-11-02 NOTE — Telephone Encounter (Signed)
I spoke to the daughter, Victorino DikeJennifer, in re the CREAD study. The daughter stated that the patient was Dx with Alzheimer's Disease today, [id number], and that "it's too much at the moment with the diagnosis" and thus will not participate at this time. I told her that should the patient change her mind in the future that [facility] can [facility], and we can start with the screening process. Victorino DikeJennifer voiced understand and appreciation, and I thanked her for her time and consideration.

## 2015-11-02 NOTE — Patient Instructions (Signed)
Remember to drink plenty of fluid, eat healthy meals and do not skip any meals. Try to eat protein with a every meal and eat a healthy snack such as fruit or nuts in between meals. Try to keep a regular sleep-wake schedule and try to exercise daily, particularly in the form of walking, 20-30 minutes a day, if you can.   As far as your medications are concerned, I would like to suggest: Aricept start with 1/2 tablet and increase to a whole tablet daily.  I would like to see you back in 4-6 months, sooner if we need to. Please call us with any interim questions, concerns, problems, updates or refill requests.   Our phone number is [phone]. We also have an after hours call service for urgent matters and there is a physician on-call for urgent questions. For any emergencies you know to call 911 or go to the nearest emergency room

## 2015-11-07 ENCOUNTER — Telehealth: Payer: Self-pay | Admitting: Neurology

## 2015-11-07 NOTE — Telephone Encounter (Signed)
Patient's daughter Yvette DikeJennifer is calling and states her mother started taking Aricept last Wednesday 11/02/15 and began throwing up and having diarrhea so she only took the one day.  Daughter is asking if there is another medication that would help these side affects or should her mother take a completely different medication than the Aricept.  Please call.

## 2015-11-07 NOTE — Telephone Encounter (Signed)
Dr Lucia GaskinsAhern- Lorain ChildesFYI Called daughter back. I asked if pt took 5mg  or 10mg . Daughter stated her mother took the whole tablet and did not cut in half. Advised she should try taking 5mg  and see how she tolerates it. She will have mother try this tonight. She knows to call back if she does not tolerate this dose either.

## 2015-11-11 DIAGNOSIS — R52 Pain, unspecified: Secondary | ICD-10-CM | POA: Diagnosis not present

## 2015-11-11 DIAGNOSIS — J0100 Acute maxillary sinusitis, unspecified: Secondary | ICD-10-CM | POA: Diagnosis not present

## 2015-11-11 DIAGNOSIS — Z6827 Body mass index (BMI) 27.0-27.9, adult: Secondary | ICD-10-CM | POA: Diagnosis not present

## 2015-11-11 DIAGNOSIS — R413 Other amnesia: Secondary | ICD-10-CM | POA: Diagnosis not present

## 2015-11-11 DIAGNOSIS — R05 Cough: Secondary | ICD-10-CM | POA: Diagnosis not present

## 2015-11-14 ENCOUNTER — Telehealth: Payer: Self-pay | Admitting: Neurology

## 2015-11-14 DIAGNOSIS — G309 Alzheimer's disease, unspecified: Principal | ICD-10-CM

## 2015-11-14 DIAGNOSIS — F0280 Dementia in other diseases classified elsewhere without behavioral disturbance: Secondary | ICD-10-CM

## 2015-11-14 NOTE — Telephone Encounter (Signed)
Hulda HumphreyEmma,  Emma, That is a great idea. we could place a referral for home health nurse but I am not sure how many sessions they provide paid for from insurance. There is an organization called [facility](www[facility].com) that they can call and hire someone to come and help with [facility] as well. In the community there are centers for seniors where they can go during the day however I am not familiar with [place] where they live so their primary care may be a good source of information for them if their office is in [place]. Www.aplaceformom.com has some good articles as well.   thanks

## 2015-11-14 NOTE — Telephone Encounter (Signed)
Daughter called said pt is forgetting to eat and doesn't want to eat. She is refusing to eat, sts she has ketones. She has lost at least 20 lbs in . Said she can't be with her all the time when her dad is not there. What advise to help her with care.

## 2015-11-14 NOTE — Telephone Encounter (Signed)
Dr Lucia GaskinsAhern- please advise  Called daughter back. Offered idea of bringing in home health care and having a nurse come in to help care for her mother. She likes this idea and thinks this would be the best option. She states her father works all day and is not there to help. She is at home but cannot continue to always be at her mothers. She is not able to get what she needs done and take care of her mother.  Advised I will talk to Dr Lucia GaskinsAhern to also get her suggestion and call her back to advise tomorrow. She verbalized understanding and appreciation.

## 2015-11-15 NOTE — Telephone Encounter (Signed)
Placed referral to [facility] health: [facility] as requested by daughter.

## 2015-11-15 NOTE — Telephone Encounter (Signed)
Called Grand PassJennifer back. Relayed Dr Lucia GaskinsAhern message. She verbalized understanding. She would like referral placed for home health. In the meantime she will look into [facility] and see if this is something she wants to move forward with. Offered f/u on 12/29/15 at 2 or 230pm but she would like something sooner so the husband can come and talk to Dr Lucia GaskinsAhern about everything that is going on. Advised I have to get approved by Dr Lucia GaskinsAhern first. I will call her back to let her know. She verbalized understanding.

## 2015-11-16 NOTE — Telephone Encounter (Signed)
Called and spoke to daughter, Victorino DikeJennifer. Advised [facility]. Also made earlier f/u to 11/22/15 at 4pm. Check in 345pm. Ok per Dr Lucia GaskinsAhern to use 4pm slot. She verbalized understanding and appreciation.

## 2015-11-17 DIAGNOSIS — E784 Other hyperlipidemia: Secondary | ICD-10-CM | POA: Diagnosis not present

## 2015-11-17 DIAGNOSIS — I10 Essential (primary) hypertension: Secondary | ICD-10-CM | POA: Diagnosis not present

## 2015-11-17 DIAGNOSIS — N390 Urinary tract infection, site not specified: Secondary | ICD-10-CM | POA: Diagnosis not present

## 2015-11-17 DIAGNOSIS — R634 Abnormal weight loss: Secondary | ICD-10-CM | POA: Diagnosis not present

## 2015-11-17 DIAGNOSIS — R8299 Other abnormal findings in urine: Secondary | ICD-10-CM | POA: Diagnosis not present

## 2015-11-17 DIAGNOSIS — R7989 Other specified abnormal findings of blood chemistry: Secondary | ICD-10-CM | POA: Diagnosis not present

## 2015-11-17 DIAGNOSIS — R5383 Other fatigue: Secondary | ICD-10-CM | POA: Diagnosis not present

## 2015-11-22 ENCOUNTER — Ambulatory Visit (INDEPENDENT_AMBULATORY_CARE_PROVIDER_SITE_OTHER): Payer: Medicare Other | Admitting: Neurology

## 2015-11-22 ENCOUNTER — Encounter: Payer: Self-pay | Admitting: Neurology

## 2015-11-22 VITALS — BP 116/72 | HR 76 | Ht 65.0 in | Wt 159.4 lb

## 2015-11-22 DIAGNOSIS — R7989 Other specified abnormal findings of blood chemistry: Secondary | ICD-10-CM | POA: Diagnosis not present

## 2015-11-22 DIAGNOSIS — R634 Abnormal weight loss: Secondary | ICD-10-CM | POA: Diagnosis not present

## 2015-11-22 DIAGNOSIS — R413 Other amnesia: Secondary | ICD-10-CM | POA: Diagnosis not present

## 2015-11-22 DIAGNOSIS — R5383 Other fatigue: Secondary | ICD-10-CM | POA: Diagnosis not present

## 2015-11-22 DIAGNOSIS — E781 Pure hyperglyceridemia: Secondary | ICD-10-CM | POA: Diagnosis not present

## 2015-11-22 DIAGNOSIS — F99 Mental disorder, not otherwise specified: Secondary | ICD-10-CM | POA: Diagnosis not present

## 2015-11-22 DIAGNOSIS — F0150 Vascular dementia without behavioral disturbance: Secondary | ICD-10-CM

## 2015-11-22 DIAGNOSIS — F0280 Dementia in other diseases classified elsewhere without behavioral disturbance: Secondary | ICD-10-CM | POA: Diagnosis not present

## 2015-11-22 DIAGNOSIS — Z6825 Body mass index (BMI) 25.0-25.9, adult: Secondary | ICD-10-CM | POA: Diagnosis not present

## 2015-11-22 DIAGNOSIS — N390 Urinary tract infection, site not specified: Secondary | ICD-10-CM | POA: Diagnosis not present

## 2015-11-22 DIAGNOSIS — F0390 Unspecified dementia without behavioral disturbance: Secondary | ICD-10-CM

## 2015-11-22 NOTE — Patient Instructions (Signed)
Remember to drink plenty of fluid, eat healthy meals and do not skip any meals. Try to eat protein with a every meal and eat a healthy snack such as fruit or nuts in between meals. Try to keep a regular sleep-wake schedule and try to exercise daily, particularly in the form of walking, 20-30 minutes a day, if you can.   As far as diagnostic testing: PET scan(FDG PET scan) and repeat Neuropsych testing after medical   Our phone number is [phone]. We also have an after hours call service for urgent matters and there is a physician on-call for urgent questions. For any emergencies you know to call 911 or go to the nearest emergency room

## 2015-11-22 NOTE — Progress Notes (Signed)
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 Patient has recently discovered to have possible hepatitis. They are wondering if the report was in error due to medical illness or hepatic encephalopathy. May repeat neurocognitive testing with dr zelson at a later time. Also discussed PET scan. Patient is here with husband and daughter. Family does feel that patient may have memory changes however its unusual that she is so functional given the recent Alzheimer's dementia diagnosis. In fact patient still performs IADLs including paying all the bills and she does so very successfully. Reviewed the neurocognitive testing report in detail with family and answered questions, discussed pertinent findings, patient refused to meet with the physician who performed the test, unknown why, and I highly encouraged them to go back and meet with physician who analyzed the data for her neurocognitive testing. Provided literature on Alzheimer's disease so they could ask educated questions. We'll also order FDG PET scanning. Discussed this in detail, sensitivity and specificity, what a positive and negative result may entail.  From the neurocognitive report:  Her scores with predominantly memory disruption and executive dysfunction, as well as the pattern of her language scores, with intact letter fluency but impairment in confrontation naming and semantic fluency, is suggestive of temporal lobe dysfunction as would be seen in Alzheimer's disease. The reported course of her symptoms is consistent with what is expected in Alzheimer's disease. It is also unexpected that the level of cognitive disruption that she is fully managing her ADLs such as finances without trouble. At this time there is no evidence of potential new disruption that  would adversely impact her cognitive functioning. Patient should attempt to minimize distractions in her environment. She should not be driving.  Interval history 06/27/2015: Here for follow up on memory loss. EEG showed diffuse background theta frequency slowing. Lab testing was unremarkable including rpr, hiv, ammonia, b12, tsh. They followed up with primary care and pcp mentioned vascular dementia was discussed however I stated MRi of the brain was normal for age and MRA of the brain was unremarkable, no significant white matter changes or vascular processes were seen. Will send for neuropsychological testing. She has been under a lot of stress with her husband, husband is an alcoholic. His drinking has worsened. Patient's MMSE was 24/30 at last appointment. Will perform a MoCA today. She has had some vaginal discomfort but no UTI as far as they know. Her cholesterol was checked. They did bloodwork. No other metabolic/toxic causes of memory loss or eeg slowing was found. BP is 165/90 with P66, advised to call primary care and consider adding medication for her elevated BP. She snores, she is excessively tired during the day, she needs to nap. No dementia in the family, mother is 57 and father died in 20s.   HPI: [name] is a 65 y.o. female here as a referral from Dr. Catha Sims for a TIA. She is here with daughter. Daughter said she staggered, head was hanging down, said she was really dizzy, slurring of speech, reported room was spinning. Patient was in her closet trying to figure out what to wear. Patient doesn't remember any of it. No previous incidents of this type. Daughter said she will go to the grocery store and not get anything. She will forget how to set the alarm. Since this incident, patient is more confused.  She has not been to a doctor in years previous to hospitalization. Patient denies any new medications. Two weeks before this happened she had surgery on her hand for Dupuytren's  contracture. She gets dizzy and confused in the grocery store. Patient doesn't remember a lot of these things. More short-term memory loss. Things that happened a long time she remembers. They believe patient's mother may have Alzheimers. No depression or mood disorder. No new medications. Memory problems started 6-12 months ago and getting worse. Aspirin had been held for surgery. Started statin in the hospital. Denies headache, any current neurologic symptoms, CP, SOB, visual changes. She is having episodic vertigo and memory changes as above.   Reviewed notes, labs and imaging from outside physicians, which showed:  Per neurology notes inpatient: [name] is a 65 y.o. female who believed to be healthy but has not had any regular medical follow-up for several years. She had surgery on her right hand approximately one week ago performed by Dr. Amanda Sims for a Dupuytren's contracture. The patient's daughter drove over to her mother's house to pick up something. Her mother walked out of the house towards the car and her daughter noted that her mother appeared to be staggering. She helped her mother back in to the house, sat her down, and called 911. The daughter noticed some mild dysarthria and said that this was also noted by the EMTs.She also felt that her mother was mildly confused. The patient felt as if the room was spinning. The patient herself did not remember much about the incident. She remembers working in the house and getting ready to go out to the car; however, she is foggy about the details. The patient's speech was back to normal in the emergency department, although she still seemed to have some mild cognitive difficulties. When her daughter tried to help her mother get up and go to the bathroom in the ER the patient was once again dizzy. She does not feel dizzy lying down. The patient's daughter estimates that the worst of these symptoms lasted approximately 2 hours. The patient denied any  recent headaches, palpitations, nausea or vomiting, visual changes, speech difficulties, changes in hearing, abnormal sounds or ringing in her ears.  The patient's husband and daughter both report that the patient's memory has been getting progressively worse over the past 6 months to one year. She had been taking aspirin 81 mg daily but this was recently held for her hand surgery.  MRI HEAD FINDINGS personally reviewed images and agree with the following.   The ventricles and sulci are normal for patient's age. No abnormal parenchymal signal, mass lesions, mass effect. No reduced diffusion to suggest acute ischemia. No susceptibility artifact to suggest hemorrhage.  No abnormal extra-axial fluid collections. No extra-axial masses though, contrast enhanced sequences would be more sensitive. Normal major intracranial vascular flow voids seen at the skull base.  Ocular globes and orbital contents are unremarkable though not tailored for evaluation. No abnormal sellar expansion. Visualized paranasal sinuses and mastoid air cells are well-aerated, LEFT concha bullosa. No suspicious calvarial bone marrow signal. No abnormal sellar expansion. Craniocervical junction maintained.  MRA HEAD FINDINGS  Anterior circulation: Normal flow related enhancement of the included cervical, petrous, cavernous and supra clinoid internal carotid arteries. Very mild luminal irregularity LEFT cavernous carotid artery corresponding to calcific atherosclerosis on prior head CT. Patent anterior communicating artery. Normal flow related enhancement of the anterior and middle cerebral arteries, including more distal segments.  No large vessel occlusion,  high-grade stenosis, abnormal luminal irregularity, aneurysm.  Posterior circulation: LEFT vertebral artery is dominant. Basilar artery is patent, with normal flow related enhancement of the main branch vessels. Fetal origin RIGHT posterior cerebral artery.  Small LEFT posterior communicating artery present. Normal flow related enhancement of the posterior cerebral arteries.  No large vessel occlusion, high-grade stenosis, abnormal luminal irregularity, aneurysm.  IMPRESSION: MRI HEAD: No acute intracranial process; normal noncontrast MRI of the brain for age.  MRA HEAD: No acute large vessel occlusion or high-grade stenosis ; complete circle of Willis.  hgba1c 6 LDL unable to calculate given such high triglycerides B12 349 TSH 2.5 CBC,CMP unremarkable  Study Conclusions  - Left ventricle: The cavity size was normal. There was moderate concentric hypertrophy. Systolic function was normal. The estimated ejection fraction was in the range of 55% to 60%. Wall motion was normal; there were no regional wall motion abnormalities.  Impressions:  - No cardiac source of emboli was indentified.  Review of Systems: Patient complains of symptoms per HPI as well as the following symptoms: weight loss, CP, snoring, rash, confusion, dizziness. Pertinent negatives per HPI. All others negative.    Social History   Social History  . Marital Status: Married    Spouse Name: N/A  . Number of Children: 2  . Years of Education: 12   Occupational History  . Not on file.   Social History Main Topics  . Smoking status: Former Smoker    Quit date: 08/20/2004  . Smokeless tobacco: Not on file  . Alcohol Use: 0.0 oz/week    0 Standard drinks or equivalent per week  . Drug Use: No  . Sexual Activity: Yes   Other Topics Concern  . Not on file   Social History Narrative   Lives at home with husband.    Drinks coffee daily     Family History  Problem Relation Age of Onset  . Cancer Father     Past Medical History  Diagnosis Date  . Psoriasis   . High cholesterol   . Eczema   . High cholesterol     Past Surgical History  Procedure Laterality Date  . Tonsillectomy    . Hand surgery      Carpal tunnel  . Shoulder  surgery    . Knee surgery      Current Outpatient Prescriptions  Medication Sig Dispense Refill  . aspirin EC 81 MG tablet Take 1 tablet (81 mg total) by mouth daily. 30 tablet 6  . clobetasol cream (TEMOVATE) 0.05 % Apply 1 application topically as needed.  3  . losartan (COZAAR) 50 MG tablet Take 50 mg by mouth daily.  5   No current facility-administered medications for this visit.    Allergies as of 11/22/2015 - Review Complete 11/22/2015  Allergen Reaction Noted  . Atorvastatin Other (See Comments) 11/22/2015  . Ibuprofen  03/28/2013  . Nsaids Other (See Comments) 11/22/2015    Vitals: BP 116/72 mmHg  Pulse 76  Ht 5\' 5"  (1.651 m)  Wt 159 lb 6.4 oz (72.303 kg)  BMI 26.53 kg/m2 Last Weight:  Wt Readings from Last 1 Encounters:  11/22/15 159 lb 6.4 oz (72.303 kg)   Last Height:   Ht Readings from Last 1 Encounters:  11/22/15 5\' 5"  (1.651 m)   MMSE - Mini Mental State Exam 06/27/2015 05/02/2015  Orientation to time 4 3  Orientation to Place 5 4  Registration 3 3  Attention/ Calculation 4 5  Recall 0 0  Language- name 2 objects 2 2  Language- repeat 1 1  Language- follow 3 step command 3 3  Language- read & follow direction 1 1  Write a sentence 1 1  Copy design 1 1  Total score 25 24      Cranial Nerves:  The pupils are equal, round, and reactive to light. The fundi are flat. Visual fields are full to finger confrontation. Extraocular movements are intact. Trigeminal sensation is intact and the muscles of mastication are normal. The face is symmetric. The palate elevates in the midline. Hearing intact. Voice is normal. Shoulder shrug is normal. The tongue has normal motion without fasciculations.   Coordination:  Normal finger to nose and heel to shin. Normal rapid alternating movements.   Gait:  Heel-toe and tandem gait are normal.   Motor Observation:  No asymmetry, no atrophy, and no involuntary movements noted. Tone:  Normal muscle tone.    Posture:  Posture is normal. normal erect   Strength:  Strength is V/V in the upper and lower limbs.    Sensation: intact to LT   Reflex Exam:  DTR's:  Deep tendon reflexes in the upper and lower extremities are normal bilaterally.  Toes:  The toes are downgoing bilaterally.  Clonus:  Clonus is absent.    PET scan.  ASSESSMENT/PLAN Ms. BRYANT LIPPS is a 64 y.o. female with history of his tobacco history, psoriasis, and recent right hand surgery presented with transient staggering gait, dizziness, dysarthria, and mild confusion with progressive memory issues over the past 6-12 months to the ED. Imaging was negative and patient was diagnosed with possible TIA Her biggest concern is the memory issues over the last 6-12 months which may be complicated by depression due to her husband's alcoholism.MoCA was 24 out of 30 and patient denies any memory loss. She thinks she is just under a lot of stress right now. She is here with her daughter in the office who may also think mother is just under a significant amount of stress. However patient is very concerned about dementia.  Had a long discussion today with patient and husband and daughter. It is surprising to me that patient was diagnosed with Alzheimer's considering that she reportedly performs IADLs such as paying all the bills and does it all very successfully. Even though patient denies it, I wonder depression is a factor. There is also a question of hepatitis and she is being worked up. Hepatic encephalopathy could cause a skewing of the report. Patient declined meeting with the physician who provided neurocognitive testing, I highly encouraged family to go back and meet with physician. Provided them with Alzheimer's dementia documentation so they can ask informed questions. We'll also order an FDG PET scan which is very sensitive and specific for Alzheimer's dementia.  Neurocognitive testing c/w Major  Neurocognitive disorder likely Alzheimer's dementia, given the magnitude of breath of cognitive impairment it is surprising MRI of her brain was unremarkable.. In discussion with patient, her son and daughterat last appointment. Patient was very upset as understandable. Discussed Alzheimer's and the natural progression. We'll start Aricept. Namenda may also be considered given the magnitude of cognitive deficits displayed.  Given her market visual spatial and executive disruption it is recommended she refrain from driving Her neurocognitive report. Unless she passed formal driving evaluation. Provided a referral for patient. If she should not drive until she has is completed.  MRI and MRA showed no acute processes. MRI of the brain was essentially normal.no significant white  matter changes or vascular processes were seen. TSH and B12 are normal. No toxic/metabolic anomalies.  Neeeds to follow closely with primary care for management of vascular risk factors. Continue aspirin daily for stroke prevention.. She snores significantly and is excessively tired, will refer to sleep studies. Yvette Dean, MD  [facility] [address] [place], [place] [address]  Phone [phone] Fax [phone]  A total of 45  minutes was spent face-to-face with this patient. Over half this time was spent on counseling patient on the Maj. neurocognitive disorder  diagnosis and different diagnostic and therapeutic options available.

## 2015-11-23 ENCOUNTER — Other Ambulatory Visit: Payer: Self-pay | Admitting: Neurology

## 2015-11-23 ENCOUNTER — Telehealth: Payer: Self-pay | Admitting: Neurology

## 2015-11-23 DIAGNOSIS — F0280 Dementia in other diseases classified elsewhere without behavioral disturbance: Secondary | ICD-10-CM

## 2015-11-23 DIAGNOSIS — G300 Alzheimer's disease with early onset: Principal | ICD-10-CM

## 2015-11-23 DIAGNOSIS — K8020 Calculus of gallbladder without cholecystitis without obstruction: Secondary | ICD-10-CM | POA: Diagnosis not present

## 2015-11-23 NOTE — Telephone Encounter (Signed)
Dr Lucia GaskinsAhern- can you place order? Thank you  Called and spoke to AnchorJennifer. She would like to proceed with FDG pet scan as discussed with Dr Lucia GaskinsAhern. Advised they should hear within the next week to get that scheduled. She verbalized understanding.

## 2015-11-23 NOTE — Telephone Encounter (Signed)
I have ordered an FDG nuclear medicine PET scan to evaluate for Alzheimer's dementia. Can you help get this approved please?

## 2015-11-23 NOTE — Telephone Encounter (Signed)
Dr Ahern- FYI 

## 2015-11-23 NOTE — Telephone Encounter (Signed)
Would they like to proceed now with the FDG pet scan as discussed? We would like to make sure she has no medical issues before going so nothing interferes with the results. thanks

## 2015-11-23 NOTE — Telephone Encounter (Signed)
Daughter [name] called to advise Kara Meadmma that she has the results, hepatitis is negative, abdominal ultrasound was normal.

## 2015-11-24 NOTE — Telephone Encounter (Signed)
Thank you ! Would you call them again in a few days and make sure they got it? THANK YOU !!!!

## 2015-11-24 NOTE — Telephone Encounter (Addendum)
[name] and Dr. Lucia GaskinsAhern. FDG PET Scan is approved. [facility] . 12/06/2015. Arrive at 6:30 for  7:00 apt. Patient need's to be  NPO six hours before appointment . If this date does not work please call  scheduling at [phone] for a date that's good for the family.  Approval # [id number] 11/24/2015-12/23/2015.  Called patients' home and left message and called patient's daughter and left message.

## 2015-11-24 NOTE — Telephone Encounter (Signed)
Hi Dr. Lucia GaskinsAhern , called to get  FDG pet scan approved  per insurance company need's  peer to peer . Ask for approval number.  Call [phone] then push option 1 to speak to a physician.   Procedure code for PET scan [id number] . Patient's ID [id number]. Dr. Lucia GaskinsAhern please let me know when you are done so I can call [facility] and patient to get set up.

## 2015-11-27 DIAGNOSIS — F0390 Unspecified dementia without behavioral disturbance: Secondary | ICD-10-CM | POA: Insufficient documentation

## 2015-11-27 DIAGNOSIS — F0150 Vascular dementia without behavioral disturbance: Secondary | ICD-10-CM | POA: Insufficient documentation

## 2015-11-28 ENCOUNTER — Encounter: Payer: Self-pay | Admitting: Neurology

## 2015-11-28 NOTE — Telephone Encounter (Signed)
Called and left message for daughter and husband asking them to call me back to make sure they have details of apt.

## 2015-11-29 ENCOUNTER — Telehealth: Payer: Self-pay | Admitting: *Deleted

## 2015-11-29 NOTE — Telephone Encounter (Signed)
Called and spoke to daughter Victorino DikeJennifer. Advised letter ready from Dr Lucia GaskinsAhern. She would like it mailed [address] [place], [place] [id number]. Advised I will place in mail today. She verbalized understanding.

## 2015-11-29 NOTE — Telephone Encounter (Signed)
Called daughter and husband x2 left message about PET scan with details of apt. For patient. No returned phone calls.

## 2015-12-06 ENCOUNTER — Telehealth: Payer: Self-pay | Admitting: Neurology

## 2015-12-06 ENCOUNTER — Encounter (HOSPITAL_COMMUNITY)
Admission: RE | Admit: 2015-12-06 | Discharge: 2015-12-06 | Disposition: A | Discharge status: Home / Self Care | Payer: Medicare Other | Source: Ambulatory Visit | Attending: Neurology | Admitting: Neurology

## 2015-12-06 DIAGNOSIS — F0280 Dementia in other diseases classified elsewhere without behavioral disturbance: Secondary | ICD-10-CM | POA: Diagnosis not present

## 2015-12-06 DIAGNOSIS — G300 Alzheimer's disease with early onset: Secondary | ICD-10-CM | POA: Diagnosis not present

## 2015-12-06 DIAGNOSIS — R4182 Altered mental status, unspecified: Secondary | ICD-10-CM | POA: Diagnosis not present

## 2015-12-06 MED ORDER — FLUDEOXYGLUCOSE F - 18 (FDG) INJECTION
9.8100 | Freq: Once | INTRAVENOUS | Status: AC | PRN
  Administered 2015-12-06: 9.81 via INTRAVENOUS

## 2015-12-06 NOTE — Telephone Encounter (Signed)
Daughter Victorino DikeJennifer returned Dr. Trevor MaceAhern's call. Please call [phone].

## 2015-12-06 NOTE — Telephone Encounter (Signed)
Spoke to daughter Victorino DikeJennifer. Discussed the below. Left message for husband [phone]

## 2015-12-06 NOTE — Telephone Encounter (Signed)
Dr Lucia GaskinsAhern- daughter called office back to discuss results. Please call back,thank you

## 2015-12-06 NOTE — Telephone Encounter (Signed)
Called daughter, Victorino Dikejennifer, on dpr to discuss. Left message. Asked to call back.   FINDINGS: Relative decreased cortical metabolic activity within the posterior lateral LEFT parietal lobe (image 32 of the PET data set). Smaller focus of decreased cortical metabolism in the RIGHT parietal lobe at a similar level image 34. There is prominent cortical sulcation / atrophy through these regions but the decrease in metabolic activity is felt to relate to decrease cortical activity rather than increased CSF space.  There is subtle decreased in bilateral temporal cortical metabolic activity additionally.  There is normal occipital metabolic cortical activity. Normal frontal lobe cortical activity.  No significant CT findings of the atrophy.  IMPRESSION: Biparietal decreased cortical metabolism greater on the LEFT coupled with more subtle decreased bitemporal cortical metabolism would be consistent with an Alzheimer's pathology pattern.

## 2015-12-07 ENCOUNTER — Telehealth: Payer: Self-pay | Admitting: Neurology

## 2015-12-07 NOTE — Telephone Encounter (Signed)
Pt called to speak Dr. Lucia GaskinsAhern. Pt called about a form, paper work, that was filled out by Dr. Wylene SimmerPollard. Pt says that she does not agree what was put down and she is requesting a call from Dr. Lucia GaskinsAhern to discuss. Please call [phone]

## 2015-12-07 NOTE — Telephone Encounter (Signed)
Spoke to daughter. They have to talk to Dr. Wylene SimmerPollard about this. I informed her about the call. Daughter thinks patient is having a difficult time with the diagnosis which I understand but we need to redirect her as Dr. Wylene SimmerPollard has done nothing wrong. I spoke to Ms. Peak as well. She was upset and discussed the grammatical errors and other errors. "It irritates the snot out of me".  I explained that Dr. Wylene SimmerPollard is great, we all make mistakes but the substance of the report was accurate and we very much appreciate Dr. Georjean ModePollard's work. She can call Dr. Georjean ModePollard's office if she likes but I recommend sitting back and taking a breath. Daughter warned me that her mom may "start on me" as well at some point.  thanks

## 2015-12-07 NOTE — Telephone Encounter (Signed)
Dr [name]- pt requesting a call from you. Please call, thank you  Called pt back. Asked if there was anything I could do for her. Dr [name] is currently seeing pt. She stated "My name is listed several times. About 8 times my name is wrong. I am not paying for this garbage". She also stated within the documents it states she is right handed and this is incorrect. She said "I have never picked up a pencil in my life. I never told them this information.". She stated this was the documents from Dr Wylene SimmerPollard. She went and saw PCP office today.  She states she is having to pay about 60 dollars for paperwork and she will not pay for something like this. She would like to talk to Dr [name] about this. Advised I will send message to Dr [name] and we will call back latest tomorrow. She verbalized understanding.

## 2015-12-08 ENCOUNTER — Telehealth: Payer: Self-pay | Admitting: Neurology

## 2015-12-08 NOTE — Telephone Encounter (Signed)
Mailed completed Disability Parking Placard application to patient's daughter Percell LocusJennifer Lingerfelt.

## 2015-12-11 ENCOUNTER — Encounter (HOSPITAL_BASED_OUTPATIENT_CLINIC_OR_DEPARTMENT_OTHER): Payer: Self-pay | Admitting: *Deleted

## 2015-12-11 ENCOUNTER — Emergency Department (HOSPITAL_BASED_OUTPATIENT_CLINIC_OR_DEPARTMENT_OTHER)
Admission: EM | Admit: 2015-12-11 | Discharge: 2015-12-11 | Disposition: A | Discharge status: Home / Self Care | Payer: Medicare Other | Attending: Emergency Medicine | Admitting: Emergency Medicine

## 2015-12-11 DIAGNOSIS — Z8639 Personal history of other endocrine, nutritional and metabolic disease: Secondary | ICD-10-CM | POA: Insufficient documentation

## 2015-12-11 DIAGNOSIS — M542 Cervicalgia: Secondary | ICD-10-CM | POA: Diagnosis present

## 2015-12-11 DIAGNOSIS — L089 Local infection of the skin and subcutaneous tissue, unspecified: Secondary | ICD-10-CM | POA: Diagnosis not present

## 2015-12-11 DIAGNOSIS — Z7982 Long term (current) use of aspirin: Secondary | ICD-10-CM | POA: Diagnosis not present

## 2015-12-11 DIAGNOSIS — Z87891 Personal history of nicotine dependence: Secondary | ICD-10-CM | POA: Insufficient documentation

## 2015-12-11 DIAGNOSIS — Z79899 Other long term (current) drug therapy: Secondary | ICD-10-CM | POA: Insufficient documentation

## 2015-12-11 MED ORDER — AMOXICILLIN-POT CLAVULANATE 500-125 MG PO TABS: 1.0000 | ORAL_TABLET | Freq: Three times a day (TID) | ORAL | Status: DC

## 2015-12-11 NOTE — ED Provider Notes (Signed)
CSN: [id number]     Arrival date & time 12/11/15  1811 History  By signing my name below, I, Budd PalmerVanessa Prueter, attest that this documentation has been prepared under the direction and in the presence of Nelva Nayobert Lilianah Buffin, MD. Electronically Signed: Budd PalmerVanessa Prueter, ED Scribe. 12/11/2015. 6:41 PM.    Chief Complaint  Patient presents with  . Neck Pain   The history is provided by the patient and the spouse. No language interpreter was used.   HPI Comments: [name] is a 65 y.o. female former smoker with a PMHx of eczema, psoriasis, and high cholesterol who presents to the Emergency Department complaining of left-sided upper neck pain onset yesterday. She reports associated swelling and tenderness to the area. She notes it feels as though the swelling and tenderness has now moved lower along her neck. She denies any recent injuries. Per husband, pt is in the early stages of Alzheimer's disease. He notes pt is scheduled to have hand surgery by Dr Amanda PeaGramig to relocate her right ulnar nerve in 2 days. He notes pt's PCP is Dr Wylene Simmerisovec. Pt denies fever. Pt is allergic to ibuprofen, NSAID's, and atorvastatin.   Past Medical History  Diagnosis Date  . Psoriasis   . High cholesterol   . Eczema   . High cholesterol    Past Surgical History  Procedure Laterality Date  . Tonsillectomy    . Hand surgery      Carpal tunnel  . Shoulder surgery    . Knee surgery     Family History  Problem Relation Age of Onset  . Cancer Father    Social History  Substance Use Topics  . Smoking status: Former Smoker    Quit date: 08/20/2004  . Smokeless tobacco: None  . Alcohol Use: No   OB History    No data available     Review of Systems  Constitutional: Negative for fever.  Musculoskeletal: Positive for neck pain.  All other systems reviewed and are negative.   Allergies  Atorvastatin; Ibuprofen; and Nsaids  Home Medications   Prior to Admission medications   Medication Sig Start Date End Date  Taking? Authorizing Provider  amoxicillin-clavulanate (AUGMENTIN) 500-125 MG tablet Take 1 tablet (500 mg total) by mouth every 8 (eight) hours. 12/11/15   Nelva Nayobert Nikkia Devoss, MD  aspirin EC 81 MG tablet Take 1 tablet (81 mg total) by mouth daily. 05/02/15   Anson FretAntonia B Ahern, MD  clobetasol cream (TEMOVATE) 0.05 % Apply 1 application topically as needed. 03/11/15   Historical Provider, MD  losartan (COZAAR) 50 MG tablet Take 50 mg by mouth daily. 09/30/15   Historical Provider, MD   BP 156/85 mmHg  Pulse 76  Temp(Src) 98.6 F (37 C) (Oral)  Resp 18  Ht 5\' 7"  (1.702 m)  Wt 165 lb (74.844 kg)  BMI 25.84 kg/m2  SpO2 100% Physical Exam  Constitutional: She is oriented to person, place, and time. She appears well-developed and well-nourished. No distress.  HENT:  Head: Normocephalic and atraumatic.  Eyes: Pupils are equal, round, and reactive to light.  Neck: Normal range of motion.    Cardiovascular: Normal rate and intact distal pulses.   Pulmonary/Chest: No respiratory distress.  Abdominal: Normal appearance. She exhibits no distension.  Musculoskeletal: Normal range of motion.  Neurological: She is alert and oriented to person, place, and time. No cranial nerve deficit.  Skin: Skin is warm and dry. No rash noted.  Psychiatric: She has a normal mood and affect. Her behavior is  normal.  Nursing note and vitals reviewed.   ED Course  Procedures  DIAGNOSTIC STUDIES: Oxygen Saturation is 100% on RA, normal by my interpretation.    COORDINATION OF CARE: 6:40 PM - Discussed probable infection and plans to order augmentin. Advised to apply warm compresses at least 3-4 x daily, 20 minutes at a time. Pt advised of plan for treatment and pt agrees.    MDM   Final diagnoses:  Neck infection   I personally performed the services described in this documentation, which was scribed in my presence. The recorded information has been reviewed and considered.   Nelva Nay, MD 12/11/15 1850

## 2015-12-11 NOTE — ED Notes (Addendum)
Patient states she was working in the yard and lat that afternoon she noticed the left side of her upper  neck swelling & it was very tender. She states that her neck is still tender and she feels the the  swelling/tenderness has moved lower in her neck. No injury, no fever. Per husband she is in early stages of alzheimer.

## 2015-12-11 NOTE — Discharge Instructions (Signed)
Wound Infection °A wound infection happens when a type of germ (bacteria) starts growing in the wound. In some cases, this can cause the wound to break open. If cared for properly, the infected wound will heal from the inside to the outside. Wound infections need treatment. °CAUSES °An infection is caused by bacteria growing in the wound.  °SYMPTOMS  °· Increase in redness, swelling, or pain at the wound site. °· Increase in drainage at the wound site. °· Wound or bandage (dressing) starts to smell bad. °· Fever. °· Feeling tired or fatigued. °· Pus draining from the wound. °TREATMENT  °Your health care provider will prescribe antibiotic medicine. The wound infection should improve within 24 to 48 hours. Any redness around the wound should stop spreading and the wound should be less painful.  °HOME CARE INSTRUCTIONS  °· Only take over-the-counter or prescription medicines for pain, discomfort, or fever as directed by your health care provider. °· Take your antibiotics as directed. Finish them even if you start to feel better. °· Gently wash the area with mild soap and water 2 times a day, or as directed. Rinse off the soap. Pat the area dry with a clean towel. Do not rub the wound. This may cause bleeding. °· Follow your health care provider's instructions for how often you need to change the dressing. °· Apply ointment and a dressing to the wound as directed. °· If the dressing sticks, moisten it with soapy water and gently remove it. °· Change the bandage right away if it becomes wet, dirty, or develops a bad smell. °· Take showers. Do not take tub baths, swim, or do anything that may soak the wound until it is healed. °· Avoid exercises that make you sweat heavily. °· Use anti-itch medicine as directed by your health care provider. The wound may itch when it is healing. Do not pick or scratch at the wound. °· Follow up with your health care provider to get your wound rechecked as directed. °SEEK MEDICAL CARE  IF: °· You have an increase in swelling, pain, or redness around the wound. °· You have an increase in the amount of pus coming from the wound. °· There is a bad smell coming from the wound. °· More of the wound breaks open. °· You have a fever. °MAKE SURE YOU:  °· Understand these instructions. °· Will watch your condition. °· Will get help right away if you are not doing well or get worse. °  °This information is not intended to replace advice given to you by your health care provider. Make sure you discuss any questions you have with your health care provider. °  °Document Released: 05/05/2003 Document Revised: 08/11/2013 Document Reviewed: 01/24/2015 °Elsevier Interactive Patient Education ©2016 [employer] ° °

## 2015-12-11 NOTE — ED Notes (Signed)
Pt given d/c instructions as per chart. Verbalizes understanding. No questions. Rx x 1 

## 2015-12-13 ENCOUNTER — Telehealth: Payer: Self-pay | Admitting: Neurology

## 2015-12-13 DIAGNOSIS — G8918 Other acute postprocedural pain: Secondary | ICD-10-CM | POA: Diagnosis not present

## 2015-12-13 DIAGNOSIS — G5601 Carpal tunnel syndrome, right upper limb: Secondary | ICD-10-CM | POA: Diagnosis not present

## 2015-12-13 DIAGNOSIS — G5621 Lesion of ulnar nerve, right upper limb: Secondary | ICD-10-CM | POA: Diagnosis not present

## 2015-12-13 NOTE — Telephone Encounter (Addendum)
Patient's husband is calling and states FMLA paperwork will be dropped off today to be filled out for his wife.  He has paid $25 charge and will pick up when ready.

## 2015-12-14 DIAGNOSIS — Z0289 Encounter for other administrative examinations: Secondary | ICD-10-CM

## 2015-12-14 NOTE — Telephone Encounter (Signed)
Received paperwork. Awaiting for Dr Lucia GaskinsAhern to come back next week to see if okay to fill out.

## 2015-12-19 ENCOUNTER — Telehealth: Payer: Self-pay | Admitting: *Deleted

## 2015-12-19 NOTE — Telephone Encounter (Signed)
Dr Lucia GaskinsAhern- please advise  Called Diona Brownerom Savastano, husband (listed on DPR). He dropped off FMLA paperwork in office last week. Advised him Dr Lucia GaskinsAhern has 14 days to complete this type of paperwork. He has already paid fee for paperwork.  He would like to pick up in the office once paperwork is completed. He stated this paperwork would be for him to take time off of work to help bring her to appt and help her in general. He states he can get a consecutive 12 weeks off work or take 12 weeks intermittently throughout the year from work. Advised I will speak to Dr Lucia GaskinsAhern about this and we will call him back if we have further questions. He verbalized understanding.

## 2015-12-19 NOTE — Telephone Encounter (Signed)
That's fine, i will fill out his FMLA. Just ask him how he wants it distributed. I can give him up to 2 days or 16 hours a month for appointments, that should be plenty thanks.

## 2015-12-20 DIAGNOSIS — R7989 Other specified abnormal findings of blood chemistry: Secondary | ICD-10-CM | POA: Diagnosis not present

## 2015-12-20 DIAGNOSIS — E784 Other hyperlipidemia: Secondary | ICD-10-CM | POA: Diagnosis not present

## 2015-12-20 DIAGNOSIS — Z6824 Body mass index (BMI) 24.0-24.9, adult: Secondary | ICD-10-CM | POA: Diagnosis not present

## 2015-12-20 DIAGNOSIS — I679 Cerebrovascular disease, unspecified: Secondary | ICD-10-CM | POA: Diagnosis not present

## 2015-12-20 DIAGNOSIS — G459 Transient cerebral ischemic attack, unspecified: Secondary | ICD-10-CM | POA: Diagnosis not present

## 2015-12-20 DIAGNOSIS — E781 Pure hyperglyceridemia: Secondary | ICD-10-CM | POA: Diagnosis not present

## 2015-12-20 DIAGNOSIS — G309 Alzheimer's disease, unspecified: Secondary | ICD-10-CM | POA: Diagnosis not present

## 2015-12-20 DIAGNOSIS — I10 Essential (primary) hypertension: Secondary | ICD-10-CM | POA: Diagnosis not present

## 2015-12-20 DIAGNOSIS — I6529 Occlusion and stenosis of unspecified carotid artery: Secondary | ICD-10-CM | POA: Diagnosis not present

## 2015-12-20 NOTE — Telephone Encounter (Signed)
Tried calling [name] back. LVM for him to call. Gave [facility] phone number.

## 2015-12-23 NOTE — Telephone Encounter (Deleted)
Gave completed botox form to Danielle 

## 2015-12-26 NOTE — Telephone Encounter (Signed)
Sent completed/signed FMLA paperwork by Dr Lucia GaskinsAhern back to medical records.

## 2015-12-28 ENCOUNTER — Telehealth: Payer: Self-pay | Admitting: *Deleted

## 2015-12-28 DIAGNOSIS — Z4789 Encounter for other orthopedic aftercare: Secondary | ICD-10-CM | POA: Diagnosis not present

## 2015-12-28 DIAGNOSIS — M79641 Pain in right hand: Secondary | ICD-10-CM | POA: Diagnosis not present

## 2015-12-28 NOTE — Telephone Encounter (Signed)
Left message for pt to return my call.

## 2016-01-05 DIAGNOSIS — M79641 Pain in right hand: Secondary | ICD-10-CM | POA: Diagnosis not present

## 2016-01-09 DIAGNOSIS — Z4789 Encounter for other orthopedic aftercare: Secondary | ICD-10-CM | POA: Diagnosis not present

## 2016-01-09 DIAGNOSIS — M23342 Other meniscus derangements, anterior horn of lateral meniscus, left knee: Secondary | ICD-10-CM | POA: Diagnosis not present

## 2016-01-11 DIAGNOSIS — M79641 Pain in right hand: Secondary | ICD-10-CM | POA: Diagnosis not present

## 2016-01-25 DIAGNOSIS — M79641 Pain in right hand: Secondary | ICD-10-CM | POA: Diagnosis not present

## 2016-02-07 DIAGNOSIS — M79641 Pain in right hand: Secondary | ICD-10-CM | POA: Diagnosis not present

## 2016-02-14 DIAGNOSIS — M1712 Unilateral primary osteoarthritis, left knee: Secondary | ICD-10-CM | POA: Diagnosis not present

## 2016-02-27 DIAGNOSIS — M1712 Unilateral primary osteoarthritis, left knee: Secondary | ICD-10-CM | POA: Diagnosis not present

## 2016-03-01 DIAGNOSIS — M1712 Unilateral primary osteoarthritis, left knee: Secondary | ICD-10-CM | POA: Diagnosis not present

## 2016-03-06 DIAGNOSIS — M1712 Unilateral primary osteoarthritis, left knee: Secondary | ICD-10-CM | POA: Diagnosis not present

## 2016-03-27 DIAGNOSIS — M1712 Unilateral primary osteoarthritis, left knee: Secondary | ICD-10-CM | POA: Diagnosis not present

## 2016-03-29 DIAGNOSIS — M1712 Unilateral primary osteoarthritis, left knee: Secondary | ICD-10-CM | POA: Diagnosis not present

## 2016-04-05 DIAGNOSIS — M1712 Unilateral primary osteoarthritis, left knee: Secondary | ICD-10-CM | POA: Diagnosis not present

## 2016-04-11 DIAGNOSIS — M1712 Unilateral primary osteoarthritis, left knee: Secondary | ICD-10-CM | POA: Diagnosis not present

## 2016-04-22 ENCOUNTER — Encounter (HOSPITAL_BASED_OUTPATIENT_CLINIC_OR_DEPARTMENT_OTHER): Payer: Self-pay | Admitting: *Deleted

## 2016-04-22 ENCOUNTER — Emergency Department (HOSPITAL_BASED_OUTPATIENT_CLINIC_OR_DEPARTMENT_OTHER)
Admission: EM | Admit: 2016-04-22 | Discharge: 2016-04-22 | Disposition: A | Discharge status: Home / Self Care | Payer: Medicare Other | Attending: Emergency Medicine | Admitting: Emergency Medicine

## 2016-04-22 DIAGNOSIS — Z79899 Other long term (current) drug therapy: Secondary | ICD-10-CM | POA: Diagnosis not present

## 2016-04-22 DIAGNOSIS — I10 Essential (primary) hypertension: Secondary | ICD-10-CM | POA: Diagnosis not present

## 2016-04-22 DIAGNOSIS — Z87891 Personal history of nicotine dependence: Secondary | ICD-10-CM | POA: Insufficient documentation

## 2016-04-22 DIAGNOSIS — L259 Unspecified contact dermatitis, unspecified cause: Secondary | ICD-10-CM

## 2016-04-22 DIAGNOSIS — Z7982 Long term (current) use of aspirin: Secondary | ICD-10-CM | POA: Diagnosis not present

## 2016-04-22 DIAGNOSIS — R21 Rash and other nonspecific skin eruption: Secondary | ICD-10-CM | POA: Diagnosis present

## 2016-04-22 HISTORY — DX: Essential (primary) hypertension: I10

## 2016-04-22 HISTORY — DX: Carpal tunnel syndrome, unspecified upper limb: G56.00

## 2016-04-22 HISTORY — DX: Unspecified dementia, unspecified severity, without behavioral disturbance, psychotic disturbance, mood disturbance, and anxiety: F03.90

## 2016-04-22 MED ORDER — PREDNISONE 20 MG PO TABS
ORAL_TABLET | ORAL | 0 refills | Status: DC
Start: 2016-04-22 — End: 2016-05-07

## 2016-04-22 NOTE — Discharge Instructions (Signed)
Please read and follow all provided instructions.  Your diagnoses today include:  1. Contact dermatitis     Tests performed today include:  Vital signs. See below for your results today.   Medications prescribed:   Prednisone - steroid medicine   It is best to take this medication in the morning to prevent sleeping problems. If you are diabetic, monitor your blood sugar closely and stop taking Prednisone if blood sugar is over 300. Take with food to prevent stomach upset.   Take any prescribed medications only as directed.  Home care instructions:   Follow any educational materials contained in this packet  Follow-up instructions: Please follow-up with your primary care provider in the next 3 days for further evaluation of your symptoms.   Return instructions:   Please return to the Emergency Department if you experience worsening symptoms.   Call 9-1-1 immediately if you have an allergic reaction that involves your lips, mouth, throat or if you have any difficulty breathing. This is a life-threatening emergency.   Please return if you have any other emergent concerns.  Additional Information:  Your vital signs today were: BP 158/86 (BP Location: Right Arm)    Pulse 63    Temp 97.7 F (36.5 C) (Oral)    Resp 16    Ht 5\' 7"  (1.702 m)    Wt 75.3 kg    SpO2 100%    BMI 26.00 kg/m  If your blood pressure (BP) was elevated above 135/85 this visit, please have this repeated by your doctor within one month. --------------

## 2016-04-22 NOTE — ED Provider Notes (Signed)
[facility]   CSN: [id number] Arrival date & time: 04/22/16  1037     History   Chief Complaint Chief Complaint  Patient presents with  . Rash    HPI [name] is a 65 y.o. female.  Patient presents with complaint of rash noted around her left eye, on her right forearm and in the web spaces between her left fingers. Rash is itchy. It began after she was gardening and pulling weeds at her house. No lip swelling or difficulty breathing. No pain or in her eye or vision change. No fevers, vomiting, diarrhea, chest pain or shortness of breath. She has been treating with Caladryl lotion with some relief. No known sick contacts. Her husband does not have similar symptoms. No new medications or medication changes. The onset of this condition was acute. The course is constant. Aggravating factors: none. Alleviating factors: none.        Past Medical History:  Diagnosis Date  . Carpal tunnel syndrome   . Dementia   . Eczema   . High cholesterol   . High cholesterol   . Hypertension   . Psoriasis     Patient Active Problem List   Diagnosis Date Noted  . Major neurocognitive disorder 11/27/2015  . Alzheimer's dementia 11/02/2015  . Vertigo 05/02/2015  . Subacute confusional state 05/02/2015  . Acute encephalopathy   . Memory deficit   . Hyperlipidemia   . Prediabetes   . TIA (transient ischemic attack) 04/16/2015    Past Surgical History:  Procedure Laterality Date  . HAND SURGERY     Carpal tunnel  . KNEE SURGERY    . SHOULDER SURGERY    . TONSILLECTOMY      OB History    No data available       Home Medications    Prior to Admission medications   Medication Sig Start Date End Date Taking? Authorizing Provider  aspirin EC 81 MG tablet Take 1 tablet (81 mg total) by mouth daily. 05/02/15  Yes Anson FretAntonia B Ahern, MD  losartan (COZAAR) 50 MG tablet Take 50 mg by mouth daily. 09/30/15  Yes Historical Provider, MD  Rivastigmine (EXELON  TD) Place onto the skin.   Yes Historical Provider, MD  predniSONE (DELTASONE) 20 MG tablet 3 Tabs PO Days 1-3, then 2 tabs PO Days 4-6, then 1 tab PO Day 7-9, then Half Tab PO Day 10-12 04/22/16   Renne CriglerJoshua Yulieth Carrender, PA-C    Family History Family History  Problem Relation Age of Onset  . Cancer Father     Social History Social History  Substance Use Topics  . Smoking status: Former Smoker    Quit date: 08/20/2004  . Smokeless tobacco: Never Used  . Alcohol use No     Allergies   Atorvastatin; Ibuprofen; and Nsaids   Review of Systems Review of Systems  Constitutional: Negative for fever.  HENT: Negative for facial swelling and trouble swallowing.   Eyes: Negative for redness.  Respiratory: Negative for shortness of breath, wheezing and stridor.   Cardiovascular: Negative for chest pain.  Gastrointestinal: Negative for nausea and vomiting.  Musculoskeletal: Negative for myalgias.  Skin: Positive for rash.  Neurological: Negative for light-headedness.  Psychiatric/Behavioral: Negative for confusion.     Physical Exam Updated Vital Signs BP 158/86 (BP Location: Right Arm)   Pulse 63   Temp 97.7 F (36.5 C) (Oral)   Resp 16   Ht 5\' 7"  (1.702 m)   Wt 75.3 kg  SpO2 100%   BMI 26.00 kg/m   Physical Exam  Constitutional: She appears well-developed and well-nourished.  HENT:  Head: Normocephalic and atraumatic.  Eyes: Conjunctivae and EOM are normal. Pupils are equal, round, and reactive to light. Right eye exhibits no discharge. Left eye exhibits no discharge.  Sclera and cornea appear normal. No pain with movement of the eye.  Neck: Normal range of motion. Neck supple.  Pulmonary/Chest: No respiratory distress.  Neurological: She is alert.  Skin: Skin is warm and dry. Erythema:    Patient with erythema maculopapular rash noted around the left eye. Areas are more vesicular, smaller on the right forearm and in the webspaces of the left hand. This is consistent with a  contact dermatitis.  Psychiatric: She has a normal mood and affect.  Nursing note and vitals reviewed.    ED Treatments / Results   Procedures Procedures (including critical care time)  Medications Ordered in ED Medications - No data to display   Initial Impression / Assessment and Plan / ED Course  I have reviewed the triage vital signs and the nursing notes.  Pertinent labs & imaging results that were available during my care of the patient were reviewed by me and considered in my medical decision making (see chart for details).  Clinical Course   Patient seen and examined.   Vital signs reviewed and are as follows: BP 158/86 (BP Location: Right Arm)   Pulse 63   Temp 97.7 F (36.5 C) (Oral)   Resp 16   Ht 5\' 7"  (1.702 m)   Wt 75.3 kg   SpO2 100%   BMI 26.00 kg/m   Discussed contact dermatitis. Discussed that patient would likely do best on an oral course of steroids given location of the rash around her eye which is bothersome to her. Discussed use of prednisone and to take at breakfast with food. She may use over-the-counter hydrocortisone on extremity rash if desired. She may continue Caladryl if desired.   Final Clinical Impressions(s) / ED Diagnoses   Final diagnoses:  Contact dermatitis   Patient with rash consistent with contact dermatitis. No concern for anaphylaxis. No new medications or signs of Stevens-Johnson syndrome or TTN. No concern for cellulitis or herpes zoster.  New Prescriptions New Prescriptions   PREDNISONE (DELTASONE) 20 MG TABLET    3 Tabs PO Days 1-3, then 2 tabs PO Days 4-6, then 1 tab PO Day 7-9, then Half Tab PO Day [address] [place] 04/22/16 1158    Eber Hong, MD 04/23/16 [phone]

## 2016-04-22 NOTE — ED Triage Notes (Signed)
Pt reports pulling weeds this past Thursday and now has a rash to L face and neck. Reports it itches. Denies fever, n/v/d, pain, sob, chest pain. Pt has pink area to L face and neck and small, red, raised areas to R forearm and hand and in between fingers on bil hands. Reports treating with Benadryl cream and calamine lotion.

## 2016-05-07 ENCOUNTER — Ambulatory Visit (INDEPENDENT_AMBULATORY_CARE_PROVIDER_SITE_OTHER): Payer: Medicare Other | Admitting: Neurology

## 2016-05-07 ENCOUNTER — Encounter: Payer: Self-pay | Admitting: Neurology

## 2016-05-07 VITALS — BP 181/86 | HR 69 | Ht 67.0 in | Wt 177.0 lb

## 2016-05-07 DIAGNOSIS — G309 Alzheimer's disease, unspecified: Secondary | ICD-10-CM

## 2016-05-07 DIAGNOSIS — F0280 Dementia in other diseases classified elsewhere without behavioral disturbance: Secondary | ICD-10-CM

## 2016-05-07 NOTE — Progress Notes (Signed)
[facility]   Provider: Dr Lucia Gaskins Referring Provider: Layne Benton, NP Primary Care Physician: Dr. Wylene Simmer  CC: memory loss  Interval history 05/07/2016:  She is on Exelon and she is doing great. Here with husband.  She is on 4.6mg . She love sit she is doing well. She is doing well on the current dose and does not want to increase. She has been on the Exelon for 4-5 months. She did not tolerate the Aricept. She feels she is better, patient's husband goes to work every day, she drives and does not get lost and has not had any issues at all. Husband is present with her when she drives and she drives well, no problems, no getting lost, no confusion. Husband is here. He calls her throughout the day. She is doing well at home, no issues in the house and no accidents in the home. She is walking and staying active. Discussed FDG brain scan.    FDG PET Scan: Biparietal decreased cortical metabolism greater on the LEFT coupled with more subtle decreased bitemporal cortical metabolism would be consistent with an Alzheimer's pathology pattern.  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  Her scores with predominantly memory disruption and executive dysfunction, as well as the pattern of her language scores, with intact letter fluency but impairment in confrontation naming and semantic fluency, is suggestive of temporal lobe dysfunction as would be seen in Alzheimer's disease. The reported course of her symptoms is consistent with what is expected in Alzheimer's disease. It is also unexpected that the level of cognitive disruption that she is fully managing her ADLs such as finances without trouble. At this time there is no evidence of potential new disruption that would adversely impact her cognitive functioning. Patient should attempt to  minimize distractions in her environment. She should not be driving.  Interval history 06/27/2015: Here for follow up on memory loss. EEG showed diffuse background theta frequency slowing. Lab testing was unremarkable including rpr, hiv, ammonia, b12, tsh. They followed up with primary care and pcp mentioned vascular dementia was discussed however I stated MRi of the brain was normal for age and MRA of the brain was unremarkable, no significant white matter changes or vascular processes were seen. Will send for neuropsychological testing. She has been under a lot of stress with her husband, husband is an alcoholic. His drinking has worsened. Patient's MMSE was 24/30 at last appointment. Will perform a MoCA today. She has had some vaginal discomfort but no UTI as far as they know. Her cholesterol was checked. They did bloodwork. No other metabolic/toxic causes of memory loss or eeg slowing was found. BP is 165/90 with P66, advised to call primary care and consider adding medication for her elevated BP. She snores, she is excessively tired during the day, she needs to nap. No dementia in the family, mother is 48 and father died in 70s.   HPI: AL GAGEN is a 65 y.o. female here as a referral from Dr. Catha Gosselin for a TIA. She is here with daughter. Daughter said she staggered, head was hanging down, said she was really dizzy, slurring of speech, reported room was spinning. Patient was in her closet trying to figure out what to wear. Patient doesn't remember any of it. No previous incidents of this type. Daughter said she will go to the grocery store and not get anything. She will  forget how to set the alarm. Since this incident, patient is more confused. She has not been to a doctor in years previous to hospitalization. Patient denies any new medications. Two weeks before this happened she had surgery on her hand for Dupuytren's contracture. She gets dizzy and confused in the grocery store. Patient doesn't  remember a lot of these things. More short-term memory loss. Things that happened a long time she remembers. They believe patient's mother may have Alzheimers. No depression or mood disorder. No new medications. Memory problems started 6-12 months ago and getting worse. Aspirin had been held for surgery. Started statin in the hospital. Denies headache, any current neurologic symptoms, CP, SOB, visual changes. She is having episodic vertigo and memory changes as above.   Reviewed notes, labs and imaging from outside physicians, which showed:  Per neurology notes inpatient: ULANI DEGRASSE is a 65 y.o. female who believed to be healthy but has not had any regular medical follow-up for several years. She had surgery on her right hand approximately one week ago performed by Dr. Amanda Pea for a Dupuytren's contracture. The patient's daughter drove over to her mother's house to pick up something. Her mother walked out of the house towards the car and her daughter noted that her mother appeared to be staggering. She helped her mother back in to the house, sat her down, and called 911. The daughter noticed some mild dysarthria and said that this was also noted by the EMTs.She also felt that her mother was mildly confused. The patient felt as if the room was spinning. The patient herself did not remember much about the incident. She remembers working in the house and getting ready to go out to the car; however, she is foggy about the details. The patient's speech was back to normal in the emergency department, although she still seemed to have some mild cognitive difficulties. When her daughter tried to help her mother get up and go to the bathroom in the ER the patient was once again dizzy. She does not feel dizzy lying down. The patient's daughter estimates that the worst of these symptoms lasted approximately 2 hours. The patient denied any recent headaches, palpitations, nausea or vomiting, visual changes, speech  difficulties, changes in hearing, abnormal sounds or ringing in her ears.  The patient's husband and daughter both report that the patient's memory has been getting progressively worse over the past 6 months to one year. She had been taking aspirin 81 mg daily but this was recently held for her hand surgery.  MRI HEAD FINDINGS personally reviewed images and agree with the following.   The ventricles and sulci are normal for patient's age. No abnormal parenchymal signal, mass lesions, mass effect. No reduced diffusion to suggest acute ischemia. No susceptibility artifact to suggest hemorrhage.  No abnormal extra-axial fluid collections. No extra-axial masses though, contrast enhanced sequences would be more sensitive. Normal major intracranial vascular flow voids seen at the skull base.  Ocular globes and orbital contents are unremarkable though not tailored for evaluation. No abnormal sellar expansion. Visualized paranasal sinuses and mastoid air cells are well-aerated, LEFT concha bullosa. No suspicious calvarial bone marrow signal. No abnormal sellar expansion. Craniocervical junction maintained.  MRA HEAD FINDINGS  Anterior circulation: Normal flow related enhancement of the included cervical, petrous, cavernous and supra clinoid internal carotid arteries. Very mild luminal irregularity LEFT cavernous carotid artery corresponding to calcific atherosclerosis on prior head CT. Patent anterior communicating artery. Normal flow related enhancement of the anterior  and middle cerebral arteries, including more distal segments.  No large vessel occlusion, high-grade stenosis, abnormal luminal irregularity, aneurysm.  Posterior circulation: LEFT vertebral artery is dominant. Basilar artery is patent, with normal flow related enhancement of the main branch vessels. Fetal origin RIGHT posterior cerebral artery. Small LEFT posterior communicating artery present. Normal flow  related enhancement of the posterior cerebral arteries.  No large vessel occlusion, high-grade stenosis, abnormal luminal irregularity, aneurysm.  IMPRESSION: MRI HEAD: No acute intracranial process; normal noncontrast MRI of the brain for age.  MRA HEAD: No acute large vessel occlusion or high-grade stenosis ; complete circle of Willis.  hgba1c 6 LDL unable to calculate given such high triglycerides B12 349 TSH 2.5 CBC,CMP unremarkable  Study Conclusions  - Left ventricle: The cavity size was normal. There was moderate concentric hypertrophy. Systolic function was normal. The estimated ejection fraction was in the range of 55% to 60%. Wall motion was normal; there were no regional wall motion abnormalities.  Impressions:  - No cardiac source of emboli was indentified.  Review of Systems: Patient complains of symptoms per HPI as well as the following symptoms: weight loss, CP, snoring, rash, confusion, dizziness. Pertinent negatives per HPI. All others negative.   Social History   Social History  . Marital status: Married    Spouse name: N/A  . Number of children: 2  . Years of education: 12   Occupational History  . Not on file.   Social History Main Topics  . Smoking status: Former Smoker    Quit date: 08/20/2004  . Smokeless tobacco: Never Used  . Alcohol use No  . Drug use: No  . Sexual activity: Yes   Other Topics Concern  . Not on file   Social History Narrative   Lives at home with husband.    Drinks coffee daily     Family History  Problem Relation Age of Onset  . Cancer Father     Past Medical History:  Diagnosis Date  . Carpal tunnel syndrome   . Dementia   . Eczema   . High cholesterol   . High cholesterol   . Hypertension   . Psoriasis     Past Surgical History:  Procedure Laterality Date  . HAND SURGERY     Carpal tunnel  . KNEE SURGERY    . SHOULDER SURGERY    . TONSILLECTOMY      Current Outpatient  Prescriptions  Medication Sig Dispense Refill  . aspirin EC 81 MG tablet Take 1 tablet (81 mg total) by mouth daily. 30 tablet 6  . losartan (COZAAR) 50 MG tablet Take 50 mg by mouth daily.  5  . Rivastigmine (EXELON TD) Place onto the skin.     No current facility-administered medications for this visit.     Allergies as of 05/07/2016 - Review Complete 04/22/2016  Allergen Reaction Noted  . Atorvastatin Other (See Comments) 11/22/2015  . Ibuprofen  03/28/2013  . Nsaids Other (See Comments) 11/22/2015    Vitals: BP (!) 181/86 (BP Location: Right Arm, Patient Position: Sitting, Cuff Size: Normal)   Pulse 69   Ht 5\' 7"  (1.702 m)   Wt 177 lb (80.3 kg)   BMI 27.72 kg/m  Last Weight:  Wt Readings from Last 1 Encounters:  05/07/16 177 lb (80.3 kg)   Last Height:   Ht Readings from Last 1 Encounters:  05/07/16 5\' 7"  (1.702 m)   MMSE - Mini Mental State Exam 05/07/2016 06/27/2015 05/02/2015  Orientation to time  2 4 3   Orientation to Place 5 5 4   Registration 3 3 3   Attention/ Calculation 2 4 5   Recall 0 0 0  Language- name 2 objects 2 2 2   Language- repeat 1 1 1   Language- follow 3 step command 3 3 3   Language- read & follow direction 1 1 1   Write a sentence 1 1 1   Copy design 1 1 1   Total score 21 25 24     Cranial Nerves:  The pupils are equal, round, and reactive to light. The fundi are flat. Visual fields are full to finger confrontation. Extraocular movements are intact. Trigeminal sensation is intact and the muscles of mastication are normal. The face is symmetric. The palate elevates in the midline. Hearing intact. Voice is normal. Shoulder shrug is normal. The tongue has normal motion without fasciculations.   Coordination:  Normal finger to nose and heel to shin. Normal rapid alternating movements.   Gait:  Heel-toe and tandem gait are normal.   Motor Observation:  No asymmetry, no atrophy, and no involuntary movements noted. Tone:  Normal muscle  tone.   Posture:  Posture is normal. normal erect   Strength:  Strength is V/V in the upper and lower limbs.    Sensation: intact to LT   Reflex Exam:  DTR's:  Deep tendon reflexes in the upper and lower extremities are normal bilaterally.  Toes:  The toes are downgoing bilaterally.  Clonus:  Clonus is absent.   ASSESSMENT/PLAN [name] is a 65 y.o. female with history of his tobacco history, psoriasis, and recent right hand surgery presented with transient staggering gait, dizziness, dysarthria, and mild confusion with progressive memory issues. Imaging was negative and patient was diagnosed with possible TIA. Neurocognitive testing and FDG brain scan diagnosed Alzheimer's Dementia. Marland KitchenMoCA was 24 out of 30  And today 21/30.Doing well on Exelon.    FDG PET Scan was consistent with  an Alzheimer's pathology pattern.  She did not tolerate the Aricept but the Exelon is working great she is very happy Dr. Wylene Simmer prescribed it she is on 4.6mg  discussed we can increase it and they want to stay at current dose and at next appointment with me or with Dr. Wylene Simmer consider increasing. After that can consider Namenda.   Neurocognitive testing c/w Major Neurocognitive disorder likely Alzheimer's dementia, given the magnitude of breath of cognitive impairment it is surprising MRI of her brain was unremarkable.. In discussion with patient, her son and daughterat last appointment. Patient was very upset as understandable. Discussed Alzheimer's and the natural progression.   Given her market visual spatial and executive disruption it is recommended she refrain from driving Her neurocognitive report. Unless she passed formal driving evaluation. Provided a referral for patient. If she should not drive until she has is completed.She is driving, I recommend her not driving as well discussed today.   MRI and MRA showed no acute processes. MRI of the brain was  essentially normal.no significant white matter changes or vascular processes were seen. TSH and B12 are normal. No toxic/metabolic anomalies.  Neeeds to follow closely with primary care for management of vascular risk factors. Continue aspirin daily for stroke prevention.Naomie Dean, MD  [facility] [address] [place], [place] [address]  Phone [phone] Fax [phone]  A total of 30  minutes was spent face-to-face with this patient. Over half this time was spent on counseling patient on the Maj. neurocognitive disorder  diagnosis and different  diagnostic and therapeutic options available.

## 2016-05-07 NOTE — Patient Instructions (Signed)
Remember to drink plenty of fluid, eat healthy meals and do not skip any meals. Try to eat protein with a every meal and eat a healthy snack such as fruit or nuts in between meals. Try to keep a regular sleep-wake schedule and try to exercise daily, particularly in the form of walking, 20-30 minutes a day, if you can.   As far as your medications are concerned, I would like to suggest: Continue the Exelon  I would like to see you back in 6-9 months, sooner if we need to. Please call us with any interim questions, concerns, problems, updates or refill requests.   Our phone number is [phone]. We also have an after hours call service for urgent matters and there is a physician on-call for urgent questions. For any emergencies you know to call 911 or go to the nearest emergency room

## 2016-05-24 DIAGNOSIS — M1712 Unilateral primary osteoarthritis, left knee: Secondary | ICD-10-CM | POA: Diagnosis not present

## 2016-06-19 DIAGNOSIS — R8299 Other abnormal findings in urine: Secondary | ICD-10-CM | POA: Diagnosis not present

## 2016-06-19 DIAGNOSIS — N390 Urinary tract infection, site not specified: Secondary | ICD-10-CM | POA: Diagnosis not present

## 2016-06-19 DIAGNOSIS — I10 Essential (primary) hypertension: Secondary | ICD-10-CM | POA: Diagnosis not present

## 2016-06-19 DIAGNOSIS — M1712 Unilateral primary osteoarthritis, left knee: Secondary | ICD-10-CM | POA: Diagnosis not present

## 2016-06-19 DIAGNOSIS — E784 Other hyperlipidemia: Secondary | ICD-10-CM | POA: Diagnosis not present

## 2016-06-26 DIAGNOSIS — Z6829 Body mass index (BMI) 29.0-29.9, adult: Secondary | ICD-10-CM | POA: Diagnosis not present

## 2016-06-26 DIAGNOSIS — Z0000 Encounter for general adult medical examination without abnormal findings: Secondary | ICD-10-CM | POA: Diagnosis not present

## 2016-06-26 DIAGNOSIS — Z1389 Encounter for screening for other disorder: Secondary | ICD-10-CM | POA: Diagnosis not present

## 2016-06-26 DIAGNOSIS — I6529 Occlusion and stenosis of unspecified carotid artery: Secondary | ICD-10-CM | POA: Diagnosis not present

## 2016-06-26 DIAGNOSIS — G309 Alzheimer's disease, unspecified: Secondary | ICD-10-CM | POA: Diagnosis not present

## 2016-06-26 DIAGNOSIS — I679 Cerebrovascular disease, unspecified: Secondary | ICD-10-CM | POA: Diagnosis not present

## 2016-06-26 DIAGNOSIS — I10 Essential (primary) hypertension: Secondary | ICD-10-CM | POA: Diagnosis not present

## 2016-06-26 DIAGNOSIS — E784 Other hyperlipidemia: Secondary | ICD-10-CM | POA: Diagnosis not present

## 2016-06-26 DIAGNOSIS — Z23 Encounter for immunization: Secondary | ICD-10-CM | POA: Diagnosis not present

## 2016-07-06 IMAGING — MR MR MRA HEAD W/O CM
10 of 11 series · 31 of 48 positions shown · non-contrast
Comparison: CT head April 16, 2015

CLINICAL DATA: Gait abnormality beginning yesterday morning,
confusion, dizziness. Hand surgery 1 week ago. Assess transient
ischemic attack.

EXAM:
MRI HEAD WITHOUT CONTRAST
MRA HEAD WITHOUT CONTRAST
TECHNIQUE: Multiplanar, multiecho pulse sequences of the brain and surrounding
structures were obtained without intravenous contrast. Angiographic
images of the head were obtained using MRA technique without
contrast.

[Series 3: DWI · axial · 3.0mm · 1.09mm/px · z∈[-87,+36]mm · 7 of 84 slices shown (1 of 4)]
[im 1/84]
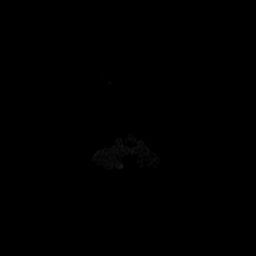
[im 14/84]
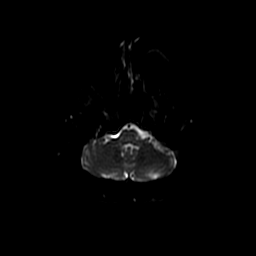
[im 28/84]
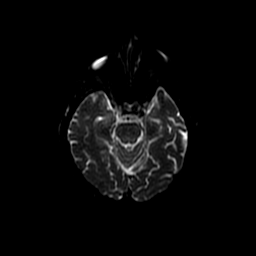
[im 42/84]
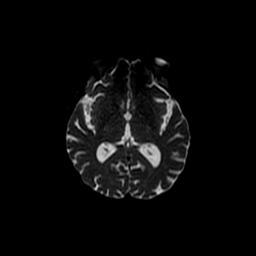
[im 56/84]
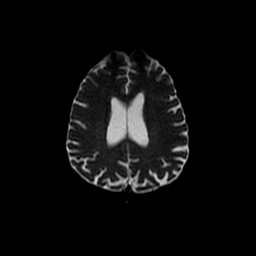
[im 70/84]
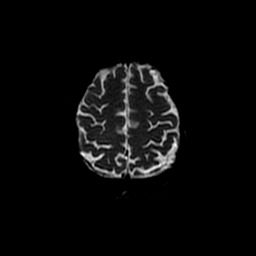
[im 84/84]
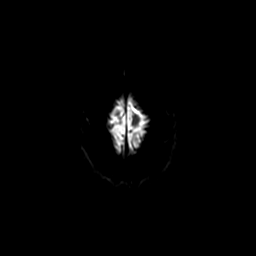

[Series 4: T1 · sagittal · 5.0mm · 0.47mm/px · 2 of 24 slices shown]
[im 1/24]
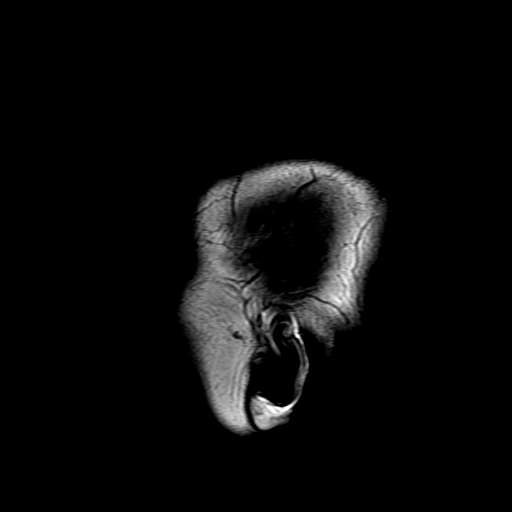
[im 24/24]
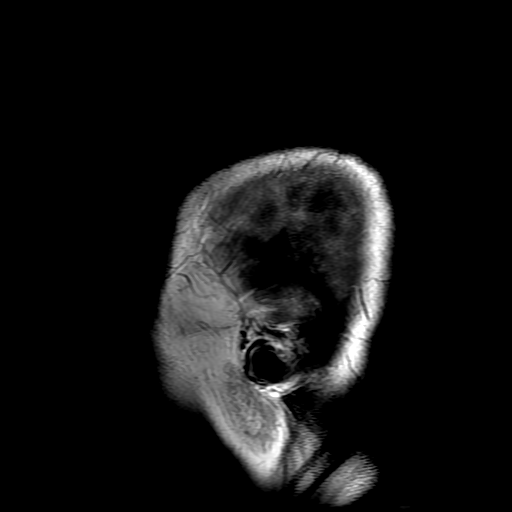

[Series 5: DWI · coronal · 5.0mm · 1.09mm/px · 5 of 66 slices shown (2 of 4)]
[im 1/66]
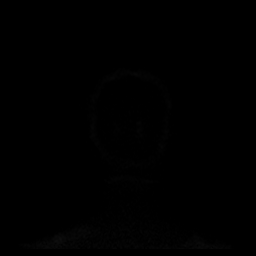
[im 17/66]
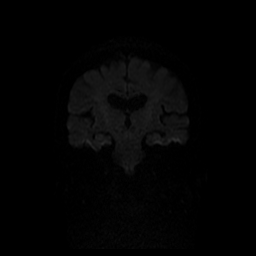
[im 33/66]
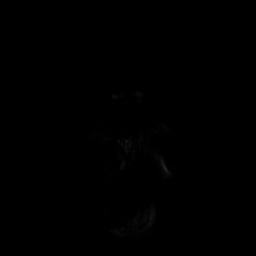
[im 49/66]
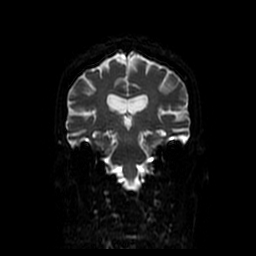
[im 66/66]
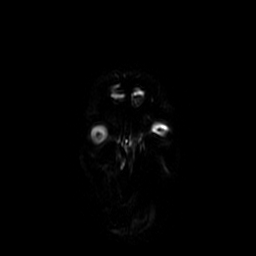

[Series 7: T2 · axial · 5.0mm · 0.43mm/px · z∈[-101,+42]mm · 2 of 25 slices shown (1 of 2)]
[im 1/25]
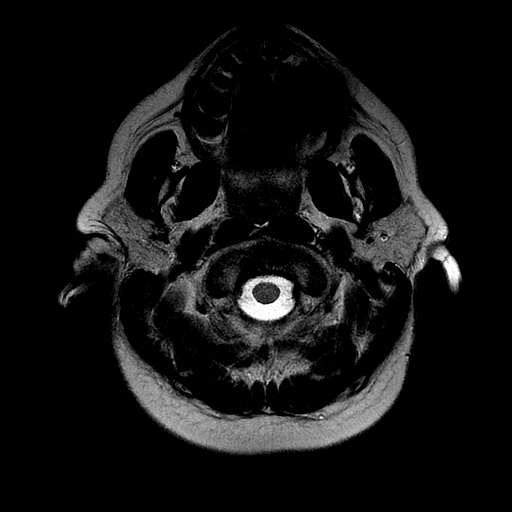
[im 25/25]
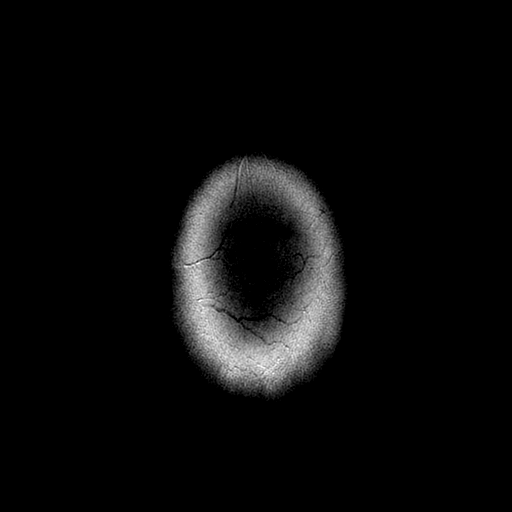

[Series 8: FLAIR · axial · 5.0mm · 0.43mm/px · z∈[-101,+42]mm · 2 of 25 slices shown]
[im 1/25]
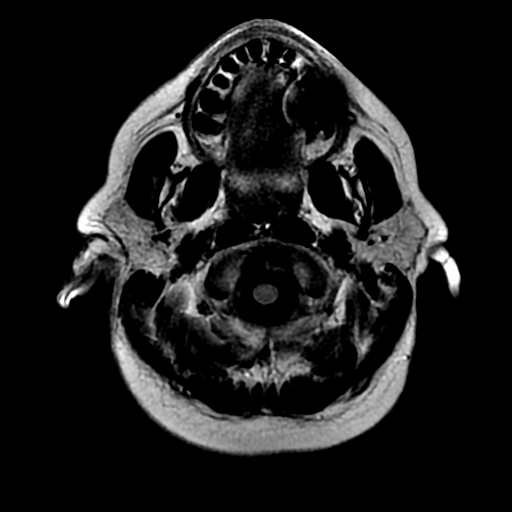
[im 25/25]
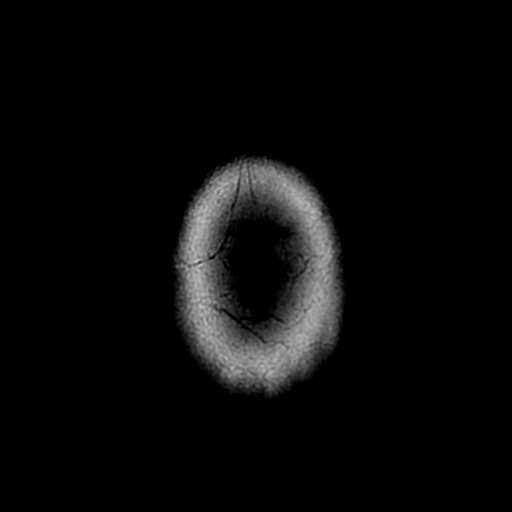

[Series 9: ax mpgr · axial · 5.0mm · 0.43mm/px · z∈[-101,+42]mm · 2 of 25 slices shown]
[im 1/25]
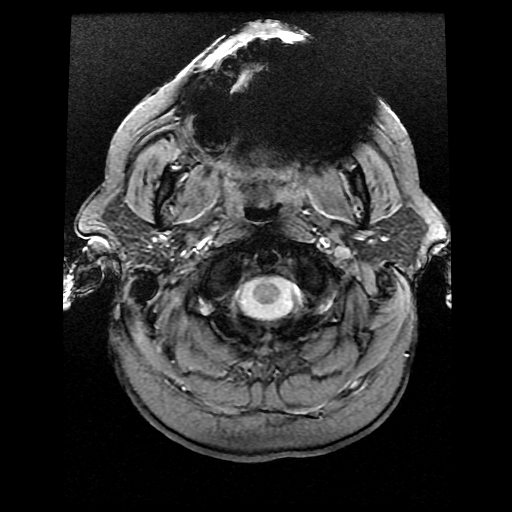
[im 25/25]
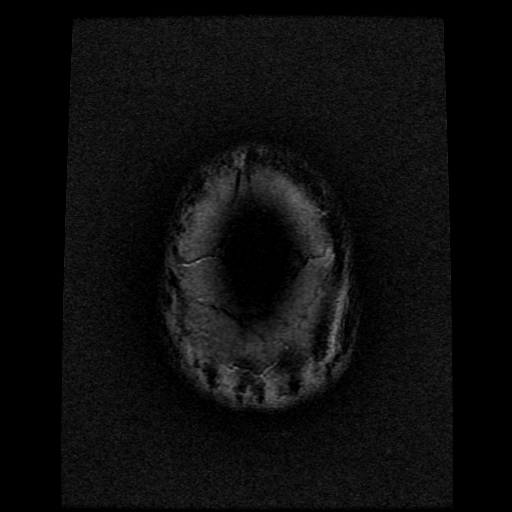

[Series 10: (id) mt fs · axial · 1.4mm · 0.43mm/px · z∈[-109,-81]mm · 3 of 154 slices shown]
[im 1/154]
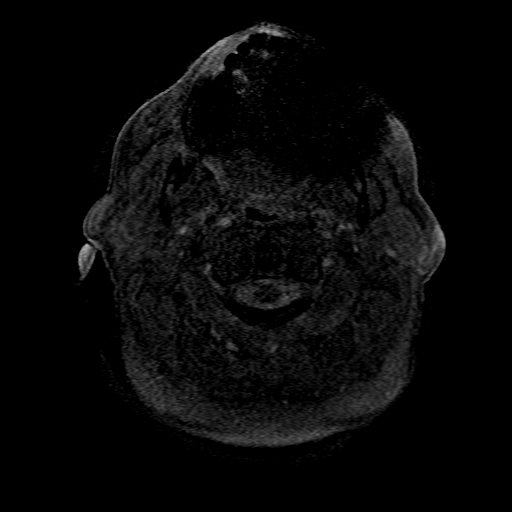
[im 28/154]
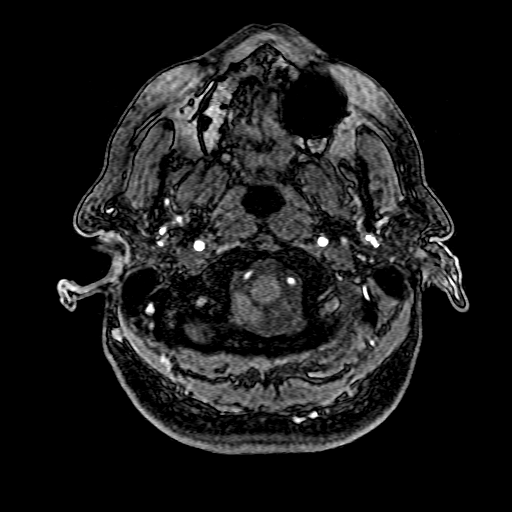
[im 42/154]
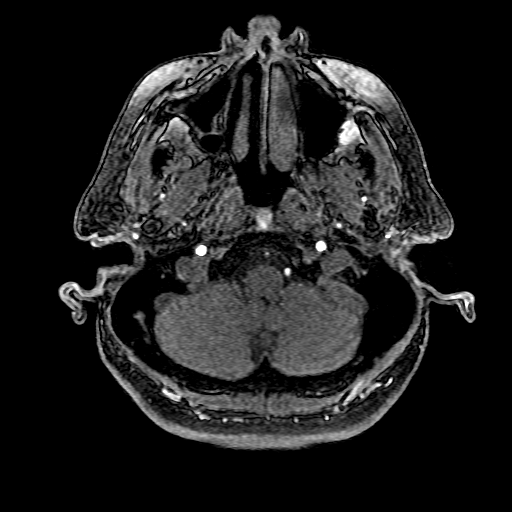

[Series 12: T2 · coronal · 5.0mm · 0.43mm/px · 2 of 28 slices shown (2 of 2)]
[im 1/28]
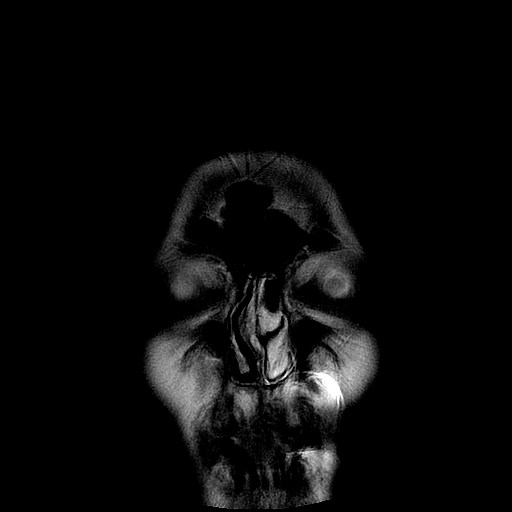
[im 28/28]
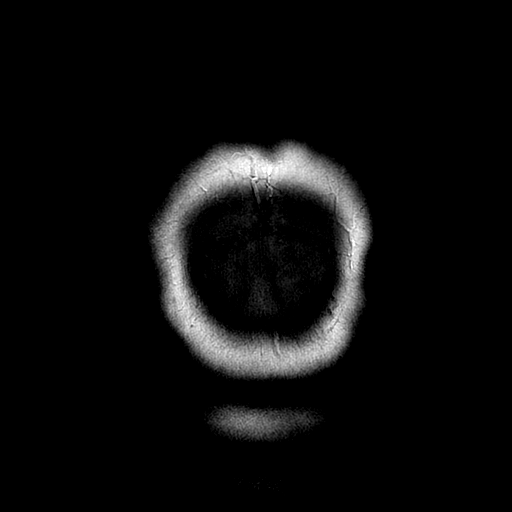

[Series 300: DWI · axial · 3.0mm · 1.09mm/px · z∈[-87,+36]mm · 3 of 42 slices shown (3 of 4)]
[im 1/42]
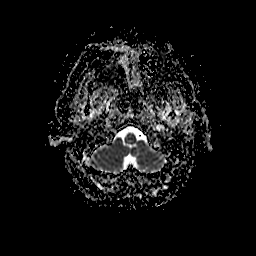
[im 21/42]
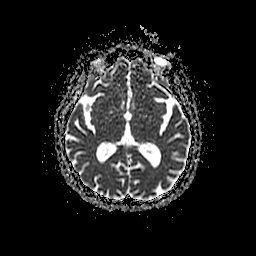
[im 42/42]
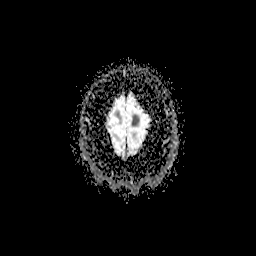

[Series 500: DWI · coronal · 5.0mm · 1.09mm/px · 3 of 33 slices shown (4 of 4)]
[im 1/33]
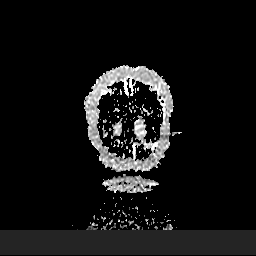
[im 17/33]
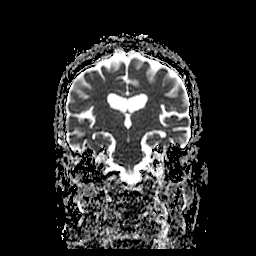
[im 33/33]
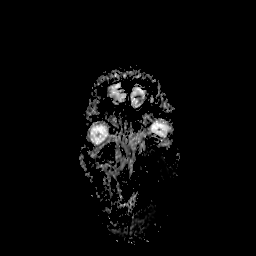

[31 of 48 positions shown; findings below may reference images not displayed]

FINDINGS: MRI HEAD FINDINGS

The ventricles and sulci are normal for patient's age. No abnormal
parenchymal signal, mass lesions, mass effect. No reduced diffusion
to suggest acute ischemia. No susceptibility artifact to suggest
hemorrhage.

No abnormal extra-axial fluid collections. No extra-axial masses
though, contrast enhanced sequences would be more sensitive. Normal
major intracranial vascular flow voids seen at the skull base.

Ocular globes and orbital contents are unremarkable though not
tailored for evaluation. No abnormal sellar expansion. Visualized
paranasal sinuses and mastoid air cells are well-aerated, LEFT
concha bullosa. No suspicious calvarial bone marrow signal. No
abnormal sellar expansion. Craniocervical junction maintained.

MRA HEAD FINDINGS

Anterior circulation: Normal flow related enhancement of the
included cervical, petrous, cavernous and supra clinoid internal
carotid arteries. Very mild luminal irregularity LEFT cavernous
carotid artery corresponding to calcific atherosclerosis on prior
head CT. Patent anterior communicating artery. Normal flow related
enhancement of the anterior and middle cerebral arteries, including
more distal segments.

No large vessel occlusion, high-grade stenosis, abnormal luminal
irregularity, aneurysm.

Posterior circulation: LEFT vertebral artery is dominant. Basilar
artery is patent, with normal flow related enhancement of the main
branch vessels. Fetal origin RIGHT posterior cerebral artery. Small
LEFT posterior communicating artery present. Normal flow related
enhancement of the posterior cerebral arteries.

No large vessel occlusion, high-grade stenosis, abnormal luminal
irregularity, aneurysm.
IMPRESSION: MRI HEAD: No acute intracranial process; normal noncontrast MRI of
the brain for age.

MRA HEAD: No acute large vessel occlusion or high-grade stenosis ;
complete circle of Willis.

## 2016-07-16 ENCOUNTER — Ambulatory Visit (INDEPENDENT_AMBULATORY_CARE_PROVIDER_SITE_OTHER): Payer: Self-pay

## 2016-07-16 ENCOUNTER — Ambulatory Visit (INDEPENDENT_AMBULATORY_CARE_PROVIDER_SITE_OTHER): Payer: Medicare Other | Admitting: Orthopaedic Surgery

## 2016-07-16 ENCOUNTER — Encounter (INDEPENDENT_AMBULATORY_CARE_PROVIDER_SITE_OTHER): Payer: Self-pay

## 2016-07-16 DIAGNOSIS — G8929 Other chronic pain: Secondary | ICD-10-CM | POA: Diagnosis not present

## 2016-07-16 DIAGNOSIS — M1712 Unilateral primary osteoarthritis, left knee: Secondary | ICD-10-CM | POA: Diagnosis not present

## 2016-07-16 DIAGNOSIS — M25562 Pain in left knee: Secondary | ICD-10-CM | POA: Diagnosis not present

## 2016-07-16 MED ORDER — LIDOCAINE HCL 1 % IJ SOLN
3.0000 mL | INTRAMUSCULAR | Status: AC | PRN
  Administered 2016-07-16: 3 mL

## 2016-07-16 MED ORDER — METHYLPREDNISOLONE ACETATE 40 MG/ML IJ SUSP
40.0000 mg | INTRAMUSCULAR | Status: AC | PRN
  Administered 2016-07-16: 40 mg via INTRA_ARTICULAR

## 2016-07-16 NOTE — Progress Notes (Signed)
Office Visit Note   Patient: [name]           Date of Birth: 1951-04-01           MRN: [id number] Visit Date: 07/16/2016              Requested by: Gaspar Garbeichard W Tisovec, MD [id number] [place], [place] [id number] PCP: Gaspar Garbeichard W Tisovec, MD   Assessment & Plan: Visit Diagnoses:  1. Chronic pain of left knee   2. Unilateral primary osteoarthritis, left knee     Plan: She is interested in knee replacement surgery. I showed her to her husband a knee modeling spent in detail with the surgery involves including a thorough discussion of risk and benefits of surgery. She does wish for an intra-articular injection in her left needed to help temporize her pain before surgery. I do think this is reasonable. Her husband a look at his schedule and I'll fill out a surgery scheduling sheet and get some of surgery scheduler. She tolerated the injection well and her knee. We'll work on getting the surgery scheduled. Our goals are decreased pain, improve mobility, and improved overall quality of life. We would then see her back in 2 weeks postoperative.  Follow-Up Instructions: Return if symptoms worsen or fail to improve, for 2 weeks post-op.   Orders:  Orders Placed This Encounter  Procedures  . Large Joint Injection/Arthrocentesis  . XR Knee 1-2 Views Left   No orders of the defined types were placed in this encounter.     Procedures: Large Joint Inj Date/Time: 07/16/2016 1:57 PM Performed by: Kathryne HitchBLACKMAN, Trisa Cranor Y Authorized by: Kathryne HitchBLACKMAN, Sadiya Durand Y   Location:  Knee Site:  L knee Ultrasound Guidance: No   Fluoroscopic Guidance: No   Arthrogram: No   Medications:  40 mg methylPREDNISolone acetate 40 MG/ML; 3 mL lidocaine 1 %     Clinical Data: No additional findings.   Subjective: Chief Complaint  Patient presents with  . Left Knee - Pain    Patient states her left knee has hurt her for quite sometime. [facility] and states she did not  care for him at all, but he did tell her she is "bone on bone". Discuss surgery?    HPI Her knee pain is been slowly getting worse for some time now. However recently has flared up quite significantly. It's very painful at night. He can be 10 out of 10 at times. His detrimentally affected her activities daily living, her quality of life, and her mobility. Review of Systems She denies any headache, shortness of breath, chest pain, fever, chills, nausea, vomiting.  Objective: Vital Signs: There were no vitals taken for this visit.  Physical Exam He is alert and oriented 3 in no acute distress Ortho Exam Examination of her left knee shows slight warmth. There is significant lateral joint line tenderness and patellofemoral crepitation. He is ligament was a stable. Is globally tender. There is a mild effusion as well. Specialty Comments:  No specialty comments available.  Imaging: Xr Knee 1-2 Views Left  Result Date: 07/16/2016 An AP and lateral of the left knee are obtained. These are standing films. Shows complete loss of lateral joint space with only of mild valgus deformity. There is patellofemoral arthritic changes as well. There is no acute bony injury. There is a mild effusion.    PMFS History: Patient Active Problem List   Diagnosis Date Noted  . Major neurocognitive disorder 11/27/2015  .  Alzheimer's dementia 11/02/2015  . Vertigo 05/02/2015  . Subacute confusional state 05/02/2015  . Acute encephalopathy   . Memory deficit   . Hyperlipidemia   . Prediabetes   . TIA (transient ischemic attack) 04/16/2015   Past Medical History:  Diagnosis Date  . Carpal tunnel syndrome   . Dementia   . Eczema   . High cholesterol   . High cholesterol   . Hypertension   . Psoriasis     Family History  Problem Relation Age of Onset  . Cancer Father     Past Surgical History:  Procedure Laterality Date  . HAND SURGERY     Carpal tunnel  . KNEE SURGERY    . SHOULDER SURGERY     . TONSILLECTOMY     Social History   Occupational History  . Not on file.   Social History Main Topics  . Smoking status: Former Smoker    Quit date: 08/20/2004  . Smokeless tobacco: Never Used  . Alcohol use No  . Drug use: No  . Sexual activity: Yes

## 2016-08-03 ENCOUNTER — Telehealth (INDEPENDENT_AMBULATORY_CARE_PROVIDER_SITE_OTHER): Payer: Self-pay

## 2016-08-03 NOTE — Telephone Encounter (Signed)
Husband left voice mail for return call to schedule surgery.  I called husband at [phone] and left voice mail for return call to schedule.

## 2016-08-17 ENCOUNTER — Other Ambulatory Visit (INDEPENDENT_AMBULATORY_CARE_PROVIDER_SITE_OTHER): Payer: Self-pay | Admitting: Physician Assistant

## 2016-08-17 NOTE — Progress Notes (Signed)
Scheduling pre op-  PLEASE PLACE SURGICAL ORDERS IN E{IC  Thanks

## 2016-08-23 ENCOUNTER — Telehealth (INDEPENDENT_AMBULATORY_CARE_PROVIDER_SITE_OTHER): Payer: Self-pay | Admitting: Orthopaedic Surgery

## 2016-08-23 NOTE — Telephone Encounter (Signed)
Can you please let him know that these are not done. We have 7-10 BUSINESS days to fill these out. I will have these completed next week.

## 2016-08-23 NOTE — Telephone Encounter (Signed)
Patient's husband  (tom) called asked if the FMLA papers he left can be faxed today to [facility]. Elijah Birkom said he spoke with them and they have not rec'd the paperwork back. [name] asked to be called when the forms are faxed. [name] said the forms need to be faxed today. The number to him is [phone]

## 2016-08-24 ENCOUNTER — Encounter (HOSPITAL_COMMUNITY)
Admission: RE | Admit: 2016-08-24 | Discharge: 2016-08-24 | Disposition: A | Discharge status: Home / Self Care | Payer: Medicare Other | Source: Ambulatory Visit | Attending: Orthopaedic Surgery | Admitting: Orthopaedic Surgery

## 2016-08-24 ENCOUNTER — Encounter (HOSPITAL_COMMUNITY): Payer: Self-pay

## 2016-08-24 ENCOUNTER — Encounter (INDEPENDENT_AMBULATORY_CARE_PROVIDER_SITE_OTHER): Payer: Self-pay

## 2016-08-24 DIAGNOSIS — Z01810 Encounter for preprocedural cardiovascular examination: Secondary | ICD-10-CM | POA: Diagnosis not present

## 2016-08-24 DIAGNOSIS — Z01812 Encounter for preprocedural laboratory examination: Secondary | ICD-10-CM | POA: Diagnosis not present

## 2016-08-24 DIAGNOSIS — M1712 Unilateral primary osteoarthritis, left knee: Secondary | ICD-10-CM | POA: Diagnosis not present

## 2016-08-24 HISTORY — DX: Unspecified osteoarthritis, unspecified site: M19.90

## 2016-08-24 HISTORY — DX: Unspecified asthma, uncomplicated: J45.909

## 2016-08-24 LAB — CBC
HCT: 40.1 % (ref 36.0–46.0)
HEMOGLOBIN: 13 g/dL (ref 12.0–15.0)
MCH: 29.3 pg (ref 26.0–34.0)
MCHC: 32.4 g/dL (ref 30.0–36.0)
MCV: 90.3 fL (ref 78.0–100.0)
Platelets: 188 10*3/uL (ref 150–400)
RBC: 4.44 MIL/uL (ref 3.87–5.11)
RDW: 14.1 % (ref 11.5–15.5)
WBC: 6.6 10*3/uL (ref 4.0–10.5)

## 2016-08-24 LAB — BASIC METABOLIC PANEL
Anion gap: 7 (ref 5–15)
BUN: 14 mg/dL (ref 6–20)
CHLORIDE: 106 mmol/L (ref 101–111)
CO2: 28 mmol/L (ref 22–32)
Calcium: 9.8 mg/dL (ref 8.9–10.3)
Creatinine, Ser: 0.75 mg/dL (ref 0.44–1.00)
GFR calc Af Amer: >60 mL/min (ref >60)
GFR calc non Af Amer: >60 mL/min (ref >60)
Glucose, Bld: 110 mg/dL — ABNORMAL HIGH (ref 65–99)
POTASSIUM: 5.3 mmol/L — AB (ref 3.5–5.1)
Sodium: 141 mmol/L (ref 135–145)

## 2016-08-24 LAB — SURGICAL PCR SCREEN
MRSA, PCR: NEGATIVE
Staphylococcus aureus: NEGATIVE

## 2016-08-24 NOTE — Progress Notes (Signed)
LOV neuro 05/07/16 in epic, echo 2016 in epic

## 2016-08-24 NOTE — Progress Notes (Signed)
Final EKG done 08/24/16- EPIc

## 2016-08-24 NOTE — Progress Notes (Signed)
Dr. Jonni Sangerariganan (anesthesia) stated ok for pt.to place exelon patch morning of surgery.

## 2016-08-24 NOTE — Patient Instructions (Signed)
Yvette Karvonenhyllis D Sims  08/24/2016   Your procedure is scheduled on: 08/31/2016    [facility]  [facility]  elevators to 3rd floor to  Short Stay Center at   1200noon   Call this number if you have problems the morning of surgery [phone]   Remember: ONLY 1 PERSON MAY GO WITH YOU TO SHORT STAY TO GET  READY MORNING OF YOUR SURGERY.  Do not eat food after midnite. May have clear liquids from 12 midntie until 0730am morning of surgery then nothing by mouth       Take these medicines the morning of surgery with A SIP OF WATER: none                                 You may not have any metal on your body including hair pins and              piercings  Do not wear jewelry, make-up, lotions, powders or perfumes, deodorant             Do not wear nail polish.  Do not shave  48 hours prior to surgery.                Do not bring valuables to the hospital. [place] IS NOT             RESPONSIBLE   FOR VALUABLES.  Contacts, dentures or bridgework may not be worn into surgery.  Leave suitcase in the car. After surgery it may be brought to your room.     :  Special Instructions: N/A              Please read over the following fact sheets you were given: _____________________________________________________________________                CLEAR LIQUID DIET   Foods Allowed                                                                     Foods Excluded  Coffee and tea, regular and decaf                             liquids that you cannot  Plain Jell-O in any flavor                                             see through such as: Fruit ices (not with fruit pulp)                                     milk, soups, orange juice  Iced Popsicles                                    All  solid food Carbonated beverages, regular and diet                                    Cranberry, grape and apple juices Sports drinks like Gatorade Lightly seasoned  clear broth or consume(fat free) Sugar, honey syrup  Sample Menu Breakfast                                Lunch                                     Supper Cranberry juice                    Beef broth                            Chicken broth Jell-O                                     Grape juice                           Apple juice Coffee or tea                        Jell-O                                      Popsicle                                                Coffee or tea                        Coffee or tea  _____________________________________________________________________  [facility] - Preparing for Surgery Before surgery, you can play an important role.  Because skin is not sterile, your skin needs to be as free of germs as possible.  You can reduce the number of germs on your skin by washing with CHG (chlorahexidine gluconate) soap before surgery.  CHG is an antiseptic cleaner which kills germs and bonds with the skin to continue killing germs even after washing. Please DO NOT use if you have an allergy to CHG or antibacterial soaps.  If your skin becomes reddened/irritated stop using the CHG and inform your nurse when you arrive at Short Stay. Do not shave (including legs and underarms) for at least 48 hours prior to the first CHG shower.  You may shave your face/neck. Please follow these instructions carefully:  1.  Shower with CHG Soap the night before surgery and the  morning of Surgery.  2.  If you choose to wash your hair, wash your hair first as usual with your  normal  shampoo.  3.  After you shampoo, rinse your hair and body thoroughly to remove the  shampoo.  4.  Use CHG as you would any other liquid soap.  You can apply chg directly  to the skin and wash                       Gently with a scrungie or clean washcloth.  5.  Apply the CHG Soap to your body ONLY FROM THE NECK DOWN.   Do not use on face/ open                           Wound or  open sores. Avoid contact with eyes, ears mouth and genitals (private parts).                       Wash face,  Genitals (private parts) with your normal soap.             6.  Wash thoroughly, paying special attention to the area where your surgery  will be performed.  7.  Thoroughly rinse your body with warm water from the neck down.  8.  DO NOT shower/wash with your normal soap after using and rinsing off  the CHG Soap.                9.  Pat yourself dry with a clean towel.            10.  Wear clean pajamas.            11.  Place clean sheets on your bed the night of your first shower and do not  sleep with pets. Day of Surgery : Do not apply any lotions/deodorants the morning of surgery.  Please wear clean clothes to the hospital/surgery center.  FAILURE TO FOLLOW THESE INSTRUCTIONS MAY RESULT IN THE CANCELLATION OF YOUR SURGERY PATIENT SIGNATURE_________________________________  NURSE SIGNATURE__________________________________  ________________________________________________________________________   Adam Phenix  An incentive spirometer is a tool that can help keep your lungs clear and active. This tool measures how well you are filling your lungs with each breath. Taking long deep breaths may help reverse or decrease the chance of developing breathing (pulmonary) problems (especially infection) following:  A long period of time when you are unable to move or be active. BEFORE THE PROCEDURE   If the spirometer includes an indicator to show your best effort, your nurse or respiratory therapist will set it to a desired goal.  If possible, sit up straight or lean slightly forward. Try not to slouch.  Hold the incentive spirometer in an upright position. INSTRUCTIONS FOR USE  1. Sit on the edge of your bed if possible, or sit up as far as you can in bed or on a chair. 2. Hold the incentive spirometer in an upright position. 3. Breathe out normally. 4. Place the  mouthpiece in your mouth and seal your lips tightly around it. 5. Breathe in slowly and as deeply as possible, raising the piston or the ball toward the top of the column. 6. Hold your breath for 3-5 seconds or for as long as possible. Allow the piston or ball to fall to the bottom of the column. 7. Remove the mouthpiece from your mouth and breathe out normally. 8. Rest for a few seconds and repeat Steps 1 through 7 at least 10 times every 1-2 hours when you are awake. Take your time and take a few normal breaths between deep breaths. 9. The spirometer may include an indicator to  show your best effort. Use the indicator as a goal to work toward during each repetition. 10. After each set of 10 deep breaths, practice coughing to be sure your lungs are clear. If you have an incision (the cut made at the time of surgery), support your incision when coughing by placing a pillow or rolled up towels firmly against it. Once you are able to get out of bed, walk around indoors and cough well. You may stop using the incentive spirometer when instructed by your caregiver.  RISKS AND COMPLICATIONS  Take your time so you do not get dizzy or light-headed.  If you are in pain, you may need to take or ask for pain medication before doing incentive spirometry. It is harder to take a deep breath if you are having pain. AFTER USE  Rest and breathe slowly and easily.  It can be helpful to keep track of a log of your progress. Your caregiver can provide you with a simple table to help with this. If you are using the spirometer at home, follow these instructions: [place] IF:   You are having difficultly using the spirometer.  You have trouble using the spirometer as often as instructed.  Your pain medication is not giving enough relief while using the spirometer.  You develop fever of 100.5 F (38.1 C) or higher. SEEK IMMEDIATE MEDICAL CARE IF:   You cough up bloody sputum that had not been present  before.  You develop fever of 102 F (38.9 C) or greater.  You develop worsening pain at or near the incision site. MAKE SURE YOU:   Understand these instructions.  Will watch your condition.  Will get help right away if you are not doing well or get worse. Document Released: 12/17/2006 Document Revised: 10/29/2011 Document Reviewed: 02/17/2007 [facility] Patient Information 2014 [place], [place].   ________________________________________________________________________

## 2016-08-24 NOTE — Progress Notes (Signed)
BMP  08/24/16  routed via EPIC to Dr. Magnus IvanBlackman

## 2016-08-31 ENCOUNTER — Encounter (HOSPITAL_COMMUNITY)
Admission: RE | Disposition: A | Discharge status: Home Health | Payer: Self-pay | Source: Ambulatory Visit | Attending: Orthopaedic Surgery

## 2016-08-31 ENCOUNTER — Inpatient Hospital Stay (HOSPITAL_COMMUNITY)
Admission: RE | Admit: 2016-08-31 | Discharge: 2016-09-03 | DRG: 470 | Disposition: A | Discharge status: Home Health | Payer: Medicare Other | Source: Ambulatory Visit | Attending: Orthopaedic Surgery | Admitting: Orthopaedic Surgery

## 2016-08-31 ENCOUNTER — Inpatient Hospital Stay (HOSPITAL_COMMUNITY): Payer: Medicare Other | Admitting: Anesthesiology

## 2016-08-31 ENCOUNTER — Inpatient Hospital Stay (HOSPITAL_COMMUNITY): Payer: Medicare Other

## 2016-08-31 ENCOUNTER — Encounter (HOSPITAL_COMMUNITY): Payer: Self-pay | Admitting: *Deleted

## 2016-08-31 DIAGNOSIS — F0280 Dementia in other diseases classified elsewhere without behavioral disturbance: Secondary | ICD-10-CM | POA: Diagnosis present

## 2016-08-31 DIAGNOSIS — Z886 Allergy status to analgesic agent status: Secondary | ICD-10-CM | POA: Diagnosis not present

## 2016-08-31 DIAGNOSIS — E7800 Pure hypercholesterolemia, unspecified: Secondary | ICD-10-CM | POA: Diagnosis not present

## 2016-08-31 DIAGNOSIS — Z87891 Personal history of nicotine dependence: Secondary | ICD-10-CM

## 2016-08-31 DIAGNOSIS — M21062 Valgus deformity, not elsewhere classified, left knee: Secondary | ICD-10-CM | POA: Diagnosis present

## 2016-08-31 DIAGNOSIS — R7303 Prediabetes: Secondary | ICD-10-CM | POA: Diagnosis present

## 2016-08-31 DIAGNOSIS — Z8673 Personal history of transient ischemic attack (TIA), and cerebral infarction without residual deficits: Secondary | ICD-10-CM | POA: Diagnosis not present

## 2016-08-31 DIAGNOSIS — M1712 Unilateral primary osteoarthritis, left knee: Secondary | ICD-10-CM | POA: Diagnosis not present

## 2016-08-31 DIAGNOSIS — Z888 Allergy status to other drugs, medicaments and biological substances status: Secondary | ICD-10-CM | POA: Diagnosis not present

## 2016-08-31 DIAGNOSIS — Z471 Aftercare following joint replacement surgery: Secondary | ICD-10-CM | POA: Diagnosis not present

## 2016-08-31 DIAGNOSIS — I10 Essential (primary) hypertension: Secondary | ICD-10-CM | POA: Diagnosis not present

## 2016-08-31 DIAGNOSIS — Z96652 Presence of left artificial knee joint: Secondary | ICD-10-CM | POA: Diagnosis not present

## 2016-08-31 DIAGNOSIS — G309 Alzheimer's disease, unspecified: Secondary | ICD-10-CM | POA: Diagnosis not present

## 2016-08-31 DIAGNOSIS — G8918 Other acute postprocedural pain: Secondary | ICD-10-CM | POA: Diagnosis not present

## 2016-08-31 DIAGNOSIS — J45909 Unspecified asthma, uncomplicated: Secondary | ICD-10-CM | POA: Diagnosis not present

## 2016-08-31 HISTORY — PX: TOTAL KNEE ARTHROPLASTY: SHX125

## 2016-08-31 SURGERY — ARTHROPLASTY, KNEE, TOTAL
Anesthesia: General | Site: Knee | Laterality: Left

## 2016-08-31 MED ORDER — OXYCODONE HCL 5 MG PO TABS
5.0000 mg | ORAL_TABLET | ORAL | Status: DC | PRN
  Administered 2016-08-31: 5 mg via ORAL
  Administered 2016-09-01 – 2016-09-02 (×6): 10 mg via ORAL
  Administered 2016-09-03: 5 mg via ORAL
  Administered 2016-09-03 (×2): 10 mg via ORAL
  Filled 2016-08-31 (×7): qty 2
  Filled 2016-08-31: qty 1
  Filled 2016-08-31 (×2): qty 2

## 2016-08-31 MED ORDER — PROPOFOL 10 MG/ML IV BOLUS
INTRAVENOUS | Status: AC
  Filled 2016-08-31: qty 40

## 2016-08-31 MED ORDER — MENTHOL 3 MG MT LOZG: 1.0000 | LOZENGE | OROMUCOSAL | Status: DC | PRN

## 2016-08-31 MED ORDER — ONDANSETRON HCL 4 MG/2ML IJ SOLN
INTRAMUSCULAR | Status: DC | PRN
  Administered 2016-08-31: 4 mg via INTRAVENOUS

## 2016-08-31 MED ORDER — STERILE WATER FOR IRRIGATION IR SOLN
Status: DC | PRN
  Administered 2016-08-31: 3000 mL

## 2016-08-31 MED ORDER — ONDANSETRON HCL 4 MG/2ML IJ SOLN
4.0000 mg | Freq: Four times a day (QID) | INTRAMUSCULAR | Status: DC | PRN
  Administered 2016-09-01 – 2016-09-02 (×2): 4 mg via INTRAVENOUS
  Filled 2016-08-31 (×2): qty 2

## 2016-08-31 MED ORDER — LACTATED RINGERS IV SOLN
INTRAVENOUS | Status: DC
  Administered 2016-08-31 (×2): via INTRAVENOUS

## 2016-08-31 MED ORDER — FENTANYL CITRATE (PF) 100 MCG/2ML IJ SOLN
INTRAMUSCULAR | Status: AC
  Filled 2016-08-31: qty 2

## 2016-08-31 MED ORDER — MIDAZOLAM HCL 5 MG/ML IJ SOLN
2.0000 mg | Freq: Once | INTRAMUSCULAR | Status: AC
  Administered 2016-08-31: 2 mg via INTRAVENOUS

## 2016-08-31 MED ORDER — MEPERIDINE HCL 50 MG/ML IJ SOLN
6.2500 mg | INTRAMUSCULAR | Status: DC | PRN
  Administered 2016-08-31: 12.5 mg via INTRAVENOUS

## 2016-08-31 MED ORDER — PHENYLEPHRINE 40 MCG/ML (10ML) SYRINGE FOR IV PUSH (FOR BLOOD PRESSURE SUPPORT)
PREFILLED_SYRINGE | INTRAVENOUS | Status: AC
  Filled 2016-08-31: qty 10

## 2016-08-31 MED ORDER — 0.9 % SODIUM CHLORIDE (POUR BTL) OPTIME
TOPICAL | Status: DC | PRN
  Administered 2016-08-31: 1000 mL

## 2016-08-31 MED ORDER — PROPOFOL 10 MG/ML IV BOLUS
INTRAVENOUS | Status: AC
  Filled 2016-08-31: qty 20

## 2016-08-31 MED ORDER — PHENYLEPHRINE HCL 10 MG/ML IJ SOLN
INTRAMUSCULAR | Status: DC | PRN
  Administered 2016-08-31: 40 ug/min via INTRAVENOUS

## 2016-08-31 MED ORDER — METOCLOPRAMIDE HCL 5 MG/ML IJ SOLN
5.0000 mg | Freq: Three times a day (TID) | INTRAMUSCULAR | Status: DC | PRN
Start: 2016-08-31 — End: 2016-09-03

## 2016-08-31 MED ORDER — CEFAZOLIN SODIUM-DEXTROSE 2-4 GM/100ML-% IV SOLN
2.0000 g | INTRAVENOUS | Status: AC
  Administered 2016-08-31: 2 g via INTRAVENOUS

## 2016-08-31 MED ORDER — PHENYLEPHRINE HCL 10 MG/ML IJ SOLN
INTRAMUSCULAR | Status: AC
  Filled 2016-08-31: qty 1

## 2016-08-31 MED ORDER — SODIUM CHLORIDE 0.9 % IR SOLN
Status: DC | PRN
  Administered 2016-08-31 (×2): 1000 mL

## 2016-08-31 MED ORDER — BUPIVACAINE HCL (PF) 0.5 % IJ SOLN
INTRAMUSCULAR | Status: AC
  Filled 2016-08-31: qty 30

## 2016-08-31 MED ORDER — METHOCARBAMOL 1000 MG/10ML IJ SOLN
500.0000 mg | Freq: Four times a day (QID) | INTRAVENOUS | Status: DC | PRN
  Filled 2016-08-31: qty 5

## 2016-08-31 MED ORDER — MEPERIDINE HCL 50 MG/ML IJ SOLN
INTRAMUSCULAR | Status: AC
  Filled 2016-08-31: qty 1

## 2016-08-31 MED ORDER — CEFAZOLIN IN D5W 1 GM/50ML IV SOLN
1.0000 g | Freq: Four times a day (QID) | INTRAVENOUS | Status: AC
  Administered 2016-08-31 – 2016-09-01 (×2): 1 g via INTRAVENOUS
  Filled 2016-08-31 (×3): qty 50

## 2016-08-31 MED ORDER — ACETAMINOPHEN 650 MG RE SUPP: 650.0000 mg | Freq: Four times a day (QID) | RECTAL | Status: DC | PRN

## 2016-08-31 MED ORDER — SODIUM CHLORIDE 0.9 % IV SOLN
INTRAVENOUS | Status: DC
  Administered 2016-08-31 – 2016-09-01 (×2): via INTRAVENOUS

## 2016-08-31 MED ORDER — BUPIVACAINE IN DEXTROSE 0.75-8.25 % IT SOLN
INTRATHECAL | Status: DC | PRN
  Administered 2016-08-31: 1.8 mL via INTRATHECAL

## 2016-08-31 MED ORDER — EPINEPHRINE PF 1 MG/ML IJ SOLN
INTRAMUSCULAR | Status: AC
  Filled 2016-08-31: qty 1

## 2016-08-31 MED ORDER — TRANEXAMIC ACID 1000 MG/10ML IV SOLN
1000.0000 mg | INTRAVENOUS | Status: AC
  Administered 2016-08-31: 1000 mg via INTRAVENOUS
  Filled 2016-08-31: qty 1100

## 2016-08-31 MED ORDER — HYDROMORPHONE HCL 1 MG/ML IJ SOLN: 0.2500 mg | INTRAMUSCULAR | Status: DC | PRN

## 2016-08-31 MED ORDER — HYDROMORPHONE HCL 1 MG/ML IJ SOLN
1.0000 mg | INTRAMUSCULAR | Status: DC | PRN
  Administered 2016-08-31 – 2016-09-02 (×8): 1 mg via INTRAVENOUS
  Filled 2016-08-31 (×8): qty 1

## 2016-08-31 MED ORDER — DEXAMETHASONE SODIUM PHOSPHATE 10 MG/ML IJ SOLN
INTRAMUSCULAR | Status: DC | PRN
  Administered 2016-08-31: 10 mg via INTRAVENOUS

## 2016-08-31 MED ORDER — ACETAMINOPHEN 325 MG PO TABS
650.0000 mg | ORAL_TABLET | Freq: Four times a day (QID) | ORAL | Status: DC | PRN
  Administered 2016-09-01: 650 mg via ORAL
  Filled 2016-08-31: qty 2

## 2016-08-31 MED ORDER — PROPOFOL 500 MG/50ML IV EMUL
INTRAVENOUS | Status: DC | PRN
  Administered 2016-08-31: 50 ug/kg/min via INTRAVENOUS

## 2016-08-31 MED ORDER — DOCUSATE SODIUM 100 MG PO CAPS
100.0000 mg | ORAL_CAPSULE | Freq: Two times a day (BID) | ORAL | Status: DC
  Administered 2016-08-31 – 2016-09-03 (×6): 100 mg via ORAL
  Filled 2016-08-31 (×6): qty 1

## 2016-08-31 MED ORDER — DIPHENHYDRAMINE HCL 12.5 MG/5ML PO ELIX
12.5000 mg | ORAL_SOLUTION | ORAL | Status: DC | PRN
  Administered 2016-09-01: 25 mg via ORAL
  Filled 2016-08-31: qty 10

## 2016-08-31 MED ORDER — ONDANSETRON HCL 4 MG/2ML IJ SOLN
INTRAMUSCULAR | Status: AC
  Filled 2016-08-31: qty 2

## 2016-08-31 MED ORDER — PHENOL 1.4 % MT LIQD: 1.0000 | OROMUCOSAL | Status: DC | PRN

## 2016-08-31 MED ORDER — ALUM & MAG HYDROXIDE-SIMETH 200-200-20 MG/5ML PO SUSP: 30.0000 mL | ORAL | Status: DC | PRN

## 2016-08-31 MED ORDER — METOCLOPRAMIDE HCL 5 MG PO TABS
5.0000 mg | ORAL_TABLET | Freq: Three times a day (TID) | ORAL | Status: DC | PRN
Start: 2016-08-31 — End: 2016-09-03

## 2016-08-31 MED ORDER — CHLORHEXIDINE GLUCONATE 4 % EX LIQD: 60.0000 mL | Freq: Once | CUTANEOUS | Status: DC

## 2016-08-31 MED ORDER — MIDAZOLAM HCL 2 MG/2ML IJ SOLN
INTRAMUSCULAR | Status: AC
  Filled 2016-08-31: qty 2

## 2016-08-31 MED ORDER — FENTANYL CITRATE (PF) 100 MCG/2ML IJ SOLN
75.0000 ug | Freq: Once | INTRAMUSCULAR | Status: AC
  Administered 2016-08-31: 75 ug via INTRAVENOUS

## 2016-08-31 MED ORDER — RIVAROXABAN 10 MG PO TABS
10.0000 mg | ORAL_TABLET | Freq: Every day | ORAL | Status: DC
  Administered 2016-09-01: 10 mg via ORAL
  Filled 2016-08-31: qty 1

## 2016-08-31 MED ORDER — CEFAZOLIN SODIUM-DEXTROSE 2-4 GM/100ML-% IV SOLN
INTRAVENOUS | Status: AC
  Filled 2016-08-31: qty 100

## 2016-08-31 MED ORDER — DEXAMETHASONE SODIUM PHOSPHATE 10 MG/ML IJ SOLN
INTRAMUSCULAR | Status: AC
  Filled 2016-08-31: qty 1

## 2016-08-31 MED ORDER — METHOCARBAMOL 500 MG PO TABS
500.0000 mg | ORAL_TABLET | Freq: Four times a day (QID) | ORAL | Status: DC | PRN
  Administered 2016-08-31 – 2016-09-03 (×7): 500 mg via ORAL
  Filled 2016-08-31 (×7): qty 1

## 2016-08-31 MED ORDER — LOSARTAN POTASSIUM 50 MG PO TABS
50.0000 mg | ORAL_TABLET | Freq: Every day | ORAL | Status: DC
  Administered 2016-08-31 – 2016-09-03 (×4): 50 mg via ORAL
  Filled 2016-08-31 (×4): qty 1

## 2016-08-31 MED ORDER — PROPOFOL 10 MG/ML IV BOLUS
INTRAVENOUS | Status: DC | PRN
  Administered 2016-08-31: 10 mg via INTRAVENOUS
  Administered 2016-08-31 (×2): 20 mg via INTRAVENOUS

## 2016-08-31 MED ORDER — ONDANSETRON HCL 4 MG PO TABS: 4.0000 mg | ORAL_TABLET | Freq: Four times a day (QID) | ORAL | Status: DC | PRN

## 2016-08-31 SURGICAL SUPPLY — 50 items
APL SKNCLS STERI-STRIP NONHPOA (GAUZE/BANDAGES/DRESSINGS) ×1
BAG SPEC THK2 15X12 ZIP CLS (MISCELLANEOUS)
BAG ZIPLOCK 12X15 (MISCELLANEOUS) IMPLANT
BANDAGE ACE 6X5 VEL STRL LF (GAUZE/BANDAGES/DRESSINGS) ×2 IMPLANT
BENZOIN TINCTURE PRP APPL 2/3 (GAUZE/BANDAGES/DRESSINGS) ×1 IMPLANT
BLADE SAG 13.0X1.37X90 (BLADE) IMPLANT
BLADE SAG 18X100X1.27 (BLADE) IMPLANT
BOWL SMART MIX CTS (DISPOSABLE) ×2 IMPLANT
CAPT KNEE TOTAL 3 ×1 IMPLANT
CEMENT BONE SIMPLEX SPEEDSET (Cement) ×4 IMPLANT
CLOTH BEACON ORANGE TIMEOUT ST (SAFETY) ×2 IMPLANT
CUFF TOURN SGL QUICK 34 (TOURNIQUET CUFF) ×2
CUFF TRNQT CYL 34X4X40X1 (TOURNIQUET CUFF) ×1 IMPLANT
DRAPE U-SHAPE 47X51 STRL (DRAPES) ×2 IMPLANT
DRSG AQUACEL AG ADV 3.5X10 (GAUZE/BANDAGES/DRESSINGS) ×2 IMPLANT
DRSG PAD ABDOMINAL 8X10 ST (GAUZE/BANDAGES/DRESSINGS) ×2 IMPLANT
DURAPREP 26ML APPLICATOR (WOUND CARE) ×2 IMPLANT
ELECT REM PT RETURN 9FT ADLT (ELECTROSURGICAL) ×2
ELECTRODE REM PT RTRN 9FT ADLT (ELECTROSURGICAL) ×1 IMPLANT
GAUZE SPONGE 4X4 12PLY STRL (GAUZE/BANDAGES/DRESSINGS) ×2 IMPLANT
GAUZE XEROFORM 1X8 LF (GAUZE/BANDAGES/DRESSINGS) IMPLANT
GLOVE BIO SURGEON STRL SZ7.5 (GLOVE) ×4 IMPLANT
GLOVE BIOGEL PI IND STRL 8 (GLOVE) ×2 IMPLANT
GLOVE BIOGEL PI INDICATOR 8 (GLOVE) ×2
GLOVE ECLIPSE 8.0 STRL XLNG CF (GLOVE) ×2 IMPLANT
GOWN STRL REUS W/TWL XL LVL3 (GOWN DISPOSABLE) ×4 IMPLANT
HANDPIECE INTERPULSE COAX TIP (DISPOSABLE) ×2
IMMOBILIZER KNEE 20 (SOFTGOODS) ×2
IMMOBILIZER KNEE 20 THIGH 36 (SOFTGOODS) ×1 IMPLANT
NS IRRIG 1000ML POUR BTL (IV SOLUTION) ×2 IMPLANT
PACK TOTAL KNEE CUSTOM (KITS) ×2 IMPLANT
PADDING CAST COTTON 6X4 STRL (CAST SUPPLIES) ×4 IMPLANT
POSITIONER SURGICAL ARM (MISCELLANEOUS) ×2 IMPLANT
SET HNDPC FAN SPRY TIP SCT (DISPOSABLE) ×1 IMPLANT
SET PAD KNEE POSITIONER (MISCELLANEOUS) ×2 IMPLANT
STAPLER VISISTAT 35W (STAPLE) IMPLANT
STRIP CLOSURE SKIN 1/2X4 (GAUZE/BANDAGES/DRESSINGS) ×1 IMPLANT
SUCTION FRAZIER HANDLE 12FR (TUBING) ×1
SUCTION TUBE FRAZIER 12FR DISP (TUBING) ×1 IMPLANT
SUT MNCRL AB 4-0 PS2 18 (SUTURE) IMPLANT
SUT VIC AB 0 CT1 27 (SUTURE) ×2
SUT VIC AB 0 CT1 27XBRD ANTBC (SUTURE) ×1 IMPLANT
SUT VIC AB 1 CT1 27 (SUTURE) ×4
SUT VIC AB 1 CT1 27XBRD ANTBC (SUTURE) ×2 IMPLANT
SUT VIC AB 2-0 CT1 27 (SUTURE) ×4
SUT VIC AB 2-0 CT1 TAPERPNT 27 (SUTURE) ×2 IMPLANT
TRAY FOLEY W/METER SILVER 16FR (SET/KITS/TRAYS/PACK) ×2 IMPLANT
WATER STERILE IRR 1500ML POUR (IV SOLUTION) ×2 IMPLANT
WRAP KNEE MAXI GEL POST OP (GAUZE/BANDAGES/DRESSINGS) ×2 IMPLANT
YANKAUER SUCT BULB TIP 10FT TU (MISCELLANEOUS) ×2 IMPLANT

## 2016-08-31 NOTE — Brief Op Note (Signed)
08/31/2016  3:51 PM  PATIENT:  Yvette KarvonenPhyllis D Sims  66 y.o. female  PRE-OPERATIVE DIAGNOSIS:  Osteoarthritis left knee  POST-OPERATIVE DIAGNOSIS:  Osteoarthritis left knee  PROCEDURE:  Procedure(s): LEFT TOTAL KNEE ARTHROPLASTY (Left)  SURGEON:  Surgeon(s) and Role:    * Kathryne Hitchhristopher Y Blackman, MD - Primary  PHYSICIAN ASSISTANT: Rexene EdisonGil Clark, PA-C  ANESTHESIA:   regional and spinal  EBL:  Total I/O In: 1000 [I.V.:1000] Out: 300 [Urine:200; Blood:100]  COUNTS:  YES  TOURNIQUET:   Total Tourniquet Time Documented: Thigh (Left) - 46 minutes Total: Thigh (Left) - 46 minutes   DICTATION: .Other Dictation: Dictation Number [id number]  PLAN OF CARE: Admit to inpatient   PATIENT DISPOSITION:  PACU - hemodynamically stable.   Delay start of Pharmacological VTE agent (>24hrs) due to surgical blood loss or risk of bleeding: no

## 2016-08-31 NOTE — Anesthesia Postprocedure Evaluation (Signed)
Anesthesia Post Note  Patient: [name]  Procedure(s) Performed: Procedure(s) (LRB): LEFT TOTAL KNEE ARTHROPLASTY (Left)  Patient location during evaluation: PACU Anesthesia Type: General Level of consciousness: awake and alert Pain management: pain level controlled Respiratory status: spontaneous breathing Cardiovascular status: stable Anesthetic complications: no       Last Vitals:  Vitals:   08/31/16 1615 08/31/16 1630  BP: 107/62 125/71  Pulse: 63 60  Resp: 14 15  Temp:  36.7 C    Last Pain:  Vitals:   08/31/16 1212  TempSrc: Oral                 Kerstie Agent

## 2016-08-31 NOTE — Addendum Note (Signed)
Addendum  created 08/31/16 2002 by Dorris Singhharlene Adolph Clutter, MD   Image imported

## 2016-08-31 NOTE — Anesthesia Procedure Notes (Signed)
Anesthesia Regional Block:  Adductor canal block  Pre-Anesthetic Checklist: ,, timeout performed, Correct Patient, Correct Site, Correct Laterality, Correct Procedure, Correct Position, site marked, Risks and benefits discussed,  Surgical consent,  Pre-op evaluation,  At surgeon's request and post-op pain management  Laterality: Left  Prep: chloraprep       Needles:  Injection technique: Single-shot  Needle Type: Stimulator Needle - 80          Additional Needles:  Procedures: Doppler guided, ultrasound guided (picture in chart) and nerve stimulator Adductor canal block Narrative:  Start time: 08/31/2016 1:15 PM End time: 08/31/2016 1:30 PM Injection made incrementally with aspirations every 5 mL.  Performed by: Personally  Anesthesiologist: Dorris SinghGREEN, Emillee Talsma

## 2016-08-31 NOTE — Anesthesia Preprocedure Evaluation (Signed)
Anesthesia Evaluation  Patient identified by MRN, date of birth, ID band Patient awake    Reviewed: Allergy & Precautions, NPO status , reviewed documented beta blocker date and time   Airway Mallampati: II  TM Distance: >3 FB     Dental   Pulmonary asthma , former smoker,    breath sounds clear to auscultation       Cardiovascular hypertension,  Rhythm:Regular Rate:Normal     Neuro/Psych    GI/Hepatic negative GI ROS, Neg liver ROS,   Endo/Other  negative endocrine ROS  Renal/GU negative Renal ROS     Musculoskeletal  (+) Arthritis ,   Abdominal   Peds  Hematology   Anesthesia Other Findings   Reproductive/Obstetrics                             Anesthesia Physical Anesthesia Plan  ASA: III  Anesthesia Plan: General   Post-op Pain Management:  Regional for Post-op pain   Induction: Intravenous  Airway Management Planned: Oral ETT  Additional Equipment:   Intra-op Plan:   Post-operative Plan:   Informed Consent: I have reviewed the patients History and Physical, chart, labs and discussed the procedure including the risks, benefits and alternatives for the proposed anesthesia with the patient or authorized representative who has indicated his/her understanding and acceptance.   Dental advisory given  Plan Discussed with: CRNA and Anesthesiologist  Anesthesia Plan Comments:         Anesthesia Quick Evaluation

## 2016-08-31 NOTE — Anesthesia Procedure Notes (Signed)
Spinal  Patient location during procedure: OR Start time: 08/31/2016 2:00 PM End time: 08/31/2016 2:04 PM Reason for block: at surgeon's request Staffing Resident/CRNA: Anne Fu Performed: resident/CRNA  Preanesthetic Checklist Completed: patient identified, site marked, surgical consent, pre-op evaluation, timeout performed, IV checked, risks and benefits discussed and monitors and equipment checked Spinal Block Patient position: sitting Prep: DuraPrep Patient monitoring: heart rate, continuous pulse ox and blood pressure Approach: right paramedian Location: L2-3 Injection technique: single-shot Needle Needle type: Pencan  Needle gauge: 24 G Needle length: 9 cm Assessment Sensory level: T6 Additional Notes Expiration date of kit checked and confirmed. Patient tolerated procedure well, without complications. X 1 attempt with noted clear CSF return. Loss of motor and sensory on exam post injection.

## 2016-08-31 NOTE — Transfer of Care (Signed)
Immediate Anesthesia Transfer of Care Note  Patient: [name]  Procedure(s) Performed: Procedure(s): LEFT TOTAL KNEE ARTHROPLASTY (Left)  Patient Location: PACU  Anesthesia Type:Spinal  Level of Consciousness:  sedated, patient cooperative and responds to stimulation  Airway & Oxygen Therapy:Patient Spontanous Breathing and Patient connected to face mask oxgen  Post-op Assessment:  Report given to PACU RN and Post -op Vital signs reviewed and stable  Post vital signs:  Reviewed and stable  Last Vitals:  Vitals:   08/31/16 1332 08/31/16 1333  BP:    Pulse: 70 79  Resp: 14 17  Temp:      Complications: No apparent anesthesia complications

## 2016-08-31 NOTE — Progress Notes (Signed)
Assisted Dr. Green with left, ultrasound guided, adductor canal block. Side rails up, monitors on throughout procedure. See vital signs in flow sheet. Tolerated Procedure well.  

## 2016-08-31 NOTE — H&P (Signed)
TOTAL KNEE ADMISSION H&P  Patient is being admitted for left total knee arthroplasty.  Subjective:  Chief Complaint:left knee pain.  HPI: [name], 66 y.o. female, has a history of pain and functional disability in the left knee due to arthritis and has failed non-surgical conservative treatments for greater than 12 weeks to includeNSAID's and/or analgesics, corticosteriod injections, viscosupplementation injections, use of assistive devices and activity modification.  Onset of symptoms was gradual, starting 3 years ago with gradually worsening course since that time. The patient noted no past surgery on the left knee(s).  Patient currently rates pain in the left knee(s) at 9 out of 10 with activity. Patient has night pain, worsening of pain with activity and weight bearing, pain that interferes with activities of daily living, pain with passive range of motion, crepitus and joint swelling.  Patient has evidence of subchondral sclerosis, periarticular osteophytes and joint space narrowing by imaging studies. There is no active infection.  Patient Active Problem List   Diagnosis Date Noted  . Unilateral primary osteoarthritis, left knee 08/31/2016  . Major neurocognitive disorder 11/27/2015  . Alzheimer's dementia 11/02/2015  . Vertigo 05/02/2015  . Subacute confusional state 05/02/2015  . Acute encephalopathy   . Memory deficit   . Hyperlipidemia   . Prediabetes   . TIA (transient ischemic attack) 04/16/2015   Past Medical History:  Diagnosis Date  . Arthritis   . Asthma    as a child   . Carpal tunnel syndrome    hx of   . Dementia   . Eczema   . High cholesterol   . High cholesterol   . Hypertension   . Psoriasis     Past Surgical History:  Procedure Laterality Date  . HAND SURGERY     Carpal tunnel  . KNEE SURGERY    . SHOULDER SURGERY    . TONSILLECTOMY      No prescriptions prior to admission.   Allergies  Allergen Reactions  . Atorvastatin Other (See  Comments)    Pain   . Ibuprofen Itching and Nausea And Vomiting    dizzy  . Nsaids Other (See Comments)    pain    Social History  Substance Use Topics  . Smoking status: Former Smoker    Quit date: 08/20/2004  . Smokeless tobacco: Never Used  . Alcohol use No    Family History  Problem Relation Age of Onset  . Cancer Father      Review of Systems  Musculoskeletal: Positive for joint pain.  All other systems reviewed and are negative.   Objective:  Physical Exam  Constitutional: She is oriented to person, place, and time. She appears well-developed and well-nourished.  HENT:  Head: Normocephalic and atraumatic.  Eyes: EOM are normal. Pupils are equal, round, and reactive to light.  Neck: Normal range of motion. Neck supple.  Cardiovascular: Normal rate and regular rhythm.   Respiratory: Effort normal and breath sounds normal.  GI: Soft. Bowel sounds are normal.  Musculoskeletal:       Left knee: She exhibits decreased range of motion, swelling and abnormal alignment. Tenderness found. Medial joint line and lateral joint line tenderness noted.  Neurological: She is alert and oriented to person, place, and time.  Skin: Skin is warm and dry.  Psychiatric: She has a normal mood and affect.    Vital signs in last 24 hours:    Labs:   Estimated body mass index is 31.12 kg/m as calculated from the following:  Height as of 08/24/16: 5\' 5"  (1.651 m).   Weight as of 08/24/16: 187 lb (84.8 kg).   Imaging Review Plain radiographs demonstrate severe degenerative joint disease of the left knee(s). The overall alignment ismild valgus. The bone quality appears to be excellent for age and reported activity level.  Assessment/Plan:  End stage arthritis, left knee   The patient history, physical examination, clinical judgment of the provider and imaging studies are consistent with end stage degenerative joint disease of the left knee(s) and total knee arthroplasty is deemed  medically necessary. The treatment options including medical management, injection therapy arthroscopy and arthroplasty were discussed at length. The risks and benefits of total knee arthroplasty were presented and reviewed. The risks due to aseptic loosening, infection, stiffness, patella tracking problems, thromboembolic complications and other imponderables were discussed. The patient acknowledged the explanation, agreed to proceed with the plan and consent was signed. Patient is being admitted for inpatient treatment for surgery, pain control, PT, OT, prophylactic antibiotics, VTE prophylaxis, progressive ambulation and ADL's and discharge planning. The patient is planning to be discharged home with home health services

## 2016-09-01 LAB — CBC
HEMATOCRIT: 34 % — AB (ref 36.0–46.0)
HEMOGLOBIN: 11.3 g/dL — AB (ref 12.0–15.0)
MCH: 29.6 pg (ref 26.0–34.0)
MCHC: 33.2 g/dL (ref 30.0–36.0)
MCV: 89 fL (ref 78.0–100.0)
Platelets: 157 10*3/uL (ref 150–400)
RBC: 3.82 MIL/uL — ABNORMAL LOW (ref 3.87–5.11)
RDW: 13.7 % (ref 11.5–15.5)
WBC: 11.8 10*3/uL — AB (ref 4.0–10.5)

## 2016-09-01 LAB — BASIC METABOLIC PANEL
ANION GAP: 7 (ref 5–15)
BUN: 14 mg/dL (ref 6–20)
CALCIUM: 8.9 mg/dL (ref 8.9–10.3)
CO2: 25 mmol/L (ref 22–32)
CREATININE: 0.52 mg/dL (ref 0.44–1.00)
Chloride: 104 mmol/L (ref 101–111)
GFR calc non Af Amer: >60 mL/min (ref >60)
Glucose, Bld: 142 mg/dL — ABNORMAL HIGH (ref 65–99)
Potassium: 4 mmol/L (ref 3.5–5.1)
SODIUM: 136 mmol/L (ref 135–145)

## 2016-09-01 MED ORDER — ASPIRIN 81 MG PO CHEW
81.0000 mg | CHEWABLE_TABLET | Freq: Two times a day (BID) | ORAL | Status: DC
  Administered 2016-09-02 – 2016-09-03 (×3): 81 mg via ORAL
  Filled 2016-09-01 (×3): qty 1

## 2016-09-01 MED ORDER — ASPIRIN 81 MG PO CHEW: 81.0000 mg | CHEWABLE_TABLET | Freq: Two times a day (BID) | ORAL | Status: DC

## 2016-09-01 MED ORDER — RIVASTIGMINE 9.5 MG/24HR TD PT24
9.5000 mg | MEDICATED_PATCH | Freq: Every day | TRANSDERMAL | Status: DC
  Administered 2016-09-01 – 2016-09-03 (×3): 9.5 mg via TRANSDERMAL
  Filled 2016-09-01 (×3): qty 1

## 2016-09-01 NOTE — Op Note (Signed)
[name], [name]               ACCOUNT NO.:  [id number]  MEDICAL RECORD NO.:  [id number]  LOCATION:                                 FACILITY:  PHYSICIAN:  Vanita Panda. Magnus Ivan, M.D.DATE OF BIRTH:  Aug 04, 1951  DATE OF PROCEDURE:  08/31/2016 DATE OF DISCHARGE:                              OPERATIVE REPORT   PREOPERATIVE DIAGNOSES:  Primary osteoarthritis and degenerative joint disease, left knee.  POSTOPERATIVE DIAGNOSIS:  Primary osteoarthritis and degenerative joint disease, left knee.  PROCEDURE:  Left total knee arthroplasty.  IMPLANTS:  Stryker Triathlon knee with size 3 femur, size 3 tibial tray, 9 mm fix-bearing polyethylene insert, size 29 patellar button.  SURGEON:  Vanita Panda. Magnus Ivan, M.D.  ASSISTANT:  Richardean Canal, PA-C.  ANESTHESIA: 1. Left lower extremity adductor canal block. 2. Spinal. 3. Mask ventilation IV sedation.  ANTIBIOTICS:  2 g of IV Ancef.  TOURNIQUET TIME:  Just over 1 hour.  ESTIMATED BLOOD LOSS:  Less than 100 mL.  COMPLICATIONS:  None.  INDICATIONS:  [name] is a 66 year old female with some mild Alzheimer's dementia, but debilitating pain involving her left knee. She has a slight valgus deformity of this knee with really bone-on-bone wear on the lateral compartment of the knee.  Her pain is quite severe and it is definitely affected her activities of daily living, her mobility, and her quality of life.  I have talked to her and her husband at length and she does wish to proceed with a total knee replacement given the amount of pain she is having in the knee and the severity of it.  She understands and certainly surgery is painful and there is risk of acute blood loss anemia, nerve and vessel injury, fracture, infection, and DVT.  She understands our goals are to decreased pain, improved mobility, and overall improved quality of life.  PROCEDURE DESCRIPTION:  After informed consent was obtained, appropriate left knee was  marked.  Anesthesia obtained with regional left lower extremity adductor canal block.  Spinal anesthesia was then obtained where she was brought to the room.  She was laid supine on the operating table and a Foley catheter was placed.  A nonsterile tourniquet was placed around her upper left thigh.  Her left leg was then prepped and draped with DuraPrep and sterile drapes.  Time-out was called to identify correct patient, correct left knee.  We then used Esmarch to wrap out the leg and tourniquet was inflated to 300 mm of pressure.  We then made a direct midline incision over the patella and carried this proximally and distally.  We dissected down the knee joint, carried out a medial parapatellar arthrotomy.  We found a moderate joint effusion. We found really a valgus deformity with significant cartilage loss on the lateral aspect of her knee only.  There was some patellofemoral disease as well.  We then removed the remnants of the ACL, PCL, medial and lateral meniscus and with the knee in a flexed position, we set the extramedullary cutting guide to take 2 mm off the low side and 9 mm off the high side.  We corrected for neck neutral slope in varus and valgus, and we made  our tibial cut without difficulty.  We then used the intramedullary guide through the notch of the femur, setting our distal femoral cut at 8 mm distal femoral cut setting this for 5 degrees externally rotated.  We made our distal femoral cut without difficulty and brought the knee back down in the extension.  With a 9 mm extension block, we achieved full extension.  We then went back to the femur and put our femoral sizing guide based off the epicondylar axis in 3 degrees left, we chose a size 3 femur.  We put a 4 in 1 cutting block for a size 3 femur and made our anterior and posterior cuts, followed by our chamfer cuts.  We then made our femoral box cut.  Attention was then turned back to the tibia.  We set our  tibial rotation off the tibial tubercle and the femur.  We were able to chose a size 3 tibial tray.  We did our keel punch off this.  With the trial size 3 tibial tray and a size 3 femur, we trialed a 9 mm fix-bearing polyethylene insert for a size 3 tibia and we brought the knee back to range of motion, it was stable.  We put the knee through range of motion.  It was stable with the trial implants.  We then made our patellar button cut as it is likely a size 29 patellar button.  With all trial components and again I was pleased with stability and range of motion.  We then removed all trial components and irrigated the knee with normal saline solution.  We mixed our cement and cemented the real Stryker Triathlon tibial tray size 3, followed by the real size 3 femur.  We placed a 9 mm polyethylene insert and cemented our patellar button.  Once the cement had hardened, we removed the cement debris from the knee.  Hemostasis was obtained electrocautery.  Once the tourniquet was let down, we irrigated the knee again with normal saline solution.  We then closed the arthrotomy with interrupted #1 Vicryl suture followed by 0 Vicryl in the deep tissue, 2-0 Vicryl subcutaneous tissue, 4-0 Monocryl subcuticular stitch and Steri-Strips on the skin.  Well-padded sterile dressing was applied.  She was taken to the recovery room in stable condition.  All final counts were correct.  There were no complications noted.  Of note, Richardean CanalGilbert Clark, PA-C assisted in the case.  His assistance was crucial for facilitating almost all aspects of this case.     Vanita Pandahristopher Y. Magnus IvanBlackman, M.D.   ______________________________ Vanita Pandahristopher Y. Magnus IvanBlackman, M.D.    CYB/MEDQ  D:  08/31/2016  T:  09/01/2016  Job:  [id number]

## 2016-09-01 NOTE — Progress Notes (Signed)
PT Cancellation Note  Patient Details Name: Dannielle Karvonenhyllis D Crockett MRN: [id number] DOB: November 13, 1950   Cancelled Treatment:    Reason Eval/Treat Not Completed: Deferred 2nd session this afternoon. Pt attempted to get up unassisted and pulled out IV. Pt was confused and very drowsy this am and that has not really improved much. Will resume PT on tomorrow. Thanks.    Rebeca AlertJannie Tian Davison, MPT Pager: [phone]

## 2016-09-01 NOTE — Progress Notes (Signed)
Subjective: H is doing reasonably well.  Pain is controlled.  She has been on the CPM machine.   Objective: Vital signs in last 24 hours: Temp:  [97.3 F (36.3 C)-98.3 F (36.8 C)] 97.8 F (36.6 C) (01/13 1000) Pulse Rate:  [60-79] 73 (01/13 1000) Resp:  [10-21] 15 (01/13 1000) BP: (90-154)/(62-90) 129/74 (01/13 1000) SpO2:  [91 %-100 %] 94 % (01/13 1000) Weight:  [187 lb (84.8 kg)] 187 lb (84.8 kg) (01/12 1207)  Intake/Output from previous day: 01/12 0701 - 01/13 0700 In: 3745 [P.O.:540; I.V.:3045; IV Piggyback:160] Out: 2125 [Urine:2025; Blood:100] Intake/Output this shift: Total I/O In: 152.5 [P.O.:120; I.V.:32.5] Out: -   Exam:  Sensation intact distally Dorsiflexion/Plantar flexion intact  Labs:  Recent Labs  09/01/16 0516  HGB 11.3*    Recent Labs  09/01/16 0516  WBC 11.8*  RBC 3.82*  HCT 34.0*  PLT 157    Recent Labs  09/01/16 0516  NA 136  K 4.0  CL 104  CO2 25  BUN 14  CREATININE 0.52  GLUCOSE 142*  CALCIUM 8.9   No results for input(s): LABPT, INR in the last 72 hours.  Assessment/Plan: To continue physical therapy and CPM machine today.  She will likely be ready for discharge Monday or Tuesday of next week   G Dorene GrebeScott Sun Kihn 09/01/2016, 10:52 AM

## 2016-09-01 NOTE — Progress Notes (Signed)
Patient ID: [name], female   DOB: 03/08/1951, 66 y.o.   MRN: [id number] Due to bleeding around the knee, I will stop the Xarelto and just have her on 81 mg aspirin twice daily (she take one daily at home).

## 2016-09-01 NOTE — Op Note (Deleted)
  The note originally documented on this encounter has been moved the the encounter in which it belongs.  

## 2016-09-01 NOTE — Evaluation (Signed)
Physical Therapy Evaluation Patient Details Name: [name] MRN: [id number] DOB: 1951/07/25 Today's Date: 09/01/2016   History of Present Illness  66 yo female s/p L TKA 08/31/16.  Clinical Impression  On eval, pt require Min assist +2 for safety. Pt c/o mod-severe pain with mobility. She was only able to walk ~10 feet with a RW. Distance limited by pain, drowsiness, dizziness. RN reports pt had received a lot of pain meds this am. Will follow and progress activity as tolerated.     Follow Up Recommendations Home health PT;Supervision/Assistance - 24 hour    Equipment Recommendations   (need to confirm DME once pt is more alert)    Recommendations for Other Services OT consult     Precautions / Restrictions Precautions Precautions: Fall Required Braces or Orthoses: Knee Immobilizer - Left Knee Immobilizer - Left: Discontinue once straight leg raise with < 10 degree lag Restrictions Weight Bearing Restrictions: No LLE Weight Bearing: Weight bearing as tolerated      Mobility  Bed Mobility Overal bed mobility: Needs Assistance Bed Mobility: Supine to Sit     Supine to sit: Min assist;HOB elevated     General bed mobility comments: Assist for L LE. Increased time. Multimodal cueing required.   Transfers Overall transfer level: Needs assistance Equipment used: Rolling walker (2 wheeled) Transfers: Sit to/from Stand Sit to Stand: Min assist;From elevated surface         General transfer comment: Assist to rise, stabilize, control descent. Multimodal cues required. Cues for safety, technique, hand/LE placement. Increased time.   Ambulation/Gait Ambulation/Gait assistance: Min assist;+2 safety/equipment Ambulation Distance (Feet): 10 Feet Assistive device: Rolling walker (2 wheeled) Gait Pattern/deviations: Step-to pattern;Antalgic     General Gait Details: Multimodal and repeated cues for sequence, safety. Slow gait speed. Pt c/o dizziness/ "about to pass  out" so brought recliner out and rolled pt back to room.   Stairs            Wheelchair Mobility    Modified Rankin (Stroke Patients Only)       Balance                                             Pertinent Vitals/Pain Pain Assessment: Faces Faces Pain Scale: Hurts even more Pain Location: L knee Pain Intervention(s): Limited activity within patient's tolerance;Repositioned;Premedicated before session    Home Living Family/patient expects to be discharged to:: Private residence Living Arrangements: Spouse/significant other Available Help at Discharge: Family Type of Home: House Home Access: Stairs to enter Entrance Stairs-Rails: Right Entrance Stairs-Number of Steps: 2-4   Home Equipment: Environmental consultantWalker - 2 wheels      Prior Function Level of Independence: Independent               Hand Dominance        Extremity/Trunk Assessment   Upper Extremity Assessment Upper Extremity Assessment: Defer to OT evaluation    Lower Extremity Assessment Lower Extremity Assessment: Generalized weakness (s/p L TKA)    Cervical / Trunk Assessment Cervical / Trunk Assessment: Normal  Communication   Communication: No difficulties  Cognition Arousal/Alertness: Lethargic;Suspect due to medications Behavior During Therapy: [facility] for tasks assessed/performed Overall Cognitive Status: Difficult to assess                      General Comments      Exercises  Total Joint Exercises Ankle Circles/Pumps: AROM;Both;10 reps;Supine Heel Slides: AAROM;Left;10 reps;Supine Hip ABduction/ADduction: AAROM;Left;10 reps;Supine Straight Leg Raises: AAROM;Left;10 reps;Supine Goniometric ROM: ~10-30 degrees (limited by pain)   Assessment/Plan    PT Assessment Patient needs continued PT services  PT Problem List Decreased strength;Decreased mobility;Decreased range of motion;Decreased activity tolerance;Decreased balance;Decreased knowledge of use of  DME;Pain;Decreased cognition          PT Treatment Interventions DME instruction;Therapeutic activities;Gait training;Therapeutic exercise;Patient/family education;Functional mobility training;Stair training;Balance training    PT Goals (Current goals can be found in the Care Plan section)  Acute Rehab PT Goals Patient Stated Goal: less pain PT Goal Formulation: With patient Time For Goal Achievement: 09/15/16 Potential to Achieve Goals: Good    Frequency 7X/week   Barriers to discharge        Co-evaluation               End of Session Equipment Utilized During Treatment: Gait belt;Left knee immobilizer Activity Tolerance: Patient limited by pain (Limited by dizziness/drowsiness) Patient left: in bed;with call bell/phone within reach;with chair alarm set           Time: 8119-1478 PT Time Calculation (min) (ACUTE ONLY): 31 min   Charges:   PT Evaluation $PT Eval Low Complexity: 1 Procedure PT Treatments $Gait Training: 8-22 mins   PT G Codes:        Rebeca Alert, MPT Pager: [phone]

## 2016-09-01 NOTE — Progress Notes (Signed)
OT Cancellation Note  Patient Details Name: [name] MRN: [id number] DOB: 11-21-1950   Cancelled Treatment:    Reason Eval/Treat Not Completed: Other (comment).  Pt was willing to work with OT but is now limited by nausea. Will check back.  [name] 09/01/2016, 11:15 AM  [name], OTR/L [phone] 09/01/2016

## 2016-09-02 NOTE — Progress Notes (Signed)
Physical Therapy Treatment Patient Details Name: [name] MRN: [id number] DOB: 16-Oct-1950 Today's Date: 09/02/2016    History of Present Illness 66 yo female s/p L TKA 08/31/16.    PT Comments    Pt progressing well with mobility. She is less lethargic now. She ambulated 100' with RW and min/guard assist, and performed TKA exercises with min A.  Will plan to do stair training tomorrow, expect she'll be ready to DC home tomorrow from a mobility standpoint.    Follow Up Recommendations  Home health PT;Supervision/Assistance - 24 hour     Equipment Recommendations  3in1 (PT) (family confirms they need a 3 in 1)    Recommendations for Other Services OT consult     Precautions / Restrictions Precautions Precautions: Fall Required Braces or Orthoses: Knee Immobilizer - Left Knee Immobilizer - Left: Discontinue once straight leg raise with < 10 degree lag Restrictions LLE Weight Bearing: Weight bearing as tolerated    Mobility  Bed Mobility Overal bed mobility: Needs Assistance Bed Mobility: Supine to Sit     Supine to sit: HOB elevated;Supervision     General bed mobility comments: instructed to self Assist L LE with RLE. Increased time. Multimodal cueing required.   Transfers Overall transfer level: Needs assistance Equipment used: Rolling walker (2 wheeled) Transfers: Sit to/from Stand Sit to Stand: Min guard         General transfer comment: Cues for safety, technique, hand/LE placement.   Ambulation/Gait Ambulation/Gait assistance: Min guard Ambulation Distance (Feet): 100 Feet Assistive device: Rolling walker (2 wheeled) Gait Pattern/deviations: Step-to pattern;Antalgic   Gait velocity interpretation: Below normal speed for age/gender General Gait Details: verbal cues for posture and  positioning in RW, no LOB, good sequencing   Stairs            Wheelchair Mobility    Modified Rankin (Stroke Patients Only)       Balance Overall  balance assessment: Needs assistance   Sitting balance-Leahy Scale: Good       Standing balance-Leahy Scale: Poor Standing balance comment: relies on BUE support                    Cognition Arousal/Alertness: Awake/alert Behavior During Therapy: WFL for tasks assessed/performed Overall Cognitive Status: Within Functional Limits for tasks assessed                      Exercises Total Joint Exercises Ankle Circles/Pumps: AROM;Both;10 reps;Supine Short Arc Quad: AAROM;Left;10 reps;Supine Heel Slides: AAROM;Left;10 reps;Supine Hip ABduction/ADduction: AAROM;Left;10 reps;Supine Straight Leg Raises: AAROM;Left;10 reps;Supine Goniometric ROM: 5-40* AAROM L knee    General Comments        Pertinent Vitals/Pain Pain Score: 7  Pain Location: L knee Pain Descriptors / Indicators: Sore Pain Intervention(s): Limited activity within patient's tolerance;Monitored during session;Ice applied    Home Living                      Prior Function            PT Goals (current goals can now be found in the care plan section) Acute Rehab PT Goals Patient Stated Goal: less pain PT Goal Formulation: With patient/family Time For Goal Achievement: 09/15/16 Potential to Achieve Goals: Good Progress towards PT goals: Progressing toward goals    Frequency    7X/week      PT Plan Current plan remains appropriate    Co-evaluation  End of Session Equipment Utilized During Treatment: Gait belt;Left knee immobilizer Activity Tolerance: Patient limited by pain (Limited by dizziness/drowsiness) Patient left: with call bell/phone within reach;with chair alarm set;in chair;with family/visitor present     Time: 1610-9604 PT Time Calculation (min) (ACUTE ONLY): 35 min  Charges:  $Gait Training: 8-22 mins $Therapeutic Exercise: 8-22 mins                    G Codes:      Tamala Ser 09/02/2016, 1:24 PM  [phone]

## 2016-09-02 NOTE — Clinical Social Work Note (Signed)
AC informed CSW that a family member of patient wanted to discuss placement for the patient. CSW contacted unit and asked that they inform the daughter that CSW has added the patient to our list of consults and that we will discuss placement with the family/patient when time allows. CSW notes that PT recommendations are NOT for SNF, and given the patient's insurance, the patient will likely not qualify for SNF if HHPT continues to be the recommendation.   Roddie McBryant Vianny Schraeder MSW, [place]LCSW, [place], [phone]

## 2016-09-02 NOTE — Progress Notes (Signed)
Subjective: Patient stable.  Pain control.  She is moving better today than yesterday   Objective: Vital signs in last 24 hours: Temp:  [97.7 F (36.5 C)-98.9 F (37.2 C)] 97.7 F (36.5 C) (01/14 0517) Pulse Rate:  [80-87] 87 (01/14 0517) Resp:  [16] 16 (01/14 0517) BP: (147-166)/(66-84) 149/66 (01/14 0517) SpO2:  [96 %-99 %] 96 % (01/14 0517)  Intake/Output from previous day: 01/13 0701 - 01/14 0700 In: 763.8 [P.O.:300; I.V.:463.8] Out: 902 [Urine:900; Emesis/NG output:2] Intake/Output this shift: Total I/O In: 240 [P.O.:240] Out: -   Exam:  Dorsiflexion/Plantar flexion intact  Labs:  Recent Labs  09/01/16 0516  HGB 11.3*    Recent Labs  09/01/16 0516  WBC 11.8*  RBC 3.82*  HCT 34.0*  PLT 157    Recent Labs  09/01/16 0516  NA 136  K 4.0  CL 104  CO2 25  BUN 14  CREATININE 0.52  GLUCOSE 142*  CALCIUM 8.9   No results for input(s): LABPT, INR in the last 72 hours.  Assessment/Plan: Plan for rehabilitation today and the decision tomorrow will be for discharge to home versus discharge to skilled nursing.  Patient prefers discharge to home.   Marrianne MoodG Scott Dean 09/02/2016, 10:49 AM

## 2016-09-02 NOTE — Evaluation (Signed)
Occupational Therapy Evaluation Patient Details Name: [name] MRN: [id number] DOB: March 11, 1951 Today's Date: 09/02/2016    History of Present Illness 66 yo female s/p L TKA 08/31/16, h/o Alzheimer's/memory disorder   Clinical Impression   Pt was admitted for the above sx. She will benefit from continued OT.  Unsure if pt needed any assistance prior to admission.  She currently needs up to max A for ADLs.  Pt was sleepy during session but rearoused with cues. Goals are for min guard level    Follow Up Recommendations  Supervision/Assistance - 24 hour    Equipment Recommendations  3 in 1 bedside commode (if she doesn't have)    Recommendations for Other Services       Precautions / Restrictions Precautions Precautions: Fall Required Braces or Orthoses: Knee Immobilizer - Left Knee Immobilizer - Left: Discontinue once straight leg raise with < 10 degree lag Restrictions Weight Bearing Restrictions: No LLE Weight Bearing: Weight bearing as tolerated      Mobility Bed Mobility Overal bed mobility: Needs Assistance Bed Mobility: Supine to Sit     Supine to sit: Min assist;HOB elevated     General bed mobility comments: oob  Transfers Overall transfer level: Needs assistance Equipment used: Rolling walker (2 wheeled) Transfers: Sit to/from Stand Sit to Stand: Min assist         General transfer comment: assist to rise and stabilize. Cues for UE/LE placement    Balance Overall balance assessment: Needs assistance   Sitting balance-Leahy Scale: Good       Standing balance-Leahy Scale: Poor Standing balance comment: relies on BUE support                            ADL Overall ADL's : Needs assistance/impaired     Grooming: Set up;Sitting;Oral care   Upper Body Bathing: Set up;Sitting;Supervision/ safety   Lower Body Bathing: Moderate assistance;Sit to/from stand   Upper Body Dressing : Minimal assistance;Sitting   Lower Body  Dressing: Maximal assistance;Sit to/from stand                 General ADL Comments: performed ADL from recliner, sit to stand. Could not release a hand in standing during adls Pt very fatiqued--performed oral care from sitting.  Pt is unsure what husband got her for DME.  She knows she has a Optometrist      Pertinent Vitals/Pain Pain Assessment: Faces Pain Score: 5  Faces Pain Scale: Hurts even more Pain Location: L knee Pain Descriptors / Indicators: Sore Pain Intervention(s): Limited activity within patient's tolerance;Monitored during session;Premedicated before session;Repositioned     Hand Dominance Left   Extremity/Trunk Assessment             Communication Communication Communication: No difficulties   Cognition Arousal/Alertness: Lethargic Behavior During Therapy: WFL for tasks assessed/performed Overall Cognitive Status: No family/caregiver present to determine baseline cognitive functioning                 General Comments: rearouses with cues; some difficulty processing, sometimes extra time for commands   General Comments       Exercises       Shoulder Instructions      Home Living Family/patient expects to be discharged to:: Private residence Living Arrangements: Spouse/significant other           Home Layout: Two  level     Bathroom Shower/Tub: Producer, television/film/videoWalk-in shower   Bathroom Toilet: Standard     Home Equipment: Environmental consultantWalker - 2 wheels   Additional Comments: husband home for 10 days per pt. Daughter lives nearby      Prior Functioning/Environment Level of Independence: Independent                 OT Problem List: Decreased strength;Decreased activity tolerance;Decreased cognition;Decreased knowledge of use of DME or AE;Pain   OT Treatment/Interventions:      OT Goals(Current goals can be found in the care plan section) Acute Rehab OT Goals Patient Stated Goal: less pain OT Goal  Formulation: With patient Time For Goal Achievement: 09/05/16 Potential to Achieve Goals: Good ADL Goals Pt Will Perform Grooming: with min guard assist;standing Pt Will Transfer to Toilet: with min guard assist;ambulating;bedside commode Pt Will Perform Toileting - Clothing Manipulation and hygiene: with min guard assist;sit to/from stand Pt Will Perform Tub/Shower Transfer: Shower transfer;with min guard assist;ambulating;shower seat  OT Frequency: Min 2X/week   Barriers to D/C:            Co-evaluation              End of Session    Activity Tolerance: Patient tolerated treatment well Patient left: in chair;with call bell/phone within reach;with chair alarm set   Time: [id number] OT Time Calculation (min): 24 min Charges:  OT General Charges $OT Visit: 1 Procedure OT Evaluation $OT Eval Low Complexity: 1 Procedure OT Treatments $Self Care/Home Management : 8-22 mins G-Codes:    Keslyn Teater 09/02/2016, 9:26 AM  Marica OtterMaryellen Ching [place]au, OTR/L [phone] 09/02/2016

## 2016-09-02 NOTE — Progress Notes (Addendum)
Physical Therapy Treatment Patient Details Name: [name] MRN: [id number] DOB: 1950/11/20 Today's Date: 09/02/2016    History of Present Illness 66 yo female s/p L TKA 08/31/16.    PT Comments    Pt tolerated increased distance of 20' ambulating, limited by dizziness and pain. Performed TKA exercises with assistance. Pt had difficulty performing quad sets. She is somewhat lethargic, possibly due to pain meds.    Follow Up Recommendations  Home health PT;Supervision/Assistance - 24 hour     Equipment Recommendations  None recommended by PT (need confirm DME once pt is more alert)    Recommendations for Other Services OT consult     Precautions / Restrictions Precautions Precautions: Fall Required Braces or Orthoses: Knee Immobilizer - Left Restrictions Weight Bearing Restrictions: No LLE Weight Bearing: Weight bearing as tolerated    Mobility  Bed Mobility Overal bed mobility: Needs Assistance Bed Mobility: Supine to Sit     Supine to sit: Min assist;HOB elevated     General bed mobility comments: instructed to self Assist  L LE with RLE. Increased time. Multimodal cueing required.   Transfers Overall transfer level: Needs assistance Equipment used: Rolling walker (2 wheeled) Transfers: Sit to/from Stand Sit to Stand: Min assist;From elevated surface         General transfer comment: Assist to rise, stabilize, control descent. Multimodal cues required. Cues for safety, technique, hand/LE placement. Increased time.   Ambulation/Gait Ambulation/Gait assistance: Min assist Ambulation Distance (Feet): 20 Feet Assistive device: Rolling walker (2 wheeled) Gait Pattern/deviations: Step-to pattern;Antalgic   Gait velocity interpretation: Below normal speed for age/gender General Gait Details: Multimodal and repeated cues for sequence, safety, positioning in RW. Slow gait speed. Distance limited by dizziness.    Stairs            Wheelchair  Mobility    Modified Rankin (Stroke Patients Only)       Balance Overall balance assessment: Needs assistance   Sitting balance-Leahy Scale: Good       Standing balance-Leahy Scale: Poor Standing balance comment: relies on BUE support                    Cognition Arousal/Alertness: Lethargic;Suspect due to medications Behavior During Therapy: [facility] for tasks assessed/performed Overall Cognitive Status: No family/caregiver present to determine baseline cognitive functioning                 General Comments: arouses to verbal stimulii, falls back asleep quickly, some difficulty following directions during ther ex, couldn't recall what DME she has    Exercises Total Joint Exercises Ankle Circles/Pumps: AROM;Both;10 reps;Supine Quad Sets:  (attempted quad sets, pt unable to perform correctly with multiple verbal/manual cues) Short Arc Quad: AAROM;Left;10 reps;Supine Heel Slides: AAROM;Left;10 reps;Supine Hip ABduction/ADduction: AAROM;Left;10 reps;Supine Straight Leg Raises: AAROM;Left;10 reps;Supine Goniometric ROM: 5-35* AAROM L knee limited by pain    General Comments        Pertinent Vitals/Pain Pain Score: 5  Pain Location: L knee Pain Descriptors / Indicators: Sore Pain Intervention(s): Limited activity within patient's tolerance;Monitored during session;Premedicated before session;Ice applied    Home Living Family/patient expects to be discharged to:: Private residence Living Arrangements: Spouse/significant other         Home Layout: Two level Home Equipment: Environmental consultant - 2 wheels      Prior Function Level of Independence: Independent          PT Goals (current goals can now be found in the care plan section) Acute Rehab  PT Goals Patient Stated Goal: less pain PT Goal Formulation: With patient Time For Goal Achievement: 09/15/16 Potential to Achieve Goals: Good Progress towards PT goals: Progressing toward goals    Frequency     7X/week      PT Plan Current plan remains appropriate    Co-evaluation             End of Session Equipment Utilized During Treatment: Gait belt;Left knee immobilizer Activity Tolerance: Patient limited by pain (Limited by dizziness/drowsiness) Patient left: with call bell/phone within reach;with chair alarm set;in chair     Time: [id number] PT Time Calculation (min) (ACUTE ONLY): 24 min  Charges:  $Gait Training: 8-22 mins $Therapeutic Exercise: 8-22 mins                    G Codes:      Tamala SerUhlenberg, Negin Hegg Kistler 09/02/2016, 8:35 AM [phone]

## 2016-09-02 NOTE — Care Management Note (Signed)
Case Management Note  Patient Details  Name: [name] Tabbert MRN: [id number] Date of Birth: [birth year]  Subjective/Objective:                  LEFT TOTAL KNEE ARTHROPLASTY (Left)  Action/Plan: CM spoke with the patient at the bedside. PT with [facility] was arranged pre-operatively. Patient is agreeable. Patient reports she has a RW and 3N1 at home. She lives with her husband and reports he is able to provide assistance at home when she is discharged. Reports no issues with getting to her appointments or affording medications.   Expected Discharge Date:  09/03/16               Expected Discharge Plan:  Home w Home Health Services  In-House Referral:  NA  Discharge planning Services  CM Consult  Post Acute Care Choice:  Home Health Choice offered to:  Patient  DME Arranged:  N/A DME Agency:  NA  HH Arranged:  PT HH Agency:  [facility])  Status of Service:  Completed, signed off  If discussed at [employer] Length of Stay Meetings, dates discussed:    Additional Comments:  Antony HasteBennett, Abdurahman Rugg Harris, RN 09/02/2016, 9:35 AM

## 2016-09-03 ENCOUNTER — Encounter (HOSPITAL_COMMUNITY): Payer: Self-pay | Admitting: Orthopaedic Surgery

## 2016-09-03 MED ORDER — ASPIRIN 81 MG PO CHEW: 81.0000 mg | CHEWABLE_TABLET | Freq: Two times a day (BID) | ORAL | 0 refills | Status: AC

## 2016-09-03 MED ORDER — OXYCODONE-ACETAMINOPHEN 5-325 MG PO TABS: 1.0000 | ORAL_TABLET | ORAL | 0 refills | Status: DC | PRN

## 2016-09-03 NOTE — Progress Notes (Signed)
Occupational Therapy Treatment Patient Details Name: [name] MRN: [id number] DOB: 1951/03/03 Today's Date: 09/03/2016    History of present illness 66 yo female s/p L TKA 08/31/16.  h/o Dementia    OT comments  Pt requires min guard assist and mod cues for tub/shower transfer and toilet transfer.  Pt with decreased recall of info (h/o dementia).  Spouse reports he will be available to assist her as needed at discharge.   Follow Up Recommendations  Home health OT;Supervision/Assistance - 24 hour    Equipment Recommendations  3 in 1 bedside commode    Recommendations for Other Services      Precautions / Restrictions Precautions Precautions: Fall Required Braces or Orthoses: Knee Immobilizer - Left Knee Immobilizer - Left: Discontinue once straight leg raise with < 10 degree lag Restrictions LLE Weight Bearing: Weight bearing as tolerated       Mobility Bed Mobility Overal bed mobility: Needs Assistance Bed Mobility: Supine to Sit     Supine to sit: Supervision     General bed mobility comments: cues for pt to self assist L LE (pt utilized UE assist technique)  Transfers Overall transfer level: Needs assistance Equipment used: Rolling walker (2 wheeled) Transfers: Sit to/from UGI CorporationStand;Stand Pivot Transfers Sit to Stand: Min guard Stand pivot transfers: Min guard       General transfer comment: Cues for safety, technique, hand/LE placement.     Balance Overall balance assessment: Needs assistance Sitting-balance support: Feet supported Sitting balance-Leahy Scale: Good     Standing balance support: During functional activity;Bilateral upper extremity supported Standing balance-Leahy Scale: Poor Standing balance comment: reliant on UE support                    ADL Overall ADL's : Needs assistance/impaired                         Toilet Transfer: Min guard;Ambulation;Comfort height toilet;BSC;RW   Toileting- ArchitectClothing Manipulation and  Hygiene: Min guard;Sit to/from stand   Tub/ Shower Transfer: Min guard;Ambulation;Shower Dealerseat;Rolling walker Tub/Shower Transfer Details (indicate cue type and reason): Pt required mod cues for technique due to difficulty recalling instructions.  Spouse reports he will be home with her and able to assist her as needed  Functional mobility during ADLs: Min guard;Rolling walker        Vision                     Perception     Praxis      Cognition   Behavior During Therapy: WFL for tasks assessed/performed Overall Cognitive Status: History of cognitive impairments - at baseline                  General Comments: requires cues for recall of information     Extremity/Trunk Assessment               Exercises     Shoulder Instructions       General Comments      Pertinent Vitals/ Pain       Pain Assessment: Faces Faces Pain Scale: Hurts even more Pain Location: L knee Pain Descriptors / Indicators: Aching;Grimacing;Guarding Pain Intervention(s): Monitored during session;Repositioned;Ice applied;Patient requesting pain meds-RN notified  Home Living  Prior Functioning/Environment              Frequency  Min 2X/week        Progress Toward Goals  OT Goals(current goals can now be found in the care plan section)  Progress towards OT goals: Progressing toward goals     Plan Discharge plan needs to be updated    Co-evaluation                 End of Session Equipment Utilized During Treatment: Rolling walker;Left knee immobilizer   Activity Tolerance Patient tolerated treatment well   Patient Left in chair;with call bell/phone within reach;with family/visitor present   Nurse Communication Mobility status;Patient requests pain meds        Time: 1610-9604 OT Time Calculation (min): 14 min  Charges: OT General Charges $OT Visit: 1 Procedure OT Treatments $Self  Care/Home Management : 8-22 mins  [name] 09/03/2016, 1:37 PM

## 2016-09-03 NOTE — Discharge Summary (Signed)
Patient ID: [name] MRN: [id number] DOB/AGE: 25-Mar-1951 66 y.o.  Admit date: 08/31/2016 Discharge date: 09/03/2016  Admission Diagnoses:  Principal Problem:   Unilateral primary osteoarthritis, left knee Active Problems:   Status post total left knee replacement   Discharge Diagnoses:  Same  Past Medical History:  Diagnosis Date  . Arthritis   . Asthma    as a child   . Carpal tunnel syndrome    hx of   . Dementia   . Eczema   . High cholesterol   . High cholesterol   . Hypertension   . Psoriasis     Surgeries: Procedure(s): LEFT TOTAL KNEE ARTHROPLASTY on 08/31/2016   Consultants:   Discharged Condition: Improved  Hospital Course: [name] is an 66 y.o. female who was admitted 08/31/2016 for operative treatment ofUnilateral primary osteoarthritis, left knee. Patient has severe unremitting pain that affects sleep, daily activities, and work/hobbies. After pre-op clearance the patient was taken to the operating room on 08/31/2016 and underwent  Procedure(s): LEFT TOTAL KNEE ARTHROPLASTY.    Patient was given perioperative antibiotics: Anti-infectives    Start     Dose/Rate Route Frequency Ordered Stop   08/31/16 2000  ceFAZolin (ANCEF) IVPB 1 g/50 mL premix     1 g 100 mL/hr over 30 Minutes Intravenous Every 6 hours 08/31/16 1704 09/01/16 0243   08/31/16 1158  ceFAZolin (ANCEF) IVPB 2g/100 mL premix     2 g 200 mL/hr over 30 Minutes Intravenous On call to O.R. 08/31/16 1158 08/31/16 1411       Patient was given sequential compression devices, early ambulation, and chemoprophylaxis to prevent DVT.  Patient benefited maximally from hospital stay and there were no complications.    Recent vital signs: Patient Vitals for the past 24 hrs:  BP Temp Temp src Pulse Resp SpO2  09/03/16 0620 (!) 157/76 98.7 F (37.1 C) Oral 92 18 93 %  09/02/16 2300 (!) 106/58 98.8 F (37.1 C) Oral 66 18 100 %  09/02/16 1345 (!) 116/55 98 F (36.7 C) Oral 84 18 97 %     Recent laboratory studies:  Recent Labs  09/01/16 0516  WBC 11.8*  HGB 11.3*  HCT 34.0*  PLT 157  NA 136  K 4.0  CL 104  CO2 25  BUN 14  CREATININE 0.52  GLUCOSE 142*  CALCIUM 8.9     Discharge Medications:   Allergies as of 09/03/2016      Reactions   Atorvastatin Other (See Comments)   Pain    Ibuprofen Itching, Nausea And Vomiting   dizzy   Nsaids Other (See Comments)   pain      Medication List    STOP taking these medications   aspirin EC 81 MG tablet Replaced by:  aspirin 81 MG chewable tablet     TAKE these medications   aspirin 81 MG chewable tablet Chew 1 tablet (81 mg total) by mouth 2 (two) times daily after a meal. Replaces:  aspirin EC 81 MG tablet   losartan 50 MG tablet Commonly known as:  COZAAR Take 50 mg by mouth daily.   oxyCODONE-acetaminophen 5-325 MG tablet Commonly known as:  ROXICET Take 1-2 tablets by mouth every 4 (four) hours as needed.   rivastigmine 9.5 mg/24hr Commonly known as:  EXELON Place 1 patch onto the skin daily.   rosuvastatin 20 MG tablet Commonly known as:  CRESTOR Take 20 mg by mouth daily.  Durable Medical Equipment        Start     Ordered   08/31/16 1705  DME Walker rolling  Once    Question:  Patient needs a walker to treat with the following condition  Answer:  Status post total left knee replacement   08/31/16 1704   08/31/16 1705  DME 3 n 1  Once     08/31/16 1704      Diagnostic Studies: Dg Knee Left Port  Result Date: 08/31/2016 CLINICAL DATA:  Knee replacement. EXAM: PORTABLE LEFT KNEE - 1-2 VIEW COMPARISON:  No recent prior. FINDINGS: Total left knee replacement. Hardware intact. Anatomic alignment . No acute bony abnormality. IMPRESSION: Total left knee replacement.  Anatomic alignment. Electronically Signed   By: Maisie Fus  Register   On: 08/31/2016 16:21    Disposition: 01-Home or Self Care  Discharge Instructions    Discharge patient    Complete by:  As directed     Discharge disposition:  01-Home or Self Care   Discharge patient date:  09/03/2016      Follow-up Information    [facility] Follow up.   Specialty:  Home Health Services Why:  home health physical therapy Contact information: [address] [place] [address] [phone]        Kathryne Hitch, MD. Schedule an appointment as soon as possible for a visit in 2 week(s).   Specialty:  Orthopedic Surgery Contact information: [address] [place] [address] [phone]            Signed: Kathryne Hitch 09/03/2016, 7:47 AM

## 2016-09-03 NOTE — Discharge Instructions (Signed)

## 2016-09-03 NOTE — Progress Notes (Signed)
Subjective: 3 Days Post-Op Procedure(s) (LRB): LEFT TOTAL KNEE ARTHROPLASTY (Left) Patient reports pain as moderate.  Good mobility with therapy.  Objective: Vital signs in last 24 hours: Temp:  [98 F (36.7 C)-98.8 F (37.1 C)] 98.7 F (37.1 C) (01/15 0620) Pulse Rate:  [66-92] 92 (01/15 0620) Resp:  [18] 18 (01/15 0620) BP: (106-157)/(55-76) 157/76 (01/15 0620) SpO2:  [93 %-100 %] 93 % (01/15 0620)  Intake/Output from previous day: 01/14 0701 - 01/15 0700 In: 1300 [P.O.:1300] Out: 0  Intake/Output this shift: No intake/output data recorded.   Recent Labs  09/01/16 0516  HGB 11.3*    Recent Labs  09/01/16 0516  WBC 11.8*  RBC 3.82*  HCT 34.0*  PLT 157    Recent Labs  09/01/16 0516  NA 136  K 4.0  CL 104  CO2 25  BUN 14  CREATININE 0.52  GLUCOSE 142*  CALCIUM 8.9   No results for input(s): LABPT, INR in the last 72 hours.  Sensation intact distally Intact pulses distally Dorsiflexion/Plantar flexion intact Incision: scant drainage No cellulitis present Compartment soft  Assessment/Plan: 3 Days Post-Op Procedure(s) (LRB): LEFT TOTAL KNEE ARTHROPLASTY (Left) Up with therapy  Discharge to home likely this afternoon.  Kathryne HitchChristopher Y Alontae Chaloux 09/03/2016, 7:45 AM

## 2016-09-03 NOTE — Progress Notes (Signed)
Physical Therapy Treatment Patient Details Name: [name] MRN: [id number] DOB: 1951-03-19 Today's Date: 09/03/2016    History of Present Illness 66 yo female s/p L TKA 08/31/16.    PT Comments    Pt ambulated in hallway and practiced safe stair technique.  Spouse arrived during stairs and educated as well. Spouse also educated on KI, and pt to maintain during standing and mobility.  Spouse reports he plans to assist pt at home and aware he will need to provide close supervision and safety cues for pt.  Pt to d/c home today.   Follow Up Recommendations  Home health PT;Supervision/Assistance - 24 hour     Equipment Recommendations  3in1 (PT)    Recommendations for Other Services       Precautions / Restrictions Precautions Precautions: Fall Required Braces or Orthoses: Knee Immobilizer - Left Knee Immobilizer - Left: Discontinue once straight leg raise with < 10 degree lag Restrictions Weight Bearing Restrictions: No LLE Weight Bearing: Weight bearing as tolerated    Mobility  Bed Mobility Overal bed mobility: Needs Assistance Bed Mobility: Supine to Sit     Supine to sit: Supervision     General bed mobility comments: cues for pt to self assist L LE (pt utilized UE assist technique)  Transfers Overall transfer level: Needs assistance Equipment used: Rolling walker (2 wheeled) Transfers: Sit to/from Stand Sit to Stand: Min guard         General transfer comment: Cues for safety, technique, hand/LE placement.   Ambulation/Gait Ambulation/Gait assistance: Min guard Ambulation Distance (Feet): 80 Feet Assistive device: Rolling walker (2 wheeled) Gait Pattern/deviations: Step-to pattern;Antalgic;Decreased stance time - left     General Gait Details: verbal cues for step length and RW positioning, distance limited due to pt's hunger (spouse arrived with breakfast during stairs)   Stairs Stairs: Yes   Stair Management: Step to pattern;One rail  Right Number of Stairs: 5 General stair comments: multimodal cues for sequence and safety, performed 2 rails once and then cued for only using one rail (spouse confirms 3-4 steps and one rail into home), spouse present and educated on safety and sequence as well - reports understanding and able to state back technique correctly  Wheelchair Mobility    Modified Rankin (Stroke Patients Only)       Balance                                    Cognition Arousal/Alertness: Awake/alert Behavior During Therapy: WFL for tasks assessed/performed Overall Cognitive Status: History of cognitive impairments - at baseline (spouse reports early onset Alzheimers)                 General Comments: requires safety cues    Exercises      General Comments        Pertinent Vitals/Pain Pain Assessment: Faces Faces Pain Scale: Hurts little more Pain Location: L knee Pain Descriptors / Indicators: Sore Pain Intervention(s): Limited activity within patient's tolerance;Monitored during session;Repositioned;Ice applied    Home Living                      Prior Function            PT Goals (current goals can now be found in the care plan section) Progress towards PT goals: Progressing toward goals    Frequency    7X/week      PT  Plan Current plan remains appropriate    Co-evaluation             End of Session Equipment Utilized During Treatment: Gait belt;Left knee immobilizer Activity Tolerance: Patient tolerated treatment well Patient left: in chair;with call bell/phone within reach;with chair alarm set;with family/visitor present     Time: [id number] PT Time Calculation (min) (ACUTE ONLY): 16 min  Charges:  $Gait Training: 8-22 mins                    G Codes:      Carey Lafon,KATHrine E 09/03/2016, 12:27 PM Zenovia JarredKati Taevin Mcferran, PT, DPT 09/03/2016 Pager: [phone]

## 2016-09-04 NOTE — Telephone Encounter (Signed)
Spoke with patient's husband (tom) he advised forms had been faxed in.

## 2016-09-13 ENCOUNTER — Ambulatory Visit (INDEPENDENT_AMBULATORY_CARE_PROVIDER_SITE_OTHER): Payer: Medicare Other | Admitting: Orthopaedic Surgery

## 2016-09-13 ENCOUNTER — Encounter (INDEPENDENT_AMBULATORY_CARE_PROVIDER_SITE_OTHER): Payer: Self-pay

## 2016-09-13 DIAGNOSIS — Z96652 Presence of left artificial knee joint: Secondary | ICD-10-CM

## 2016-09-13 MED ORDER — OXYCODONE-ACETAMINOPHEN 5-325 MG PO TABS: 1.0000 | ORAL_TABLET | Freq: Three times a day (TID) | ORAL | 0 refills | Status: DC | PRN

## 2016-09-13 NOTE — Progress Notes (Signed)
The patient is 2 weeks status post a left total knee replacement. She is with her family. She is really doing well. She has had some physical therapy saying that she is in her knee from 0-90. She does have mild to moderate Alzheimer's disease.  On examination her incision looks great. Removed the old Steri-Strips and placed new ones. Her calf is soft. She has 0-90 of motion. The knee feels MC stable.  This point on a transition to outpatient physical therapy. I did refill her pain medications. We'll see her back in 4 weeks to see how she doing overall but no x-rays are needed.

## 2016-10-02 ENCOUNTER — Telehealth (INDEPENDENT_AMBULATORY_CARE_PROVIDER_SITE_OTHER): Payer: Self-pay | Admitting: *Deleted

## 2016-10-02 NOTE — Telephone Encounter (Signed)
Ok to see if they can extend her home health therapy for another 3-4 weeks

## 2016-10-02 NOTE — Telephone Encounter (Signed)
Please advise 

## 2016-10-02 NOTE — Telephone Encounter (Signed)
[facility]

## 2016-10-02 NOTE — Telephone Encounter (Signed)
Chrissy from [facility] called this morning in regards to this patient. Patient's daughter thinks that this patient needs additional therapy? And if so we need a new order please. Her CB [phone]. Thank you

## 2016-10-05 ENCOUNTER — Telehealth: Payer: Self-pay | Admitting: Neurology

## 2016-10-05 NOTE — Telephone Encounter (Signed)
Patients daughter [name] (listed on DPR) called office in reference to major concerns with her mother.  Patient will tell everyone in the family she is okay/fine, but per daughter she clearly is not okay.  Patient has had increased confusion, making up stories, increasing of repeating herself, anger/crying issues, oral hygiene/hygiene in general have gotten bad.  [name] also feels patient is unsafe to drive and is wanting to know if her DL's are able to be taken away.  Please contact [name] only per [name] due to needing answers as to what steps will be next.  Please call

## 2016-10-05 NOTE — Telephone Encounter (Signed)
Spoke to daughter, Victorino DikeJennifer.  I relayed that of below.  She will contact her pcp for evaluation.  Discussed driving and taking car keys away.  If evaluation with pcp negative , then to call back, could be progression of dementia and an appt could be made to address this, and possible medication alternatives. She verbalized understanding.

## 2016-10-05 NOTE — Telephone Encounter (Signed)
Andrey CampanileSandy, would you mind calling back daughter I am out of the office. She should be evaluated by her primary care first asap to ensure she does not have any medical reason for the increased confusion especially a urinary tract infection which is common. She should absolutely not be driving and her keys should be taken away. If there is no cause found by her medical doctor then this is a progression of her dementia or possible mood disorder which is common in dementia and we can set up an appointment to discuss other medications we can use to try and help. But first is the appointment with pcp and a urinalysis and any other tests per pcp to ensure there is not a treatable medical cause. People with dementia are very sensitive and even though they don;t appear to have a UTI for example they can have one and it can cause acute worsening, treating it resolves the issues. thanks

## 2016-10-09 ENCOUNTER — Telehealth (INDEPENDENT_AMBULATORY_CARE_PROVIDER_SITE_OTHER): Payer: Self-pay | Admitting: *Deleted

## 2016-10-09 NOTE — Telephone Encounter (Signed)
[name] from [facility] called in this morning in regards to needing verbal orders for physical therapy for 3 times a week for 3 weeks please. His CB # [phone]. Thank you

## 2016-10-09 NOTE — Telephone Encounter (Signed)
Verbal order given to [name] 

## 2016-10-11 ENCOUNTER — Ambulatory Visit (INDEPENDENT_AMBULATORY_CARE_PROVIDER_SITE_OTHER): Payer: Medicare Other | Admitting: Orthopaedic Surgery

## 2016-10-11 DIAGNOSIS — Z96652 Presence of left artificial knee joint: Secondary | ICD-10-CM

## 2016-10-11 MED ORDER — METHOCARBAMOL 500 MG PO TABS: 500.0000 mg | ORAL_TABLET | Freq: Four times a day (QID) | ORAL | 0 refills | Status: DC | PRN

## 2016-10-11 MED ORDER — OXYCODONE-ACETAMINOPHEN 5-325 MG PO TABS: 1.0000 | ORAL_TABLET | Freq: Three times a day (TID) | ORAL | 0 refills | Status: DC | PRN

## 2016-10-11 NOTE — Progress Notes (Signed)
The patient is now 6 weeks status post a left total knee arthroplasty. She is only been doing home therapy not outpatient therapy. She does have a history of some dementia. Her husband is with her.  On exam she lacks full extension by about 3-5 and her flexion is less than 90. The knee is swollen but not infected.  I told her husband is essentially get her into outpatient physical therapy and I gave her prescription for this. We'll continue pain medication and muscle relaxant. I like see her back in 4 weeks if her not making progress with may need to consider manipulation under anesthesia.

## 2016-10-12 ENCOUNTER — Telehealth (INDEPENDENT_AMBULATORY_CARE_PROVIDER_SITE_OTHER): Payer: Self-pay | Admitting: Orthopaedic Surgery

## 2016-10-12 NOTE — Telephone Encounter (Signed)
LMOM for [name] to please call back and let us know exactly why she needs this for the patient

## 2016-10-12 NOTE — Telephone Encounter (Signed)
Need verbal order for MSW and if this could be done ASAP.

## 2016-10-15 ENCOUNTER — Telehealth (INDEPENDENT_AMBULATORY_CARE_PROVIDER_SITE_OTHER): Payer: Self-pay | Admitting: Orthopaedic Surgery

## 2016-10-15 NOTE — Telephone Encounter (Signed)
This ok? 

## 2016-10-15 NOTE — Telephone Encounter (Signed)
Verbal order given over her VM

## 2016-10-15 NOTE — Telephone Encounter (Signed)
That will be fine. 

## 2016-10-15 NOTE — Telephone Encounter (Signed)
[name] (nurse case Production designer, theatre/television/filmmanager) from [facility] called needing MSW eval visit do to home situation. Verbal order for Social worker to go out. The number to contact her is [phone]

## 2016-10-22 ENCOUNTER — Ambulatory Visit: Payer: Medicare Other | Attending: Orthopaedic Surgery

## 2016-10-22 DIAGNOSIS — M6281 Muscle weakness (generalized): Secondary | ICD-10-CM | POA: Diagnosis present

## 2016-10-22 DIAGNOSIS — R600 Localized edema: Secondary | ICD-10-CM | POA: Insufficient documentation

## 2016-10-22 DIAGNOSIS — R262 Difficulty in walking, not elsewhere classified: Secondary | ICD-10-CM | POA: Insufficient documentation

## 2016-10-22 DIAGNOSIS — M25662 Stiffness of left knee, not elsewhere classified: Secondary | ICD-10-CM | POA: Diagnosis present

## 2016-10-22 DIAGNOSIS — Z96652 Presence of left artificial knee joint: Secondary | ICD-10-CM | POA: Diagnosis present

## 2016-10-22 DIAGNOSIS — R42 Dizziness and giddiness: Secondary | ICD-10-CM | POA: Diagnosis present

## 2016-10-22 DIAGNOSIS — M25562 Pain in left knee: Secondary | ICD-10-CM

## 2016-10-22 NOTE — Therapy (Signed)
[facility] [address] [place], [place], [address] Phone: [phone]   Fax:  [phone]  Physical Therapy Evaluation  Patient Details  Name: [name] MRN: [id number] Date of Birth: February 14, 1951 Referring Provider: Doneen Poisson, MD  Encounter Date: 10/22/2016      PT End of Session - 10/22/16 1512    Visit Number 1   Number of Visits 24   Date for PT Re-Evaluation 12/28/16   Authorization Type Medicare BCBS   Authorization Time Period KX visit 15   PT Start Time 0300   PT Stop Time 0345   PT Time Calculation (min) 45 min   Activity Tolerance Patient tolerated treatment well;No increased pain   Behavior During Therapy WFL for tasks assessed/performed      Past Medical History:  Diagnosis Date  . Arthritis   . Asthma    as a child   . Carpal tunnel syndrome    hx of   . Dementia   . Eczema   . High cholesterol   . High cholesterol   . Hypertension   . Psoriasis     Past Surgical History:  Procedure Laterality Date  . HAND SURGERY     Carpal tunnel  . KNEE SURGERY    . SHOULDER SURGERY    . TONSILLECTOMY    . TOTAL KNEE ARTHROPLASTY Left 08/31/2016   Procedure: LEFT TOTAL KNEE ARTHROPLASTY;  Surgeon: Kathryne Hitch, MD;  Location: [facility];  Service: Orthopedics;  Laterality: Left;    There were no vitals filed for this visit.       Subjective Assessment - 10/22/16 1506    Subjective She reports post TKA LT knee.   She reports surgery due to pain and limitations..   Since surgery HHPT until a couple of weeks ago.   The incision is tender to contact and is quite painful so keeps pant leg rolled up   Patient is accompained by: Family member   Pertinent History Alzheimers    Limitations Walking;House hold activities;Standing   Patient Stated Goals Return to normal activity and be done with surgery   Currently in Pain? No/denies   Pain Score --  when she does have pain it is mild   Pain Location Knee   Pain Orientation Left;Anterior   Pain Descriptors / Indicators Aching   Pain Type Surgical pain   Pain Onset More than a month ago   Pain Frequency Intermittent   Aggravating Factors  touch to incision   Pain Relieving Factors medication, cold   Multiple Pain Sites No            [facility] PT Assessment - 10/22/16 0001      Assessment   Medical Diagnosis LT TKA   Referring Provider Doneen Poisson, MD   Onset Date/Surgical Date 08/31/16   Next MD Visit in next week or 2    Prior Therapy HHPT     Precautions   Precautions None   Precaution Comments Alzheimers     Restrictions   Weight Bearing Restrictions No     Balance Screen   Has the patient fallen in the past 6 months No   Has the patient had a decrease in activity level because of a fear of falling?  No   Is the patient reluctant to leave their home because of a fear of falling?  No     Home Tourist information centre manager residence   Living Arrangements Spouse/significant other  Type of Home House   Home Access Stairs to enter   Entrance Stairs-Number of Steps 2-3   Entrance Stairs-Rails Right   Home Layout Two level   Alternate Level Stairs-Number of Steps 12   Alternate Level Stairs-Rails Left     Prior Function   Level of Independence Needs assistance with homemaking   Vocation Works at home     Cognition   Overall Cognitive Status Within Functional Limits for tasks assessed     ROM / Strength   AROM / PROM / Strength AROM;PROM;Strength     AROM   AROM Assessment Site Knee   Right/Left Knee Right;Left   Right Knee Extension 0   Right Knee Flexion 45   Left Knee Extension -22   Left Knee Flexion 68     PROM   PROM Assessment Site Knee   Right/Left Knee Left   Left Knee Extension -15   Left Knee Flexion 74     Strength   Overall Strength Comments hip strength LT 4-4+/5   Strength Assessment Site Knee   Right/Left Knee Right;Left   Right Knee Flexion 5/5    Right Knee Extension 5/5   Left Knee Flexion 4/5   Left Knee Extension 4-/5     Flexibility   Soft Tissue Assessment /Muscle Length yes     Ambulation/Gait   Ambulation Distance (Feet) 100 Feet   Assistive device None   Gait Pattern Decreased stance time - left;Decreased hip/knee flexion - left;Decreased step length - right   Ambulation Surface Level;Indoor   Gait Comments no device today  but walks 1/2 time  with [facility],                    [facility] Adult PT Treatment/Exercise - 10/22/16 0001      Exercises   Exercises Knee/Hip     Knee/Hip Exercises: Aerobic   Nustep L3  LE only 5 min     Knee/Hip Exercises: Seated   Long Arc Quad Left;10 reps     Knee/Hip Exercises: Supine   Quad Sets Left;15 reps   Heel Slides Left;5 reps   Straight Leg Raises Left;5 reps                PT Education - 10/22/16 1536    Education provided Yes   Education Details POC    Person(s) Educated Patient;Spouse   Methods Explanation   Comprehension Verbalized understanding          PT Short Term Goals - 10/22/16 1558      PT SHORT TERM GOAL #1   Title She will be independent with inital HEP   Time 4   Period Weeks     PT SHORT TERM GOAL #2   Title She will improve active extension to 0 degrees   Time 4   Period Weeks   Status New     PT SHORT TERM GOAL #3   Title she will have active LT knee flexion to 100 degrees   Time 4   Period Weeks   Status New     PT SHORT TERM GOAL #4   Title she will report able to have pant leg on skin due to decr sensitivity   Time 4   Period Weeks   Status New           PT Long Term Goals - 10/22/16 1600      PT LONG TERM GOAL #1   Title She will be independent  with all HEP issued   Time 10   Period Weeks   Status New     PT LONG TERM GOAL #2   Title She will walk 100% without device in ans out of home   Time 10   Period Weeks   Status New     PT LONG TERM GOAL #3   Title she will have 125 degrees active flexion  to improve gait    Time 10   Period Weeks   Status New     PT LONG TERM GOAL #4   Title She will be able to stand min 10 sec on each leg to demo good balance for independnece in community   Time 10   Period Weeks   Status New     PT LONG TERM GOAL #5   Title She and spouse will report return to normal activity in home    Time 10   Period Weeks   Status New     Additional Long Term Goals   Additional Long Term Goals Yes     PT LONG TERM GOAL #6   Title She will be able to walk 12 steps up and down step ove rstep safely to be ale to access upper floor at home safely.   Time 12   Period Weeks   Status New     PT LONG TERM GOAL #7   Title She will report 1-2 max pain with normal home activity   Time 12   Period Weeks   Status New               Plan - 17-Nov-2016 1536    Clinical Impression Statement Ms Michna presents for moderate complexity eval post LT TKA  with stiffness /pain  swelling and weakness making mobility difficult and increased sensitivty to touch anterior knee    Rehab Potential Good   PT Frequency 3x / week   PT Duration 3 weeks  after 2x/week first week then 2x/week for 6 weeks   PT Treatment/Interventions Cryotherapy;Electrical Stimulation;Moist Heat;Patient/family education;Passive range of motion;Stair training;Gait training;Taping;Manual techniques;Therapeutic exercise   PT Next Visit Plan review HEPmanual for range ,pain and swelling /tenderness, modalities if tolerated , strengthening   Consulted and Agree with Plan of Care Patient      Patient will benefit from skilled therapeutic intervention in order to improve the following deficits and impairments:  Difficulty walking, Abnormal gait, Decreased range of motion, Pain, Increased edema, Decreased strength, Decreased activity tolerance, Decreased balance, Impaired sensation  Visit Diagnosis: History of total knee arthroplasty, left - Plan: PT plan of care cert/re-cert  Stiffness of left knee,  not elsewhere classified - Plan: PT plan of care cert/re-cert  Muscle weakness (generalized) - Plan: PT plan of care cert/re-cert  Acute pain of left knee - Plan: PT plan of care cert/re-cert  Localized edema - Plan: PT plan of care cert/re-cert  Difficulty in walking, not elsewhere classified - Plan: PT plan of care cert/re-cert      G-Codes - 2016-11-17 1604    Functional Assessment Tool Used (Outpatient Only) FOTO 52% limited   Functional Limitation Mobility: Walking and moving around   Mobility: Walking and Moving Around Current Status (W0981) At least 40 percent but less than 60 percent impaired, limited or restricted   Mobility: Walking and Moving Around Goal Status (X9147) At least 20 percent but less than 40 percent impaired, limited or restricted       Problem List Patient Active Problem List  Diagnosis Date Noted  . Unilateral primary osteoarthritis, left knee 08/31/2016  . Status post total left knee replacement 08/31/2016  . Major neurocognitive disorder 11/27/2015  . Alzheimer's dementia 11/02/2015  . Vertigo 05/02/2015  . Subacute confusional state 05/02/2015  . Acute encephalopathy   . Memory deficit   . Hyperlipidemia   . Prediabetes   . TIA (transient ischemic attack) 04/16/2015    Caprice RedChasse, Marvella Jenning M   PT 10/22/2016, 4:12 PM  [facility] [address] [place], [place], [address] Phone: [phone]   Fax:  [phone]  Name: [name] MRN: [id number] Date of Birth: [birth year]

## 2016-10-30 ENCOUNTER — Ambulatory Visit: Payer: Medicare Other | Admitting: Physical Therapy

## 2016-10-30 DIAGNOSIS — Z96652 Presence of left artificial knee joint: Secondary | ICD-10-CM

## 2016-10-30 DIAGNOSIS — R262 Difficulty in walking, not elsewhere classified: Secondary | ICD-10-CM

## 2016-10-30 DIAGNOSIS — M25662 Stiffness of left knee, not elsewhere classified: Secondary | ICD-10-CM

## 2016-10-30 DIAGNOSIS — M6281 Muscle weakness (generalized): Secondary | ICD-10-CM

## 2016-10-30 DIAGNOSIS — R600 Localized edema: Secondary | ICD-10-CM

## 2016-10-30 DIAGNOSIS — M25562 Pain in left knee: Secondary | ICD-10-CM

## 2016-10-30 NOTE — Therapy (Signed)
[facility] [address] [place], [place], [address] Phone: [phone]   Fax:  [phone]  Physical Therapy Treatment  Patient Details  Name: [name] MRN: [id number] Date of Birth: 1950-09-09 Referring Provider: Doneen Poisson, MD  Encounter Date: 10/30/2016      PT End of Session - 10/30/16 1622    Visit Number 2   Number of Visits 24   Date for PT Re-Evaluation 12/28/16   Authorization Type Medicare BCBS   Authorization Time Period KX visit 15   PT Start Time 0346   PT Stop Time 0440   PT Time Calculation (min) 54 min      Past Medical History:  Diagnosis Date  . Arthritis   . Asthma    as a child   . Carpal tunnel syndrome    hx of   . Dementia   . Eczema   . High cholesterol   . High cholesterol   . Hypertension   . Psoriasis     Past Surgical History:  Procedure Laterality Date  . HAND SURGERY     Carpal tunnel  . KNEE SURGERY    . SHOULDER SURGERY    . TONSILLECTOMY    . TOTAL KNEE ARTHROPLASTY Left 08/31/2016   Procedure: LEFT TOTAL KNEE ARTHROPLASTY;  Surgeon: Kathryne Hitch, MD;  Location: [facility];  Service: Orthopedics;  Laterality: Left;    There were no vitals filed for this visit.      Subjective Assessment - 10/30/16 1557    Subjective Pt reports she forgot her cane at home.    Currently in Pain? No/denies                         [facility] Adult PT Treatment/Exercise - 10/30/16 0001      Self-Care   Self-Care Scar Mobilizations;Other Self-Care Comments   Scar Mobilizations cross friction and circles to length of scar, especially distally where restricted.    Other Self-Care Comments  Desentization with towel , self patella mobs      Knee/Hip Exercises: Stretches   Knee: Self-Stretch Limitations seated with opposite foot over pressure      Knee/Hip Exercises: Aerobic   Nustep L1 x 5 minutes      Knee/Hip Exercises: Seated   Heel Slides --   Heel Slides Limitations heel slide then knee flexion with over pressure     Knee/Hip Exercises: Supine   Quad Sets Left;15 reps   Heel Slides 20 reps     Modalities   Modalities Cryotherapy     Cryotherapy   Number Minutes Cryotherapy 10 Minutes   Cryotherapy Location Knee   Type of Cryotherapy Ice pack     Manual Therapy   Manual Therapy Joint mobilization   Joint Mobilization gentle grade 1 oscillations to left knee for pain, pt tolerated well                  PT Short Term Goals - 10/22/16 1558      PT SHORT TERM GOAL #1   Title She will be independent with inital HEP   Time 4   Period Weeks     PT SHORT TERM GOAL #2   Title She will improve active extension to 0 degrees   Time 4   Period Weeks   Status New     PT SHORT TERM GOAL #3   Title she will have active LT knee flexion to 100  degrees   Time 4   Period Weeks   Status New     PT SHORT TERM GOAL #4   Title she will report able to have pant leg on skin due to decr sensitivity   Time 4   Period Weeks   Status New           PT Long Term Goals - 10/22/16 1600      PT LONG TERM GOAL #1   Title She will be independent with all HEP issued   Time 10   Period Weeks   Status New     PT LONG TERM GOAL #2   Title She will walk 100% without device in ans out of home   Time 10   Period Weeks   Status New     PT LONG TERM GOAL #3   Title she will have 125 degrees active flexion to improve gait    Time 10   Period Weeks   Status New     PT LONG TERM GOAL #4   Title She will be able to stand min 10 sec on each leg to demo good balance for independnece in community   Time 10   Period Weeks   Status New     PT LONG TERM GOAL #5   Title She and spouse will report return to normal activity in home    Time 10   Period Weeks   Status New     Additional Long Term Goals   Additional Long Term Goals Yes     PT LONG TERM GOAL #6   Title She will be able to walk 12 steps up and down step ove  rstep safely to be ale to access upper floor at home safely.   Time 12   Period Weeks   Status New     PT LONG TERM GOAL #7   Title She will report 1-2 max pain with normal home activity   Time 12   Period Weeks   Status New               Plan - 10/30/16 1707    Clinical Impression Statement Pt enters without AD and limited knee flexion during gait. She reports she forgot her cane. Family did not accompany her back into clinic. She is reluctant to flex knee more than 45 degrees in supine however in sitting she measures 70 degrees AAROM. Instructed pt in self scar massage and desensitization and patellar mobs. She reports scar sensitivity at night when trying to sleep.    PT Next Visit Plan Ask patient's husband to come back with her due to dementia; assess carry over of self care from this visit. issue seated knee flexion stretch with overpressure for HEP, manual for range;  strengthening.    Consulted and Agree with Plan of Care Patient      Patient will benefit from skilled therapeutic intervention in order to improve the following deficits and impairments:  Difficulty walking, Abnormal gait, Decreased range of motion, Pain, Increased edema, Decreased strength, Decreased activity tolerance, Decreased balance, Impaired sensation  Visit Diagnosis: History of total knee arthroplasty, left  Stiffness of left knee, not elsewhere classified  Muscle weakness (generalized)  Acute pain of left knee  Localized edema  Difficulty in walking, not elsewhere classified     Problem List Patient Active Problem List   Diagnosis Date Noted  . Unilateral primary osteoarthritis, left knee 08/31/2016  . Status post total left knee  replacement 08/31/2016  . Major neurocognitive disorder 11/27/2015  . Alzheimer's dementia 11/02/2015  . Vertigo 05/02/2015  . Subacute confusional state 05/02/2015  . Acute encephalopathy   . Memory deficit   . Hyperlipidemia   . Prediabetes   . TIA  (transient ischemic attack) 04/16/2015    Sherrie Mustache, PTA 10/30/2016, 5:27 PM  [facility] [address] [place], [place], [address] Phone: [phone]   Fax:  [phone]  Name: [name] MRN: [id number] Date of Birth: 05-26-1951

## 2016-11-01 ENCOUNTER — Encounter: Payer: Self-pay | Admitting: Physical Therapy

## 2016-11-01 ENCOUNTER — Ambulatory Visit: Payer: Medicare Other | Admitting: Physical Therapy

## 2016-11-01 DIAGNOSIS — R262 Difficulty in walking, not elsewhere classified: Secondary | ICD-10-CM

## 2016-11-01 DIAGNOSIS — Z96652 Presence of left artificial knee joint: Secondary | ICD-10-CM

## 2016-11-01 DIAGNOSIS — M6281 Muscle weakness (generalized): Secondary | ICD-10-CM

## 2016-11-01 DIAGNOSIS — M25662 Stiffness of left knee, not elsewhere classified: Secondary | ICD-10-CM

## 2016-11-01 DIAGNOSIS — R600 Localized edema: Secondary | ICD-10-CM

## 2016-11-01 DIAGNOSIS — R42 Dizziness and giddiness: Secondary | ICD-10-CM

## 2016-11-01 DIAGNOSIS — M25562 Pain in left knee: Secondary | ICD-10-CM

## 2016-11-01 NOTE — Therapy (Signed)
[facility] [address] [place], [place], [address] Phone: [phone]   Fax:  [phone]  Physical Therapy Treatment  Patient Details  Name: [name] MRN: [id number] Date of Birth: 23-Dec-1950 Referring Provider: Doneen Poissonhristopher Blackman, MD  Encounter Date: 11/01/2016      PT End of Session - 11/01/16 1647    Visit Number 2   Number of Visits 24   Date for PT Re-Evaluation 12/28/16   PT Start Time 1545   PT Stop Time 1640   PT Time Calculation (min) 55 min   Behavior During [facility] for tasks assessed/performed      Past Medical History:  Diagnosis Date  . Arthritis   . Asthma    as a child   . Carpal tunnel syndrome    hx of   . Dementia   . Eczema   . High cholesterol   . High cholesterol   . Hypertension   . Psoriasis     Past Surgical History:  Procedure Laterality Date  . HAND SURGERY     Carpal tunnel  . KNEE SURGERY    . SHOULDER SURGERY    . TONSILLECTOMY    . TOTAL KNEE ARTHROPLASTY Left 08/31/2016   Procedure: LEFT TOTAL KNEE ARTHROPLASTY;  Surgeon: Kathryne Hitchhristopher Y Blackman, MD;  Location: [facility];  Service: Orthopedics;  Laterality: Left;    There were no vitals filed for this visit.      Subjective Assessment - 11/01/16 1600    Subjective I am in average pain.  5/10   I can't sleep.  It feels like it is awake,  i exercise 3 x a day.  I'm  tired.    Currently in Pain? Yes   Pain Score 3    Pain Location Knee   Pain Descriptors / Indicators Aching   Pain Onset More than a month ago   Pain Frequency Intermittent   Aggravating Factors  some stretching   Pain Relieving Factors meds,  cold   Multiple Pain Sites --  Left hip pain moderate.  Arthritis pain,  needs a Total hip.                          [facility] Adult PT Treatment/Exercise - 11/01/16 0001      Manual Therapy   Manual Therapy Joint mobilization   Joint Mobilization Strap mobilization with movement  5 reps  grade 2,, 3                 PT Education - 11/01/16 1647    Education provided Yes   Education Details HEP   Person(s) Educated Patient;Spouse   Methods Explanation;Demonstration;Tactile cues;Verbal cues;Handout   Comprehension Verbalized understanding;Returned demonstration          PT Short Term Goals - 11/01/16 1649      PT SHORT TERM GOAL #1   Title She will be independent with inital HEP   Baseline neweds assist with husband   Time 4   Period Weeks   Status On-going     PT SHORT TERM GOAL #2   Title She will improve active extension to 0 degrees   Baseline not formally measures  3 finger's width  from mat   Time 4   Period Weeks   Status On-going     PT SHORT TERM GOAL #3   Title she will have active LT knee flexion to 100 degrees   Time 4  Period Weeks   Status Unable to assess     PT SHORT TERM GOAL #4   Title she will report able to have pant leg on skin due to decr sensitivity   Baseline still sensitive   Time 4   Period Weeks   Status On-going           PT Long Term Goals - 10/22/16 1600      PT LONG TERM GOAL #1   Title She will be independent with all HEP issued   Time 10   Period Weeks   Status New     PT LONG TERM GOAL #2   Title She will walk 100% without device in ans out of home   Time 10   Period Weeks   Status New     PT LONG TERM GOAL #3   Title she will have 125 degrees active flexion to improve gait    Time 10   Period Weeks   Status New     PT LONG TERM GOAL #4   Title She will be able to stand min 10 sec on each leg to demo good balance for independnece in community   Time 10   Period Weeks   Status New     PT LONG TERM GOAL #5   Title She and spouse will report return to normal activity in home    Time 10   Period Weeks   Status New     Additional Long Term Goals   Additional Long Term Goals Yes     PT LONG TERM GOAL #6   Title She will be able to walk 12 steps up and down step ove rstep safely to  be ale to access upper floor at home safely.   Time 12   Period Weeks   Status New     PT LONG TERM GOAL #7   Title She will report 1-2 max pain with normal home activity   Time 12   Period Weeks   Status New             Patient will benefit from skilled therapeutic intervention in order to improve the following deficits and impairments:     Visit Diagnosis: History of total knee arthroplasty, left  Stiffness of left knee, not elsewhere classified  Muscle weakness (generalized)  Acute pain of left knee  Localized edema  Difficulty in walking, not elsewhere classified  Dizziness and giddiness     Problem List Patient Active Problem List   Diagnosis Date Noted  . Unilateral primary osteoarthritis, left knee 08/31/2016  . Status post total left knee replacement 08/31/2016  . Major neurocognitive disorder 11/27/2015  . Alzheimer's dementia 11/02/2015  . Vertigo 05/02/2015  . Subacute confusional state 05/02/2015  . Acute encephalopathy   . Memory deficit   . Hyperlipidemia   . Prediabetes   . TIA (transient ischemic attack) 04/16/2015    HARRIS,KAREN PTA 11/01/2016, 4:56 PM  [facility] [address] [place], [place], [address] Phone: [phone]   Fax:  [phone]  Name: [name] MRN: [id number] Date of Birth: 04/06/1951

## 2016-11-01 NOTE — Patient Instructions (Signed)
Quad Sets    Slowly tighten thigh muscles of straight, left leg while counting out loud to _5___. Relax.  Pull toes back toward knee.HIP / KNEE: Flexion, Heel Slides - Supine    Slide heel up toward buttocks, keeping leg in straight line. _10-reps per set, 2-3___ sets per day, _7Long Arc Quad    Straighten operated leg and try to hold it __5__ seconds. Repeat __10-20__ times. Do _2-3___ sessions a day.  http://gt2.exer.us/311   Copyright  VHI. All rights reserved.  __Sitting knee flexion Use towel or pillowcase under heel as needed.  Bend knee as far as it will go then tap toes 10 X.  10-20 X  Copyright  VHI. All rights reserved.     http://gt2.exer.us/293   Copyright  VHI. All rights reserved.  From exercise drawer Pre gait wall slides 5 - 30 X each. First exercise for now,  1 X a day with husband.

## 2016-11-02 ENCOUNTER — Ambulatory Visit: Payer: Medicare Other | Admitting: Physical Therapy

## 2016-11-06 ENCOUNTER — Ambulatory Visit: Payer: Medicare Other

## 2016-11-07 ENCOUNTER — Ambulatory Visit (INDEPENDENT_AMBULATORY_CARE_PROVIDER_SITE_OTHER): Payer: Medicare Other | Admitting: Orthopaedic Surgery

## 2016-11-07 DIAGNOSIS — Z96652 Presence of left artificial knee joint: Secondary | ICD-10-CM

## 2016-11-07 NOTE — Progress Notes (Signed)
The patient is now about 9 weeks status post a left knee replacement. She has some mild dementia. She is working through outpatient physical therapy but is still having problems getting her knee flexed far.  On examination there is still residual swelling about her knee to be expected at this time postoperative. Her incisions well-healed. The knee is ligamentously stable. She has almost full extension but flexion is to just 90.  I talked to her and her husband about this in length I do feel that she would benefit from a manipulation under anesthesia. I will give her 3 more weeks with physical therapy with working on aggressive motion. I'll help them understand that this needs to be done still on her own at least 2 or 3 times daily. We'll see what her exam is like in 3 weeks with the idea that we may proceed with a manipulation at that point.

## 2016-11-08 ENCOUNTER — Ambulatory Visit: Payer: Medicare Other

## 2016-11-08 DIAGNOSIS — M6281 Muscle weakness (generalized): Secondary | ICD-10-CM

## 2016-11-08 DIAGNOSIS — Z96652 Presence of left artificial knee joint: Secondary | ICD-10-CM

## 2016-11-08 DIAGNOSIS — R262 Difficulty in walking, not elsewhere classified: Secondary | ICD-10-CM

## 2016-11-08 DIAGNOSIS — M25662 Stiffness of left knee, not elsewhere classified: Secondary | ICD-10-CM

## 2016-11-08 DIAGNOSIS — R42 Dizziness and giddiness: Secondary | ICD-10-CM

## 2016-11-08 DIAGNOSIS — R600 Localized edema: Secondary | ICD-10-CM

## 2016-11-08 DIAGNOSIS — M25562 Pain in left knee: Secondary | ICD-10-CM

## 2016-11-08 NOTE — Therapy (Signed)
[facility] [address] [place], [place], [address] Phone: [phone]   Fax:  [phone]  Physical Therapy Treatment  Patient Details  Name: [name] MRN: [id number] Date of Birth: [birth year] Referring Provider: Doneen Poissonhristopher Blackman, MD  Encounter Date: 11/08/2016      PT End of Session - 11/08/16 1517    Visit Number 3   Number of Visits 24   Date for PT Re-Evaluation 12/28/16   Authorization Type Medicare BCBS   Authorization Time Period KX visit 15   PT Start Time 0315   PT Stop Time 0412   PT Time Calculation (min) 57 min   Activity Tolerance Patient tolerated treatment well   Behavior During Therapy [facility] for tasks assessed/performed      Past Medical History:  Diagnosis Date  . Arthritis   . Asthma    as a child   . Carpal tunnel syndrome    hx of   . Dementia   . Eczema   . High cholesterol   . High cholesterol   . Hypertension   . Psoriasis     Past Surgical History:  Procedure Laterality Date  . HAND SURGERY     Carpal tunnel  . KNEE SURGERY    . SHOULDER SURGERY    . TONSILLECTOMY    . TOTAL KNEE ARTHROPLASTY Left 08/31/2016   Procedure: LEFT TOTAL KNEE ARTHROPLASTY;  Surgeon: Kathryne Hitchhristopher Y Blackman, MD;  Location: [facility];  Service: Orthopedics;  Laterality: Left;    There were no vitals filed for this visit.          [facility] - 11/08/16 0001      AROM   Left Knee Extension -22   Left Knee Flexion 80     PROM   Left Knee Extension -15   Left Knee Flexion 90     Strength   Left Knee Flexion 4+/5   Left Knee Extension 4+/5                     [facility] Adult PT Treatment/Exercise - 11/08/16 0001      Knee/Hip Exercises: Aerobic   Nustep L3 LE 7 min     Knee/Hip Exercises: Standing   Lateral Step Up Left;Hand Hold: 1;Step Height: 6";20 reps   Other Standing Knee Exercises knee flexion x 18 reps RT/LT      Knee/Hip Exercises: Seated   Long Arc  Quad Left  30 reps 5 sec hold   Long Arc Quad Weight 3 lbs.   Heel Slides Limitations heel slide then knee flexion with over pressure     Knee/Hip Exercises: Supine   Quad Sets Left;20 reps   Quad Sets Limitations with knee extended heel on ground     Cryotherapy   Number Minutes Cryotherapy 10 Minutes   Cryotherapy Location Knee   Type of Cryotherapy Ice pack     Manual Therapy   Joint Mobilization posterior and anteiror glides grade 3-4 and passive ROM flexion and extension                  PT Short Term Goals - 11/08/16 1606      PT SHORT TERM GOAL #1   Title She will be independent with inital HEP   Status On-going     PT SHORT TERM GOAL #2   Title She will improve active extension to 0 degrees   Status On-going     PT SHORT TERM GOAL #  3   Title she will have active LT knee flexion to 100 degrees   Baseline 85 today   Status On-going     PT SHORT TERM GOAL #4   Title she will report able to have pant leg on skin due to decr sensitivity   Status On-going           PT Long Term Goals - 10/22/16 1600      PT LONG TERM GOAL #1   Title She will be independent with all HEP issued   Time 10   Period Weeks   Status New     PT LONG TERM GOAL #2   Title She will walk 100% without device in ans out of home   Time 10   Period Weeks   Status New     PT LONG TERM GOAL #3   Title she will have 125 degrees active flexion to improve gait    Time 10   Period Weeks   Status New     PT LONG TERM GOAL #4   Title She will be able to stand min 10 sec on each leg to demo good balance for independnece in community   Time 10   Period Weeks   Status New     PT LONG TERM GOAL #5   Title She and spouse will report return to normal activity in home    Time 10   Period Weeks   Status New     Additional Long Term Goals   Additional Long Term Goals Yes     PT LONG TERM GOAL #6   Title She will be able to walk 12 steps up and down step ove rstep safely to be  ale to access upper floor at home safely.   Time 12   Period Weeks   Status New     PT LONG TERM GOAL #7   Title She will report 1-2 max pain with normal home activity   Time 12   Period Weeks   Status New               Plan - 11/08/16 1605    Clinical Impression Statement ROM active and passive flexion improving , extension about the same. She reported some pain posterior lateral LT knee   PT Treatment/Interventions Cryotherapy;Electrical Stimulation;Moist Heat;Patient/family education;Passive range of motion;Stair training;Gait training;Taping;Manual techniques;Therapeutic exercise   PT Next Visit Plan Continue ROM and strengthening as tolearted . May emphasis extenison  next visit   Consulted and Agree with Plan of Care Patient      Patient will benefit from skilled therapeutic intervention in order to improve the following deficits and impairments:  Difficulty walking, Abnormal gait, Decreased range of motion, Pain, Increased edema, Decreased strength, Decreased activity tolerance, Decreased balance, Impaired sensation  Visit Diagnosis: History of total knee arthroplasty, left  Stiffness of left knee, not elsewhere classified  Muscle weakness (generalized)  Acute pain of left knee  Localized edema  Difficulty in walking, not elsewhere classified  Dizziness and giddiness     Problem List Patient Active Problem List   Diagnosis Date Noted  . Unilateral primary osteoarthritis, left knee 08/31/2016  . Status post total left knee replacement 08/31/2016  . Major neurocognitive disorder 11/27/2015  . Alzheimer's dementia 11/02/2015  . Vertigo 05/02/2015  . Subacute confusional state 05/02/2015  . Acute encephalopathy   . Memory deficit   . Hyperlipidemia   . Prediabetes   . TIA (transient ischemic attack) 04/16/2015  Caprice Red  PT 11/08/2016, 4:09 PM  [facility] [address] [place],  [place], [address] Phone: [phone]   Fax:  [phone]  Name: [name] MRN: [id number] Date of Birth: 09-21-1950

## 2016-11-09 ENCOUNTER — Ambulatory Visit: Payer: Medicare Other | Admitting: Physical Therapy

## 2016-11-12 ENCOUNTER — Ambulatory Visit: Payer: Medicare Other

## 2016-11-12 DIAGNOSIS — M25662 Stiffness of left knee, not elsewhere classified: Secondary | ICD-10-CM

## 2016-11-12 DIAGNOSIS — M25562 Pain in left knee: Secondary | ICD-10-CM

## 2016-11-12 DIAGNOSIS — Z96652 Presence of left artificial knee joint: Secondary | ICD-10-CM

## 2016-11-12 DIAGNOSIS — R262 Difficulty in walking, not elsewhere classified: Secondary | ICD-10-CM

## 2016-11-12 DIAGNOSIS — R600 Localized edema: Secondary | ICD-10-CM

## 2016-11-12 DIAGNOSIS — M6281 Muscle weakness (generalized): Secondary | ICD-10-CM

## 2016-11-12 NOTE — Therapy (Signed)
[facility] [address] [place], [place], [address] Phone: [phone]   Fax:  [phone]  Physical Therapy Treatment  Patient Details  Name: MARGRIT MINNER MRN: [id number] Date of Birth: [birth year] Referring Provider: Doneen Poisson, MD  Encounter Date: 11/12/2016      PT End of Session - 11/12/16 1541    Visit Number 4   Number of Visits 24   Date for PT Re-Evaluation 12/28/16   Authorization Type Medicare BCBS   Authorization Time Period KX visit 15   PT Start Time 0338   PT Stop Time 0430   PT Time Calculation (min) 52 min   Activity Tolerance Patient tolerated treatment well   Behavior During Therapy [facility] for tasks assessed/performed      Past Medical History:  Diagnosis Date  . Arthritis   . Asthma    as a child   . Carpal tunnel syndrome    hx of   . Dementia   . Eczema   . High cholesterol   . High cholesterol   . Hypertension   . Psoriasis     Past Surgical History:  Procedure Laterality Date  . HAND SURGERY     Carpal tunnel  . KNEE SURGERY    . SHOULDER SURGERY    . TONSILLECTOMY    . TOTAL KNEE ARTHROPLASTY Left 08/31/2016   Procedure: LEFT TOTAL KNEE ARTHROPLASTY;  Surgeon: Kathryne Hitch, MD;  Location: [facility];  Service: Orthopedics;  Laterality: Left;    There were no vitals filed for this visit.          [facility] PT Assessment - 11/12/16 0001      Observation/Other Assessments-Edema    Edema Circumferential     Circumferential Edema   Circumferential - Right 42.5 cm mid patella   Circumferential - Left  44 cm mid patella     PROM   Left Knee Extension -13   Left Knee Flexion 84                     [facility] Adult PT Treatment/Exercise - 11/12/16 0001      Knee/Hip Exercises: Standing   Other Standing Knee Exercises marching /walk with emphasis on knee flexion and  walking backward then face wall with arm raise and opposite leg lift     Knee/Hip  Exercises: Seated   Long Arc Quad Left   Long Arc Quad Weight 5 lbs.   Long [employer] Limitations 25 reps     Knee/Hip Exercises: Supine   Quad Sets Left   Quad Sets Limitations with knee extended for stretch x 30    Heel Slides 20 reps     Cryotherapy   Number Minutes Cryotherapy --   Cryotherapy Location --   Type of Cryotherapy --     Manual Therapy   Manual Therapy Soft tissue mobilization   Joint Mobilization posterior and anteiror glides grade 3-4 and passive ROM flexion and extension, patella mobs all planes and scar mobs   Soft tissue mobilization with and without tool to knee and quad  for retrograd massage /STW and desensitization                  PT Short Term Goals - 11/08/16 1606      PT SHORT TERM GOAL #1   Title She will be independent with inital HEP   Status On-going     PT SHORT TERM GOAL #2   Title She  will improve active extension to 0 degrees   Status On-going     PT SHORT TERM GOAL #3   Title she will have active LT knee flexion to 100 degrees   Baseline 85 today   Status On-going     PT SHORT TERM GOAL #4   Title she will report able to have pant leg on skin due to decr sensitivity   Status On-going           PT Long Term Goals - 10/22/16 1600      PT LONG TERM GOAL #1   Title She will be independent with all HEP issued   Time 10   Period Weeks   Status New     PT LONG TERM GOAL #2   Title She will walk 100% without device in ans out of home   Time 10   Period Weeks   Status New     PT LONG TERM GOAL #3   Title she will have 125 degrees active flexion to improve gait    Time 10   Period Weeks   Status New     PT LONG TERM GOAL #4   Title She will be able to stand min 10 sec on each leg to demo good balance for independnece in community   Time 10   Period Weeks   Status New     PT LONG TERM GOAL #5   Title She and spouse will report return to normal activity in home    Time 10   Period Weeks   Status New      Additional Long Term Goals   Additional Long Term Goals Yes     PT LONG TERM GOAL #6   Title She will be able to walk 12 steps up and down step ove rstep safely to be ale to access upper floor at home safely.   Time 12   Period Weeks   Status New     PT LONG TERM GOAL #7   Title She will report 1-2 max pain with normal home activity   Time 12   Period Weeks   Status New               Plan - 11/12/16 1543    Clinical Impression Statement Declined cold today. ROM decr PROM flexion today. Her pin tolearance limts what we can do.  She tries but have to stop if incr streching more than mild stretch   PT Treatment/Interventions Cryotherapy;Electrical Stimulation;Moist Heat;Patient/family education;Passive range of motion;Stair training;Gait training;Taping;Manual techniques;Therapeutic exercise   PT Next Visit Plan Continue ROM and strengthening as tolearted . May emphasis extenison  next visit   Consulted and Agree with Plan of Care Patient      Patient will benefit from skilled therapeutic intervention in order to improve the following deficits and impairments:  Difficulty walking, Abnormal gait, Decreased range of motion, Pain, Increased edema, Decreased strength, Decreased activity tolerance, Decreased balance, Impaired sensation  Visit Diagnosis: History of total knee arthroplasty, left  Stiffness of left knee, not elsewhere classified  Muscle weakness (generalized)  Acute pain of left knee  Localized edema  Difficulty in walking, not elsewhere classified     Problem List Patient Active Problem List   Diagnosis Date Noted  . Unilateral primary osteoarthritis, left knee 08/31/2016  . Status post total left knee replacement 08/31/2016  . Major neurocognitive disorder 11/27/2015  . Alzheimer's dementia 11/02/2015  . Vertigo 05/02/2015  . Subacute confusional state  05/02/2015  . Acute encephalopathy   . Memory deficit   . Hyperlipidemia   . Prediabetes   .  TIA (transient ischemic attack) 04/16/2015    Caprice Red  PT 11/12/2016, 4:39 PM  [facility] [address] [place], [place], [address] Phone: [phone]   Fax:  [phone]  Name: ELANDRA POWELL MRN: [id number] Date of Birth: 1951-08-17

## 2016-11-13 ENCOUNTER — Ambulatory Visit: Payer: Medicare Other

## 2016-11-13 DIAGNOSIS — R600 Localized edema: Secondary | ICD-10-CM

## 2016-11-13 DIAGNOSIS — M25562 Pain in left knee: Secondary | ICD-10-CM

## 2016-11-13 DIAGNOSIS — M25662 Stiffness of left knee, not elsewhere classified: Secondary | ICD-10-CM

## 2016-11-13 DIAGNOSIS — Z96652 Presence of left artificial knee joint: Secondary | ICD-10-CM | POA: Diagnosis not present

## 2016-11-13 DIAGNOSIS — R262 Difficulty in walking, not elsewhere classified: Secondary | ICD-10-CM

## 2016-11-13 DIAGNOSIS — M6281 Muscle weakness (generalized): Secondary | ICD-10-CM

## 2016-11-13 NOTE — Therapy (Signed)
[facility] [address] [place], [place], [address] Phone: [phone]   Fax:  [phone]  Physical Therapy Treatment  Patient Details  Name: [name] MRN: [id number] Date of Birth: [birth year] Referring Provider: Doneen Poissonhristopher Blackman, MD  Encounter Date: 11/13/2016      PT End of Session - 11/13/16 1602    Visit Number 5   Number of Visits 24   Date for PT Re-Evaluation 12/28/16   Authorization Type Medicare BCBS   Authorization Time Period KX visit 15   PT Start Time 0334   PT Stop Time 0424   PT Time Calculation (min) 50 min   Activity Tolerance Patient tolerated treatment well;Patient limited by pain   Behavior During Therapy [facility] for tasks assessed/performed      Past Medical History:  Diagnosis Date  . Arthritis   . Asthma    as a child   . Carpal tunnel syndrome    hx of   . Dementia   . Eczema   . High cholesterol   . High cholesterol   . Hypertension   . Psoriasis     Past Surgical History:  Procedure Laterality Date  . HAND SURGERY     Carpal tunnel  . KNEE SURGERY    . SHOULDER SURGERY    . TONSILLECTOMY    . TOTAL KNEE ARTHROPLASTY Left 08/31/2016   Procedure: LEFT TOTAL KNEE ARTHROPLASTY;  Surgeon: Kathryne Hitchhristopher Y Blackman, MD;  Location: [facility];  Service: Orthopedics;  Laterality: Left;    There were no vitals filed for this visit.      Subjective Assessment - 11/13/16 1547    Subjective Mild to moderate pain . I have been stretching my leg 3-4 times per day   Pertinent History Alzheimers    Currently in Pain? Yes   Pain Score 4    Pain Location Knee   Pain Orientation Left;Anterior   Pain Descriptors / Indicators Aching   Pain Type Surgical pain   Pain Onset More than a month ago   Pain Frequency Constant   Aggravating Factors  stretching   Pain Relieving Factors meds   Multiple Pain Sites --  Lt hip pain            [facility] PT Assessment - 11/13/16 0001      AROM   Left Knee Flexion 85  90 degrees post stretching     PROM   Left Knee Flexion 92                     [facility] Adult PT Treatment/Exercise - 11/13/16 0001      Neuro Re-ed    Neuro Re-ed Details  worked on flexion with walking and after some demo and cuing she was flexing Lt knee further on swing phase LT     Knee/Hip Exercises: Stretches   Other Knee/Hip Stretches seated flexion and extension to max of her ability     Knee/Hip Exercises: Aerobic   Nustep L3 LE only 9 min for ROM     Knee/Hip Exercises: Standing   Wall Squat 15 reps   Wall Squat Limitations cued not lower too much due to pain  and ability to rise from sqat   Other Standing Knee Exercises sidestepping x 40 feet RT/LT. She reported lateral RT hip pain today and Lt hip pain was less afer this,  minisquats x 20 at counter.    Other Standing Knee Exercises knee flexion x 20 reps  RT/LT      Knee/Hip Exercises: Seated   Long Arc Quad Left   Long Arc Quad Limitations 30   5-8 reps between stretching into flexion     Knee/Hip Exercises: Supine   Quad Sets Left   Quad Sets Limitations with leg extended  and gentle over pressure form PT  40 reps   Heel Slides Left   Heel Slides Limitations 30 reps     Manual Therapy   Manual therapy comments flexion end range with osccilations to facilitate felxion ROM and was at 93 degrees passive after.    Joint Mobilization posterior and anterior glides grade 3-4 and passive ROM flexion and extension, patella mobs all planes and scar mobs emphasis on flexion today also with osscilations at end range flexion                    PT Short Term Goals - 11/13/16 1636      PT SHORT TERM GOAL #1   Title She will be independent with inital HEP   Baseline She appears to be doing flexion stretch at least   Status On-going     PT SHORT TERM GOAL #2   Title She will improve active extension to -10 degrees   Status On-going     PT SHORT TERM GOAL #3   Title she will  have active LT knee flexion to 100 degrees   Status On-going     PT SHORT TERM GOAL #4   Title she will report able to have pant leg on skin due to decr sensitivity   Status On-going           PT Long Term Goals - 10/22/16 1600      PT LONG TERM GOAL #1   Title She will be independent with all HEP issued   Time 10   Period Weeks   Status New     PT LONG TERM GOAL #2   Title She will walk 100% without device in ans out of home   Time 10   Period Weeks   Status New     PT LONG TERM GOAL #3   Title she will have 125 degrees active flexion to improve gait    Time 10   Period Weeks   Status New     PT LONG TERM GOAL #4   Title She will be able to stand min 10 sec on each leg to demo good balance for independnece in community   Time 10   Period Weeks   Status New     PT LONG TERM GOAL #5   Title She and spouse will report return to normal activity in home    Time 10   Period Weeks   Status New     Additional Long Term Goals   Additional Long Term Goals Yes     PT LONG TERM GOAL #6   Title She will be able to walk 12 steps up and down step ove rstep safely to be ale to access upper floor at home safely.   Time 12   Period Weeks   Status New     PT LONG TERM GOAL #7   Title She will report 1-2 max pain with normal home activity   Time 12   Period Weeks   Status New               Plan - 11/13/16 1603    Clinical Impression Statement She  was able to tolerate the stretching today with minimal complaint. Encouraged her to increase stretch a little  more into flexion at home.    Clinical Impairments Affecting Rehab Potential alzhemers   PT Treatment/Interventions Cryotherapy;Electrical Stimulation;Moist Heat;Patient/family education;Passive range of motion;Stair training;Gait training;Taping;Manual techniques;Therapeutic exercise   PT Next Visit Plan Continue ROM and strengthening as tolearted . May emphasis extenison  next visit   PT Home Exercise Plan  flexion stretching   Consulted and Agree with Plan of Care Patient      Patient will benefit from skilled therapeutic intervention in order to improve the following deficits and impairments:  Difficulty walking, Abnormal gait, Decreased range of motion, Pain, Increased edema, Decreased strength, Decreased activity tolerance, Decreased balance, Impaired sensation  Visit Diagnosis: History of total knee arthroplasty, left  Stiffness of left knee, not elsewhere classified  Muscle weakness (generalized)  Acute pain of left knee  Localized edema  Difficulty in walking, not elsewhere classified     Problem List Patient Active Problem List   Diagnosis Date Noted  . Unilateral primary osteoarthritis, left knee 08/31/2016  . Status post total left knee replacement 08/31/2016  . Major neurocognitive disorder 11/27/2015  . Alzheimer's dementia 11/02/2015  . Vertigo 05/02/2015  . Subacute confusional state 05/02/2015  . Acute encephalopathy   . Memory deficit   . Hyperlipidemia   . Prediabetes   . TIA (transient ischemic attack) 04/16/2015    Caprice Red  PT 11/13/2016, 5:34 PM  [facility] [address] [place], [place], [address] Phone: [phone]   Fax:  [phone]  Name: [name] MRN: [id number] Date of Birth: 03-Mar-1951

## 2016-11-15 ENCOUNTER — Ambulatory Visit: Payer: Medicare Other | Admitting: Physical Therapy

## 2016-11-15 ENCOUNTER — Encounter: Payer: Self-pay | Admitting: Physical Therapy

## 2016-11-15 DIAGNOSIS — R600 Localized edema: Secondary | ICD-10-CM

## 2016-11-15 DIAGNOSIS — R262 Difficulty in walking, not elsewhere classified: Secondary | ICD-10-CM

## 2016-11-15 DIAGNOSIS — Z96652 Presence of left artificial knee joint: Secondary | ICD-10-CM

## 2016-11-15 DIAGNOSIS — M6281 Muscle weakness (generalized): Secondary | ICD-10-CM

## 2016-11-15 DIAGNOSIS — M25662 Stiffness of left knee, not elsewhere classified: Secondary | ICD-10-CM

## 2016-11-15 DIAGNOSIS — M25562 Pain in left knee: Secondary | ICD-10-CM

## 2016-11-15 NOTE — Therapy (Signed)
[place], [place], [address] Phone: [phone]   Fax:  [phone]  Physical Therapy Treatment  Patient Details  Name: [name] MRN: [id number] Date of Birth: June 22, 1951 Referring Provider: Jean Rosenthal, MD  Encounter Date: 11/15/2016      PT End of Session - 11/15/16 1814    Visit Number 6   Number of Visits 24   Date for PT Re-Evaluation 12/28/16   PT Start Time 0018   PT Stop Time 1630   PT Time Calculation (min) 41 min   Activity Tolerance Patient tolerated treatment well   Behavior During Therapy [facility] for tasks assessed/performed      Past Medical History:  Diagnosis Date  . Arthritis   . Asthma    as a child   . Carpal tunnel syndrome    hx of   . Dementia   . Eczema   . High cholesterol   . High cholesterol   . Hypertension   . Psoriasis     Past Surgical History:  Procedure Laterality Date  . HAND SURGERY     Carpal tunnel  . KNEE SURGERY    . SHOULDER SURGERY    . TONSILLECTOMY    . TOTAL KNEE ARTHROPLASTY Left 08/31/2016   Procedure: LEFT TOTAL KNEE ARTHROPLASTY;  Surgeon: Mcarthur Rossetti, MD;  Location: [facility];  Service: Orthopedics;  Laterality: Left;    There were no vitals filed for this visit.      Subjective Assessment - 11/15/16 1557    Subjective I have been stretching.  i want it to feel better.   Currently in Pain? Yes   Pain Score 0-No pain   Pain Location Knee   Pain Orientation Left;Anterior   Pain Descriptors / Indicators Aching   Pain Type Surgical pain   Pain Frequency Constant   Aggravating Factors  stretching    Pain Relieving Factors ice as needed            [facility] PT Assessment - 11/15/16 0001      PROM   Left Knee Extension -14                     [facility] Adult PT Treatment/Exercise - 11/15/16 0001      Knee/Hip Exercises: Aerobic   Nustep L3 LE only 9 min for ROM     Knee/Hip Exercises: Standing    Forward Step Up Both;1 set;10 reps;Hand Hold: 2;Step Height: 6";Limitations   Forward Step Up Limitations cues   Other Standing Knee Exercises terminal knee extension  with ball press into wall 10 x with cues   Other Standing Knee Exercises wall sliders pre gait      Knee/Hip Exercises: Seated   Long Arc Quad Left   Long Arc Quad Limitations 10     Knee/Hip Exercises: Supine   Quad Sets Left   Quad Sets Limitations 10   Short Arc Quad Sets Limitations 6 x 5 LBS   Straight Leg Raises 1 set;10 reps   Straight Leg Raises Limitations AA last few due to groin strain.     Moist Heat Therapy   Number Minutes Moist Heat 10 Minutes   Moist Heat Location --  posterior knee concurrent with stretches and supine exercise     Manual Therapy   Manual Therapy Soft tissue mobilization;Passive ROM   Joint Mobilization rotation tibia to increase extension passive stretches  PT Short Term Goals - 11/13/16 1636      PT SHORT TERM GOAL #1   Title She will be independent with inital HEP   Baseline She appears to be doing flexion stretch at least   Status On-going     PT SHORT TERM GOAL #2   Title She will improve active extension to -10 degrees   Status On-going     PT SHORT TERM GOAL #3   Title she will have active LT knee flexion to 100 degrees   Status On-going     PT SHORT TERM GOAL #4   Title she will report able to have pant leg on skin due to decr sensitivity   Status On-going           PT Long Term Goals - 10/22/16 1600      PT LONG TERM GOAL #1   Title She will be independent with all HEP issued   Time 10   Period Weeks   Status New     PT LONG TERM GOAL #2   Title She will walk 100% without device in ans out of home   Time 10   Period Weeks   Status New     PT LONG TERM GOAL #3   Title she will have 125 degrees active flexion to improve gait    Time 10   Period Weeks   Status New     PT LONG TERM GOAL #4   Title She will be able  to stand min 10 sec on each leg to demo good balance for independnece in community   Time 10   Period Weeks   Status New     PT LONG TERM GOAL #5   Title She and spouse will report return to normal activity in home    Time 10   Period Weeks   Status New     Additional Long Term Goals   Additional Long Term Goals Yes     PT LONG TERM GOAL #6   Title She will be able to walk 12 steps up and down step ove rstep safely to be ale to access upper floor at home safely.   Time 12   Period Weeks   Status New     PT LONG TERM GOAL #7   Title She will report 1-2 max pain with normal home activity   Time 12   Period Weeks   Status New               Plan - 11/15/16 1814    Clinical Impression Statement -14 Knee extension.  EXtension focus today.  Pain unchanged at end of session.  No new goals met.     PT Next Visit Plan Continue ROM and strengthening as tolearted .    PT Home Exercise Plan flexion stretching   Consulted and Agree with Plan of Care Patient      Patient will benefit from skilled therapeutic intervention in order to improve the following deficits and impairments:  Difficulty walking, Abnormal gait, Decreased range of motion, Pain, Increased edema, Decreased strength, Decreased activity tolerance, Decreased balance, Impaired sensation  Visit Diagnosis: History of total knee arthroplasty, left  Stiffness of left knee, not elsewhere classified  Muscle weakness (generalized)  Acute pain of left knee  Localized edema  Difficulty in walking, not elsewhere classified     Problem List Patient Active Problem List   Diagnosis Date Noted  . Unilateral primary osteoarthritis, left knee  08/31/2016  . Status post total left knee replacement 08/31/2016  . Major neurocognitive disorder 11/27/2015  . Alzheimer's dementia 11/02/2015  . Vertigo 05/02/2015  . Subacute confusional state 05/02/2015  . Acute encephalopathy   . Memory deficit   . Hyperlipidemia   .  Prediabetes   . TIA (transient ischemic attack) 04/16/2015    HARRIS,KAREN PTA 11/15/2016, 6:17 PM  [place], [place], [address] Phone: [phone]   Fax:  [phone]  Name: [name] MRN: [id number] Date of Birth: [birth year]

## 2016-11-20 ENCOUNTER — Encounter: Payer: Self-pay | Admitting: Physical Therapy

## 2016-11-20 ENCOUNTER — Ambulatory Visit: Payer: Medicare Other | Attending: Orthopaedic Surgery | Admitting: Physical Therapy

## 2016-11-20 DIAGNOSIS — Z96652 Presence of left artificial knee joint: Secondary | ICD-10-CM | POA: Insufficient documentation

## 2016-11-20 DIAGNOSIS — M6281 Muscle weakness (generalized): Secondary | ICD-10-CM | POA: Diagnosis not present

## 2016-11-20 DIAGNOSIS — M25562 Pain in left knee: Secondary | ICD-10-CM | POA: Insufficient documentation

## 2016-11-20 DIAGNOSIS — R600 Localized edema: Secondary | ICD-10-CM | POA: Insufficient documentation

## 2016-11-20 DIAGNOSIS — R262 Difficulty in walking, not elsewhere classified: Secondary | ICD-10-CM | POA: Insufficient documentation

## 2016-11-20 DIAGNOSIS — M25662 Stiffness of left knee, not elsewhere classified: Secondary | ICD-10-CM | POA: Diagnosis not present

## 2016-11-20 NOTE — Therapy (Signed)
[facility] Outpatient Rehabilitation [facility] [address] [place], [place], [address] Phone: [phone]   Fax:  [phone]  Physical Therapy Treatment  Patient Details  Name: [name] MRN: [id number] Date of Birth: [birth year] Referring Provider: Doneen Poisson, MD  Encounter Date: 11/20/2016      PT End of Session - 11/20/16 1810    Visit Number 7   Number of Visits 24   Date for PT Re-Evaluation 12/28/16   PT Start Time 1505   PT Stop Time 1545   PT Time Calculation (min) 40 min   Activity Tolerance Patient tolerated treatment well   Behavior During Therapy [facility] for tasks assessed/performed      Past Medical History:  Diagnosis Date  . Arthritis   . Asthma    as a child   . Carpal tunnel syndrome    hx of   . Dementia   . Eczema   . High cholesterol   . High cholesterol   . Hypertension   . Psoriasis     Past Surgical History:  Procedure Laterality Date  . HAND SURGERY     Carpal tunnel  . KNEE SURGERY    . SHOULDER SURGERY    . TONSILLECTOMY    . TOTAL KNEE ARTHROPLASTY Left 08/31/2016   Procedure: LEFT TOTAL KNEE ARTHROPLASTY;  Surgeon: Kathryne Hitch, MD;  Location: [facility];  Service: Orthopedics;  Laterality: Left;    There were no vitals filed for this visit.      Subjective Assessment - 11/20/16 1511    Subjective No pain .  I have a machine like this at home.  Iuse it about 30 minutes a day.   Patient is accompained by: Family member  in [place]   Currently in Pain? No/denies   Pain Location Knee   Pain Orientation Left                         [facility] Adult PT Treatment/Exercise - 11/20/16 0001      Knee/Hip Exercises: Aerobic   Nustep L4 LE only 5 minutes     Knee/Hip Exercises: Standing   Other Standing Knee Exercises wall sliders pre gait      Knee/Hip Exercises: Supine   Heel Slides 10 reps;2 sets  1 set with strap     Manual Therapy   Manual Therapy Passive ROM   Joint  Mobilization Strap mobilization for flexion    Soft tissue mobilization lateral knee,  scar mobilization distai 1/3 ,  retrograde    Passive ROM flexion  desentization scar                  PT Short Term Goals - 11/20/16 1812      PT SHORT TERM GOAL #1   Title She will be independent with inital HEP   Baseline Notes she has a nustep type machine at home and uses    Time 4   Period Weeks   Status On-going     PT SHORT TERM GOAL #2   Title She will improve active extension to -10 degrees   Time 4   Period Weeks   Status Unable to assess     PT SHORT TERM GOAL #3   Baseline PROM 96   Time 4   Period Weeks   Status On-going     PT SHORT TERM GOAL #4   Title she will report able to have pant leg on skin due  to decr sensitivity   Baseline still sensitive   Time 4   Period Weeks   Status On-going           PT Long Term Goals - 10/22/16 1600      PT LONG TERM GOAL #1   Title She will be independent with all HEP issued   Time 10   Period Weeks   Status New     PT LONG TERM GOAL #2   Title She will walk 100% without device in ans out of home   Time 10   Period Weeks   Status New     PT LONG TERM GOAL #3   Title she will have 125 degrees active flexion to improve gait    Time 10   Period Weeks   Status New     PT LONG TERM GOAL #4   Title She will be able to stand min 10 sec on each leg to demo good balance for independnece in community   Time 10   Period Weeks   Status New     PT LONG TERM GOAL #5   Title She and spouse will report return to normal activity in home    Time 10   Period Weeks   Status New     Additional Long Term Goals   Additional Long Term Goals Yes     PT LONG TERM GOAL #6   Title She will be able to walk 12 steps up and down step ove rstep safely to be ale to access upper floor at home safely.   Time 12   Period Weeks   Status New     PT LONG TERM GOAL #7   Title She will report 1-2 max pain with normal home activity    Time 12   Period Weeks   Status New               Plan - 11/20/16 1810    Clinical Impression Statement PROM 96 flexion.  treatment consisted of exercise  and manual techniques.  No pain at end of session.  Progress toward ROM goal.   PT Next Visit Plan Continue ROM and strengthening as tolearted .    PT Home Exercise Plan flexion stretching,  shoulder retraction.   Consulted and Agree with Plan of Care Patient      Patient will benefit from skilled therapeutic intervention in order to improve the following deficits and impairments:  Difficulty walking, Abnormal gait, Decreased range of motion, Pain, Increased edema, Decreased strength, Decreased activity tolerance, Decreased balance, Impaired sensation  Visit Diagnosis: History of total knee arthroplasty, left  Stiffness of left knee, not elsewhere classified  Muscle weakness (generalized)  Acute pain of left knee  Localized edema  Difficulty in walking, not elsewhere classified     Problem List Patient Active Problem List   Diagnosis Date Noted  . Unilateral primary osteoarthritis, left knee 08/31/2016  . Status post total left knee replacement 08/31/2016  . Major neurocognitive disorder 11/27/2015  . Alzheimer's dementia 11/02/2015  . Vertigo 05/02/2015  . Subacute confusional state 05/02/2015  . Acute encephalopathy   . Memory deficit   . Hyperlipidemia   . Prediabetes   . TIA (transient ischemic attack) 04/16/2015    HARRIS,KAREN  PTA 11/20/2016, 6:15 PM  [facility] [address] [place], [place], [address] Phone: [phone]   Fax:  [phone]  Name: [name] MRN: [id number] Date of Birth: 07/20/1951

## 2016-11-22 ENCOUNTER — Ambulatory Visit: Payer: Medicare Other

## 2016-11-22 DIAGNOSIS — R262 Difficulty in walking, not elsewhere classified: Secondary | ICD-10-CM

## 2016-11-22 DIAGNOSIS — Z96652 Presence of left artificial knee joint: Secondary | ICD-10-CM

## 2016-11-22 DIAGNOSIS — M25562 Pain in left knee: Secondary | ICD-10-CM

## 2016-11-22 DIAGNOSIS — M25662 Stiffness of left knee, not elsewhere classified: Secondary | ICD-10-CM

## 2016-11-22 DIAGNOSIS — R600 Localized edema: Secondary | ICD-10-CM

## 2016-11-22 DIAGNOSIS — M6281 Muscle weakness (generalized): Secondary | ICD-10-CM

## 2016-11-22 NOTE — Therapy (Signed)
[facility] Outpatient Rehabilitation [facility] [address] [place], [place], [address] Phone: [phone]   Fax:  [phone]  Physical Therapy Treatment  Patient Details  Name: [name] MRN: [id number] Date of Birth: March 12, 1951 Referring Provider: Doneen Poisson, MD  Encounter Date: 11/22/2016      PT End of Session - 11/22/16 1544    Visit Number 8   Number of Visits 24   Date for PT Re-Evaluation 12/28/16   Authorization Type Medicare BCBS   Authorization Time Period KX visit 15   PT Start Time 0300   PT Stop Time 0344   PT Time Calculation (min) 44 min   Activity Tolerance Patient tolerated treatment well   Behavior During Therapy [facility] for tasks assessed/performed      Past Medical History:  Diagnosis Date  . Arthritis   . Asthma    as a child   . Carpal tunnel syndrome    hx of   . Dementia   . Eczema   . High cholesterol   . High cholesterol   . Hypertension   . Psoriasis     Past Surgical History:  Procedure Laterality Date  . HAND SURGERY     Carpal tunnel  . KNEE SURGERY    . SHOULDER SURGERY    . TONSILLECTOMY    . TOTAL KNEE ARTHROPLASTY Left 08/31/2016   Procedure: LEFT TOTAL KNEE ARTHROPLASTY;  Surgeon: Kathryne Hitch, MD;  Location: [facility];  Service: Orthopedics;  Laterality: Left;    There were no vitals filed for this visit.                       [facility] Adult PT Treatment/Exercise - 11/22/16 0001      Ambulation/Gait   Gait Comments Walking with cues and effort to increase flexion with swing and then di stairs step over step 4 and 6 inch steps  2 rails. for safety. close supervised      Knee/Hip Exercises: Aerobic   Nustep L4 LE only 6 minutes     Knee/Hip Exercises: Supine   Quad Sets Left   Quad Sets Limitations 5-10 sec 20 reps with stretching     Manual Therapy   Passive ROM Flexion and extension with flexion emphasized sustained and wih posterior glides tibia on fibula with  and without osscilations . also patella mobs all directions.  and STW to quad and around patella.                   PT Short Term Goals - 11/20/16 1812      PT SHORT TERM GOAL #1   Title She will be independent with inital HEP   Baseline Notes she has a nustep type machine at home and uses    Time 4   Period Weeks   Status On-going     PT SHORT TERM GOAL #2   Title She will improve active extension to -10 degrees   Time 4   Period Weeks   Status Unable to assess     PT SHORT TERM GOAL #3   Baseline PROM 96   Time 4   Period Weeks   Status On-going     PT SHORT TERM GOAL #4   Title she will report able to have pant leg on skin due to decr sensitivity   Baseline still sensitive   Time 4   Period Weeks   Status On-going  PT Long Term Goals - 10/22/16 1600      PT LONG TERM GOAL #1   Title She will be independent with all HEP issued   Time 10   Period Weeks   Status New     PT LONG TERM GOAL #2   Title She will walk 100% without device in ans out of home   Time 10   Period Weeks   Status New     PT LONG TERM GOAL #3   Title she will have 125 degrees active flexion to improve gait    Time 10   Period Weeks   Status New     PT LONG TERM GOAL #4   Title She will be able to stand min 10 sec on each leg to demo good balance for independnece in community   Time 10   Period Weeks   Status New     PT LONG TERM GOAL #5   Title She and spouse will report return to normal activity in home    Time 10   Period Weeks   Status New     Additional Long Term Goals   Additional Long Term Goals Yes     PT LONG TERM GOAL #6   Title She will be able to walk 12 steps up and down step ove rstep safely to be ale to access upper floor at home safely.   Time 12   Period Weeks   Status New     PT LONG TERM GOAL #7   Title She will report 1-2 max pain with normal home activity   Time 12   Period Weeks   Status New               Plan -  11/22/16 1544    Clinical Impression Statement Complaind of pain with stretching but otherwise she was tolerance to all activity. and declined modalities.  She is bending knee more with swing phase though does better with cues, She did well on steps but definitely needs rails for safety   PT Treatment/Interventions Cryotherapy;Electrical Stimulation;Moist Heat;Patient/family education;Passive range of motion;Stair training;Gait training;Taping;Manual techniques;Therapeutic exercise   PT Next Visit Plan Continue ROM and strengthening as tolearted .    PT Home Exercise Plan flexion stretching,  shoulder retraction.   Consulted and Agree with Plan of Care Patient      Patient will benefit from skilled therapeutic intervention in order to improve the following deficits and impairments:  Difficulty walking, Abnormal gait, Decreased range of motion, Pain, Increased edema, Decreased strength, Decreased activity tolerance, Decreased balance, Impaired sensation  Visit Diagnosis: History of total knee arthroplasty, left  Stiffness of left knee, not elsewhere classified  Muscle weakness (generalized)  Acute pain of left knee  Localized edema  Difficulty in walking, not elsewhere classified     Problem List Patient Active Problem List   Diagnosis Date Noted  . Unilateral primary osteoarthritis, left knee 08/31/2016  . Status post total left knee replacement 08/31/2016  . Major neurocognitive disorder 11/27/2015  . Alzheimer's dementia 11/02/2015  . Vertigo 05/02/2015  . Subacute confusional state 05/02/2015  . Acute encephalopathy   . Memory deficit   . Hyperlipidemia   . Prediabetes   . TIA (transient ischemic attack) 04/16/2015    Caprice Red  PT 11/22/2016, 3:48 PM  [facility] [address] [place], [place], [address] Phone: [phone]   Fax:  [phone]  Name: [name] MRN: [id number]  Date of Birth:  [birth year]

## 2016-11-27 ENCOUNTER — Ambulatory Visit: Payer: Medicare Other

## 2016-11-27 DIAGNOSIS — M25562 Pain in left knee: Secondary | ICD-10-CM | POA: Diagnosis not present

## 2016-11-27 DIAGNOSIS — Z96652 Presence of left artificial knee joint: Secondary | ICD-10-CM | POA: Diagnosis not present

## 2016-11-27 DIAGNOSIS — R600 Localized edema: Secondary | ICD-10-CM

## 2016-11-27 DIAGNOSIS — M6281 Muscle weakness (generalized): Secondary | ICD-10-CM

## 2016-11-27 DIAGNOSIS — M25662 Stiffness of left knee, not elsewhere classified: Secondary | ICD-10-CM | POA: Diagnosis not present

## 2016-11-27 DIAGNOSIS — R262 Difficulty in walking, not elsewhere classified: Secondary | ICD-10-CM | POA: Diagnosis not present

## 2016-11-27 NOTE — Therapy (Signed)
[facility] Outpatient Rehabilitation [facility] [address] [place], [place], [address] Phone: [phone]   Fax:  [phone]  Physical Therapy Treatment  Patient Details  Name: [name] MRN: [id number] Date of Birth: [birth year] Referring Provider: Doneen Poisson, MD  Encounter Date: 11/27/2016      PT End of Session - 11/27/16 1606    Visit Number 9   Number of Visits 24   Date for PT Re-Evaluation 12/28/16   Authorization Type Medicare BCBS   Authorization Time Period KX visit 15   PT Start Time 0340   PT Stop Time 0430   PT Time Calculation (min) 50 min   Activity Tolerance Patient tolerated treatment well;Patient limited by pain   Behavior During Therapy [facility] for tasks assessed/performed      Past Medical History:  Diagnosis Date  . Arthritis   . Asthma    as a child   . Carpal tunnel syndrome    hx of   . Dementia   . Eczema   . High cholesterol   . High cholesterol   . Hypertension   . Psoriasis     Past Surgical History:  Procedure Laterality Date  . HAND SURGERY     Carpal tunnel  . KNEE SURGERY    . SHOULDER SURGERY    . TONSILLECTOMY    . TOTAL KNEE ARTHROPLASTY Left 08/31/2016   Procedure: LEFT TOTAL KNEE ARTHROPLASTY;  Surgeon: Yvette Hitch, MD;  Location: [facility];  Service: Orthopedics;  Laterality: Left;    There were no vitals filed for this visit.          [facility] PT Assessment - 11/27/16 0001      AROM   Left Knee Extension -24   Left Knee Flexion 90  quite painful     PROM   Left Knee Extension -18   Left Knee Flexion 92  very painful     Strength   Right Knee Extension 5/5   Left Knee Flexion 4+/5   Left Knee Extension 4+/5  painful     Palpation   Patella mobility improved but still decreased   Palpation comment Tenderness as improved but she still  keeps pant leg off  skin.                      [facility] Adult PT Treatment/Exercise - 11/27/16 0001      Knee/Hip  Exercises: Stretches   Other Knee/Hip Stretches Prone knee hang x 5 min with stretchcontract relax hamstrings and DF stretchihging and      Knee/Hip Exercises: Aerobic   Nustep L4 LE only 7 minutes     Knee/Hip Exercises: Supine   Quad Sets Left;20 reps   Quad Sets Limitations 5 sec 3 sets and after stretching 15 reps    Short Arc Quad Sets Limitations 20 reps     Manual Therapy   Soft tissue mobilization STW  to scar and quadraceps LT   Passive ROM Flexion and extension with flexion emphasized sustained and wih posterior glides tibia on tibia with and without osscilations . also patella mobs all directions.  and STW to quad and around patella.                   PT Short Term Goals - 11/27/16 1632      PT SHORT TERM GOAL #1   Title She will be independent with inital HEP   Baseline Notes she has a nustep type  machine at home and uses    Status On-going     PT SHORT TERM GOAL #2   Title She will improve active extension to -10 degrees   Baseline -24 degrees   Status On-going     PT SHORT TERM GOAL #3   Title she will have active LT knee flexion to 100 degrees   Baseline 90 today   Status On-going     PT SHORT TERM GOAL #4   Title she will report able to have pant leg on skin due to decr sensitivity   Baseline still sensitive   Status On-going           PT Long Term Goals - 10/22/16 1600      PT LONG TERM GOAL #1   Title She will be independent with all HEP issued   Time 10   Period Weeks   Status New     PT LONG TERM GOAL #2   Title She will walk 100% without device in ans out of home   Time 10   Period Weeks   Status New     PT LONG TERM GOAL #3   Title she will have 125 degrees active flexion to improve gait    Time 10   Period Weeks   Status New     PT LONG TERM GOAL #4   Title She will be able to stand min 10 sec on each leg to demo good balance for independnece in community   Time 10   Period Weeks   Status New     PT LONG TERM GOAL  #5   Title She and spouse will report return to normal activity in home    Time 10   Period Weeks   Status New     Additional Long Term Goals   Additional Long Term Goals Yes     PT LONG TERM GOAL #6   Title She will be able to walk 12 steps up and down step ove rstep safely to be ale to access upper floor at home safely.   Time 12   Period Weeks   Status New     PT LONG TERM GOAL #7   Title She will report 1-2 max pain with normal home activity   Time 12   Period Weeks   Status New               Plan - 11/27/16 1606    Clinical Impression Statement She is improved from eval but ROM has been very slow to gain. Will plan on continuing  strengthn  and push ROM as tolerated   PT Treatment/Interventions Cryotherapy;Electrical Stimulation;Moist Heat;Patient/family education;Passive range of motion;Stair training;Gait training;Taping;Manual techniques;Therapeutic exercise   PT Next Visit Plan Continue ROM and strengthening as tolearted .       FOTO   PT Home Exercise Plan flexion stretching,  shoulder retraction. extension with QS and SAQ          Patient will benefit from skilled therapeutic intervention in order to improve the following deficits and impairments:  Difficulty walking, Abnormal gait, Decreased range of motion, Pain, Increased edema, Decreased strength, Decreased activity tolerance, Decreased balance, Impaired sensation  Visit Diagnosis: History of total knee arthroplasty, left  Stiffness of left knee, not elsewhere classified  Muscle weakness (generalized)  Acute pain of left knee  Localized edema  Difficulty in walking, not elsewhere classified     Problem List Patient Active Problem List   Diagnosis  Date Noted  . Unilateral primary osteoarthritis, left knee 08/31/2016  . Status post total left knee replacement 08/31/2016  . Major neurocognitive disorder 11/27/2015  . Alzheimer's dementia 11/02/2015  . Vertigo 05/02/2015  . Subacute  confusional state 05/02/2015  . Acute encephalopathy   . Memory deficit   . Hyperlipidemia   . Prediabetes   . TIA (transient ischemic attack) 04/16/2015    [name]  PT 11/27/2016, 4:35 PM  [facility] [address] [place], [place], [address] Phone: [phone]   Fax:  [phone]  Name: [name] MRN: [id number] Date of Birth: [birth year]

## 2016-11-28 ENCOUNTER — Ambulatory Visit (INDEPENDENT_AMBULATORY_CARE_PROVIDER_SITE_OTHER): Payer: Medicare Other

## 2016-11-28 ENCOUNTER — Ambulatory Visit (INDEPENDENT_AMBULATORY_CARE_PROVIDER_SITE_OTHER): Payer: Medicare Other | Admitting: Physician Assistant

## 2016-11-28 DIAGNOSIS — Z96652 Presence of left artificial knee joint: Secondary | ICD-10-CM

## 2016-11-28 MED ORDER — METHOCARBAMOL 500 MG PO TABS: 500.0000 mg | ORAL_TABLET | Freq: Four times a day (QID) | ORAL | 0 refills | Status: DC | PRN

## 2016-11-28 MED ORDER — TEMAZEPAM 7.5 MG PO CAPS: 7.5000 mg | ORAL_CAPSULE | Freq: Every evening | ORAL | 0 refills | Status: DC | PRN

## 2016-11-28 NOTE — Progress Notes (Signed)
[name] returns today almost 3 months status post left total knee arthroplasty. She states that she is not very happy with the knee as it night the metal knocks together in her knee and moves around. She states she's had very little sleep since the surgery. She continues to work with physical therapy for range of motion strengthening knee. Again she does have some dementia.  Physical exam left knee she has full extension flexion to 95. No instability with valgus varus stressing. Surgical incisions healing well no signs of infection  Left knee 2 views AP and lateral: No acute fracture. Well-seated components without any signs of loosening or hardware failure.  Plan: Send and Restoril referred to try at night for sleep. Have her follow with [place] in 3 months check progress lack of. Would not recommend any type of manipulation as she's bending at least to 95 now. Continue working with therapy for range of motion strengthening knee.

## 2016-11-29 ENCOUNTER — Ambulatory Visit: Payer: Medicare Other

## 2016-11-29 ENCOUNTER — Telehealth: Payer: Self-pay

## 2016-11-29 NOTE — Telephone Encounter (Signed)
Spoke with daughter after leaving message on pt phone and the daughter reported that there was a family funeral today and the appointment may have been forgotten. She stated she would remind pt and husband about appointment next week.

## 2016-12-04 ENCOUNTER — Ambulatory Visit: Payer: Medicare Other

## 2016-12-04 DIAGNOSIS — M6281 Muscle weakness (generalized): Secondary | ICD-10-CM | POA: Diagnosis not present

## 2016-12-04 DIAGNOSIS — R600 Localized edema: Secondary | ICD-10-CM | POA: Diagnosis not present

## 2016-12-04 DIAGNOSIS — M25662 Stiffness of left knee, not elsewhere classified: Secondary | ICD-10-CM

## 2016-12-04 DIAGNOSIS — Z96652 Presence of left artificial knee joint: Secondary | ICD-10-CM

## 2016-12-04 DIAGNOSIS — R262 Difficulty in walking, not elsewhere classified: Secondary | ICD-10-CM

## 2016-12-04 DIAGNOSIS — M25562 Pain in left knee: Secondary | ICD-10-CM | POA: Diagnosis not present

## 2016-12-04 NOTE — Therapy (Addendum)
[facility] Outpatient Rehabilitation [facility] [address] [place], [place], [address] Phone: [phone]   Fax:  [phone]  Physical Therapy Treatment  Patient Details  Name: [name] MRN: [id number] Date of Birth: 1951/04/13 Referring Provider: Doneen Poisson, MD  Encounter Date: 12/04/2016      PT End of Session - 12/04/16 1555    Visit Number 10   Number of Visits 24   Date for PT Re-Evaluation 12/28/16   Authorization Type Medicare BCBS   Authorization Time Period KX visit 15   PT Start Time 0345   PT Stop Time 0430   PT Time Calculation (min) 45 min   Activity Tolerance Patient tolerated treatment well;Patient limited by pain   Behavior During Therapy [facility] for tasks assessed/performed      Past Medical History:  Diagnosis Date  . Arthritis   . Asthma    as a child   . Carpal tunnel syndrome    hx of   . Dementia   . Eczema   . High cholesterol   . High cholesterol   . Hypertension   . Psoriasis     Past Surgical History:  Procedure Laterality Date  . HAND SURGERY     Carpal tunnel  . KNEE SURGERY    . SHOULDER SURGERY    . TONSILLECTOMY    . TOTAL KNEE ARTHROPLASTY Left 08/31/2016   Procedure: LEFT TOTAL KNEE ARTHROPLASTY;  Surgeon: Kathryne Hitch, MD;  Location: [facility];  Service: Orthopedics;  Laterality: Left;    There were no vitals filed for this visit.                       [facility] Adult PT Treatment/Exercise - 12/04/16 0001      Knee/Hip Exercises: Aerobic   Nustep L5 LE only 7 minutes     Knee/Hip Exercises: Standing   Lateral Step Up 20 reps;Hand Hold: 1;Step Height: 8";Left     Knee/Hip Exercises: Seated   Long Arc Quad Left   Long Arc Quad Weight 5 lbs.   Long Arc Quad Limitations 50 reps      Manual Therapy   Joint Mobilization AP glides tibia on femur and patella all directions    Soft tissue mobilization STW  to scar and quadraceps LT   Passive ROM Flexion and extension  with flexion emphasized sustained and wih posterior glides tibia on tibia with and without osscilations . also patella mobs all directions.  and STW to quad and around patella.                   PT Short Term Goals - 11/27/16 1632      PT SHORT TERM GOAL #1   Title She will be independent with inital HEP   Baseline Notes she has a nustep type machine at home and uses    Status On-going     PT SHORT TERM GOAL #2   Title She will improve active extension to -10 degrees   Baseline -24 degrees   Status On-going     PT SHORT TERM GOAL #3   Title she will have active LT knee flexion to 100 degrees   Baseline 90 today   Status On-going     PT SHORT TERM GOAL #4   Title she will report able to have pant leg on skin due to decr sensitivity   Baseline still sensitive   Status On-going  PT Long Term Goals - 10/22/16 1600      PT LONG TERM GOAL #1   Title She will be independent with all HEP issued   Time 10   Period Weeks   Status New     PT LONG TERM GOAL #2   Title She will walk 100% without device in ans out of home   Time 10   Period Weeks   Status New     PT LONG TERM GOAL #3   Title she will have 125 degrees active flexion to improve gait    Time 10   Period Weeks   Status New     PT LONG TERM GOAL #4   Title She will be able to stand min 10 sec on each leg to demo good balance for independnece in community   Time 10   Period Weeks   Status New     PT LONG TERM GOAL #5   Title She and spouse will report return to normal activity in home    Time 10   Period Weeks   Status New     Additional Long Term Goals   Additional Long Term Goals Yes     PT LONG TERM GOAL #6   Title She will be able to walk 12 steps up and down step ove rstep safely to be ale to access upper floor at home safely.   Time 12   Period Weeks   Status New     PT LONG TERM GOAL #7   Title She will report 1-2 max pain with normal home activity   Time 12   Period  Weeks   Status New               Plan - 12/04/16 1623    Clinical Impression Statement She continues to have stiffness with flexion < 90 degrees today. We are able to stretch to 90 degrees + but this is not progressed since last visit though she did not come to last visit. Much less tenderness LT knee nostly at proxima quads and IT bacn and medial knee at tibial plateau   Clinical Impairments Affecting Rehab Potential alzheimers   PT Treatment/Interventions Cryotherapy;Electrical Stimulation;Moist Heat;Patient/family education;Passive range of motion;Stair training;Gait training;Taping;Manual techniques;Therapeutic exercise   PT Next Visit Plan Continue ROM and strengthening as tolearted .       FOTO   PT Home Exercise Plan flexion stretching,  shoulder retraction. extension with QS and SAQ       Consulted and Agree with Plan of Care Patient      Patient will benefit from skilled therapeutic intervention in order to improve the following deficits and impairments:  Difficulty walking, Abnormal gait, Decreased range of motion, Pain, Increased edema, Decreased strength, Decreased activity tolerance, Decreased balance, Impaired sensation  Visit Diagnosis: History of total knee arthroplasty, left  Stiffness of left knee, not elsewhere classified  Muscle weakness (generalized)  Acute pain of left knee  Localized edema  Difficulty in walking, not elsewhere classified G- Code  Current status ; CK   Goal status  CJ   Clinical judgement :   Mobility   Problem List Patient Active Problem List   Diagnosis Date Noted  . Unilateral primary osteoarthritis, left knee 08/31/2016  . Status post total left knee replacement 08/31/2016  . Major neurocognitive disorder 11/27/2015  . Alzheimer's dementia 11/02/2015  . Vertigo 05/02/2015  . Subacute confusional state 05/02/2015  . Acute encephalopathy   . Memory deficit   .  Hyperlipidemia   . Prediabetes   . TIA (transient ischemic  attack) 04/16/2015    Caprice Red  PT 12/04/2016, 4:32 PM  [facility] [address] [place], [place], [address] Phone: [phone]   Fax:  [phone]  Name: [name] MRN: [id number] Date of Birth: 02/12/1951

## 2016-12-06 ENCOUNTER — Ambulatory Visit: Payer: Medicare Other

## 2016-12-06 DIAGNOSIS — R600 Localized edema: Secondary | ICD-10-CM | POA: Diagnosis not present

## 2016-12-06 DIAGNOSIS — M25562 Pain in left knee: Secondary | ICD-10-CM | POA: Diagnosis not present

## 2016-12-06 DIAGNOSIS — R262 Difficulty in walking, not elsewhere classified: Secondary | ICD-10-CM

## 2016-12-06 DIAGNOSIS — M25662 Stiffness of left knee, not elsewhere classified: Secondary | ICD-10-CM | POA: Diagnosis not present

## 2016-12-06 DIAGNOSIS — Z96652 Presence of left artificial knee joint: Secondary | ICD-10-CM

## 2016-12-06 DIAGNOSIS — M6281 Muscle weakness (generalized): Secondary | ICD-10-CM | POA: Diagnosis not present

## 2016-12-06 NOTE — Therapy (Signed)
[facility] Outpatient Rehabilitation [facility] [address] [place], [place], [address] Phone: [phone]   Fax:  [phone]  Physical Therapy Treatment  Patient Details  Name: [name] MRN: [id number] Date of Birth: [birth year] Referring Provider: Doneen Poisson, MD  Encounter Date: 12/06/2016      PT End of Session - 12/06/16 1716    Visit Number 11   Number of Visits 24   Date for PT Re-Evaluation 12/28/16   Authorization Type Medicare BCBS   Authorization Time Period KX visit 15   PT Start Time 0430   PT Stop Time 0514   PT Time Calculation (min) 44 min   Activity Tolerance Patient tolerated treatment well;No increased pain   Behavior During Therapy WFL for tasks assessed/performed      Past Medical History:  Diagnosis Date  . Arthritis   . Asthma    as a child   . Carpal tunnel syndrome    hx of   . Dementia   . Eczema   . High cholesterol   . High cholesterol   . Hypertension   . Psoriasis     Past Surgical History:  Procedure Laterality Date  . HAND SURGERY     Carpal tunnel  . KNEE SURGERY    . SHOULDER SURGERY    . TONSILLECTOMY    . TOTAL KNEE ARTHROPLASTY Left 08/31/2016   Procedure: LEFT TOTAL KNEE ARTHROPLASTY;  Surgeon: Kathryne Hitch, MD;  Location: [facility];  Service: Orthopedics;  Laterality: Left;    There were no vitals filed for this visit.      Subjective Assessment - 12/06/16 1714    Subjective Knee sore and feels like things are floating around in there. Can't sleep well.    Currently in Pain? Yes   Pain Score 5    Pain Location Knee   Pain Orientation Left   Pain Descriptors / Indicators Aching   Pain Type Surgical pain   Pain Onset More than a month ago   Pain Frequency Constant   Aggravating Factors  Bending , touching    Pain Relieving Factors Rest    Multiple Pain Sites No            [facility] PT Assessment - 12/06/16 0001      AROM   Left Knee Extension -24   Left Knee  Flexion 90                     [facility] Adult PT Treatment/Exercise - 12/06/16 0001      Knee/Hip Exercises: Supine   Quad Sets Left;20 reps   Short Arc Quad Sets Left   Short Arc Quad Sets Limitations 80 reps in sets of 20 between set of STW with tool      Manual Therapy   Manual Therapy Taping   Joint Mobilization AP glides tibia on femur and patella all directions    Soft tissue mobilization STW  to  quadraceps LT  and lower leg for pain and swelling   Passive ROM extension stretching   Kinesiotex Edema     Kinesiotix   Edema 2 fans to LT thigh to knee and 2 short strips over 2 spots of tenderness     Nustep  L6  8 min LE Only           PT Education - 12/06/16 1747    Education provided Yes   Education Details Management of tape , Remove  if irritating skin of  in 3 days at latest. Informed tape can get wet   Person(s) Educated Patient   Methods Explanation   Comprehension Verbalized understanding          PT Short Term Goals - 12/06/16 1737      PT SHORT TERM GOAL #1   Title She will be independent with inital HEP   Baseline per husband   Status Achieved     PT SHORT TERM GOAL #2   Title She will improve active extension to -10 degrees   Baseline -24 degrees   Status On-going     PT SHORT TERM GOAL #3   Title she will have active LT knee flexion to 100 degrees   Baseline 90 today   Status On-going     PT SHORT TERM GOAL #4   Title she will report able to have pant leg on skin due to decr sensitivity   Baseline still sensitive but more tolerant to touch an pressure   Status On-going           PT Long Term Goals - 12/06/16 1738      PT LONG TERM GOAL #1   Title She will be independent with all HEP issued   Status On-going     PT LONG TERM GOAL #2   Title She will walk 100% without device in ans out of home   Status Achieved     PT LONG TERM GOAL #3   Title she will have 125 degrees active flexion to improve gait    Status  On-going     PT LONG TERM GOAL #4   Title She will be able to stand min 10 sec on each leg to demo good balance for independnece in community   Status On-going     PT LONG TERM GOAL #5   Title She and spouse will report return to normal activity in home    Status On-going     PT LONG TERM GOAL #6   Title She will be able to walk 12 steps up and down step ove rstep safely to be able to access upper floor at home safely.   Status On-going     PT LONG TERM GOAL #7   Title She will report 1-2 max pain with normal home activity   Status On-going               Plan - 12/06/16 1717    Clinical Impression Statement Today her pain was up and she complains about lack of sleep due to pain. She may have been more painful due to heavier stretching last visit.  She demo no loss of ROM and stated she was walking better with incr flexion  with swing.  WE treated with goal of decreased pain today and  extension stretching and DF stretching. She stated  her pain was some improved but still stender and 2 spots of intense pain medial tibial plateau and proximal 1/3 of scar.    PT Treatment/Interventions Cryotherapy;Electrical Stimulation;Moist Heat;Patient/family education;Passive range of motion;Stair training;Gait training;Taping;Manual techniques;Therapeutic exercise   PT Next Visit Plan Continue ROM and strengthening as tolearted .       FOTO next visit   PT Home Exercise Plan flexion stretching,  shoulder retraction. extension with QS and SAQ    taping   Consulted and Agree with Plan of Care Patient      Patient will benefit from skilled therapeutic intervention in order to improve the following deficits and impairments:  Difficulty walking, Abnormal gait, Decreased range of motion, Pain, Increased edema, Decreased strength, Decreased activity tolerance, Decreased balance, Impaired sensation  Visit Diagnosis: History of total knee arthroplasty, left  Stiffness of left knee, not elsewhere  classified  Muscle weakness (generalized)  Acute pain of left knee  Localized edema  Difficulty in walking, not elsewhere classified     Problem List Patient Active Problem List   Diagnosis Date Noted  . Unilateral primary osteoarthritis, left knee 08/31/2016  . Status post total left knee replacement 08/31/2016  . Major neurocognitive disorder 11/27/2015  . Alzheimer's dementia 11/02/2015  . Vertigo 05/02/2015  . Subacute confusional state 05/02/2015  . Acute encephalopathy   . Memory deficit   . Hyperlipidemia   . Prediabetes   . TIA (transient ischemic attack) 04/16/2015    Caprice Red  PT 12/06/2016, 6:05 PM  [facility] [address] [place], [place], [address] Phone: [phone]   Fax:  [phone]  Name: [name] MRN: [id number] Date of Birth: [birth year]

## 2016-12-10 ENCOUNTER — Ambulatory Visit: Payer: Medicare Other

## 2016-12-10 DIAGNOSIS — R600 Localized edema: Secondary | ICD-10-CM

## 2016-12-10 DIAGNOSIS — M25662 Stiffness of left knee, not elsewhere classified: Secondary | ICD-10-CM | POA: Diagnosis not present

## 2016-12-10 DIAGNOSIS — M25562 Pain in left knee: Secondary | ICD-10-CM | POA: Diagnosis not present

## 2016-12-10 DIAGNOSIS — R262 Difficulty in walking, not elsewhere classified: Secondary | ICD-10-CM

## 2016-12-10 DIAGNOSIS — Z96652 Presence of left artificial knee joint: Secondary | ICD-10-CM

## 2016-12-10 DIAGNOSIS — M6281 Muscle weakness (generalized): Secondary | ICD-10-CM | POA: Diagnosis not present

## 2016-12-10 NOTE — Therapy (Signed)
[facility] Outpatient Rehabilitation [facility] [address] [place], [place], [address] Phone: [phone]   Fax:  [phone]  Physical Therapy Treatment  Patient Details  Name: [name] MRN: [id number] Date of Birth: May 16, 1951 Referring Provider: Doneen Poisson, MD  Encounter Date: 12/10/2016      PT End of Session - 12/10/16 1550    Visit Number 12   Number of Visits 24   Date for PT Re-Evaluation 12/28/16   Authorization Type Medicare BCBS   Authorization Time Period KX visit 15   PT Start Time 0343   PT Stop Time 0425   PT Time Calculation (min) 42 min   Activity Tolerance Patient tolerated treatment well;No increased pain   Behavior During Therapy WFL for tasks assessed/performed      Past Medical History:  Diagnosis Date  . Arthritis   . Asthma    as a child   . Carpal tunnel syndrome    hx of   . Dementia   . Eczema   . High cholesterol   . High cholesterol   . Hypertension   . Psoriasis     Past Surgical History:  Procedure Laterality Date  . HAND SURGERY     Carpal tunnel  . KNEE SURGERY    . SHOULDER SURGERY    . TONSILLECTOMY    . TOTAL KNEE ARTHROPLASTY Left 08/31/2016   Procedure: LEFT TOTAL KNEE ARTHROPLASTY;  Surgeon: Kathryne Hitch, MD;  Location: [facility];  Service: Orthopedics;  Laterality: Left;    There were no vitals filed for this visit.      Subjective Assessment - 12/10/16 1551    Subjective Walking better . Walked alot last day or 2.    Currently in Pain? Yes   Pain Score 5    Pain Location Knee   Pain Orientation Left   Pain Descriptors / Indicators Aching   Pain Type Surgical pain   Pain Onset More than a month ago   Pain Frequency Constant   Aggravating Factors  bend , touch    Pain Relieving Factors rest   Multiple Pain Sites No            [facility] PT Assessment - 12/10/16 0001      AROM   Left Knee Flexion 90                     [facility] Adult PT  Treatment/Exercise - 12/10/16 0001      Therapeutic Activites    Therapeutic Activities Other Therapeutic Activities   Other Therapeutic Activities Stairs 4 inch) 6 inch (4 steps) x 5 each up and down     Knee/Hip Exercises: Stretches   Knee: Self-Stretch to increase Flexion Left   Knee: Self-Stretch Limitations gravity with isometric flexion to allowe add few degrees of extra flexion with graviity     Knee/Hip Exercises: Aerobic   Nustep L5 LE only 10  minutes adjusted x1 for rom     Knee/Hip Exercises: Seated   Long Arc [employer] reps   Long Arc Quad Weight 7 lbs.   Long [employer] Limitations 50 reps      Knee/Hip Exercises: Supine   Quad Sets Left   Quad Sets Limitations 100 reps with knee extension stretching     Manual Therapy   Joint Mobilization AP glides tibia on femur and patella all directions    Soft tissue mobilization STW  to  quadraceps LT  and lower leg for pain  and swelling   Passive ROM extension stretching with quad sets and over pressure                  PT Short Term Goals - 12/06/16 1737      PT SHORT TERM GOAL #1   Title She will be independent with inital HEP   Baseline per husband   Status Achieved     PT SHORT TERM GOAL #2   Title She will improve active extension to -10 degrees   Baseline -24 degrees   Status On-going     PT SHORT TERM GOAL #3   Title she will have active LT knee flexion to 100 degrees   Baseline 90 today   Status On-going     PT SHORT TERM GOAL #4   Title she will report able to have pant leg on skin due to decr sensitivity   Baseline still sensitive but more tolerant to touch an pressure   Status On-going           PT Long Term Goals - 12/06/16 1738      PT LONG TERM GOAL #1   Title She will be independent with all HEP issued   Status On-going     PT LONG TERM GOAL #2   Title She will walk 100% without device in ans out of home   Status Achieved     PT LONG TERM GOAL #3   Title she will have  125 degrees active flexion to improve gait    Status On-going     PT LONG TERM GOAL #4   Title She will be able to stand min 10 sec on each leg to demo good balance for independnece in community   Status On-going     PT LONG TERM GOAL #5   Title She and spouse will report return to normal activity in home    Status On-going     PT LONG TERM GOAL #6   Title She will be able to walk 12 steps up and down step ove rstep safely to be able to access upper floor at home safely.   Status On-going     PT LONG TERM GOAL #7   Title She will report 1-2 max pain with normal home activity   Status On-going               Plan - 12/10/16 1621    Clinical Impression Statement After stretching active flexion to 95 degrees.   She was walking with better flexionn during swing phase. Pain and stiffness continues to limit ROM tolerance to actvity and decr ROMg'   PT Treatment/Interventions Cryotherapy;Electrical Stimulation;Moist Heat;Patient/family education;Passive range of motion;Stair training;Gait training;Taping;Manual techniques;Therapeutic exercise   PT Next Visit Plan Continue ROM and strengthening as tolearted .       FOTO next visit   PT Home Exercise Plan flexion stretching,  shoulder retraction. extension with QS and SAQ    taping   Consulted and Agree with Plan of Care Patient      Patient will benefit from skilled therapeutic intervention in order to improve the following deficits and impairments:  Difficulty walking, Abnormal gait, Decreased range of motion, Pain, Increased edema, Decreased strength, Decreased activity tolerance, Decreased balance, Impaired sensation  Visit Diagnosis: History of total knee arthroplasty, left  Stiffness of left knee, not elsewhere classified  Muscle weakness (generalized)  Acute pain of left knee  Difficulty in walking, not elsewhere classified  Localized edema  Problem List Patient Active Problem List   Diagnosis Date Noted  .  Unilateral primary osteoarthritis, left knee 08/31/2016  . Status post total left knee replacement 08/31/2016  . Major neurocognitive disorder 11/27/2015  . Alzheimer's dementia 11/02/2015  . Vertigo 05/02/2015  . Subacute confusional state 05/02/2015  . Acute encephalopathy   . Memory deficit   . Hyperlipidemia   . Prediabetes   . TIA (transient ischemic attack) 04/16/2015    Caprice Red PT 12/10/2016, 4:39 PM  [facility] Outpatient Rehabilitation [facility] [address] [place], [place], [address] Phone: [phone]   Fax:  [phone]  Name: JAYCIE KREGEL MRN: [id number] Date of Birth: [birth year]
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
## 2016-12-11 NOTE — Telephone Encounter (Signed)
Pt was last seen in Sept for Alzheimer's and has a f/u w/ [place] in June. She's on [employer] patch.

## 2016-12-12 NOTE — Telephone Encounter (Signed)
[place] DMV Request for Driver Re-exam form completed, awaiting MD review/signature. Daughter notified via TC. Verbalized understanding and appreciation for call.

## 2016-12-13 ENCOUNTER — Ambulatory Visit: Payer: Medicare Other

## 2016-12-13 DIAGNOSIS — M25562 Pain in left knee: Secondary | ICD-10-CM | POA: Diagnosis not present

## 2016-12-13 DIAGNOSIS — M6281 Muscle weakness (generalized): Secondary | ICD-10-CM | POA: Diagnosis not present

## 2016-12-13 DIAGNOSIS — R262 Difficulty in walking, not elsewhere classified: Secondary | ICD-10-CM | POA: Diagnosis not present

## 2016-12-13 DIAGNOSIS — M25662 Stiffness of left knee, not elsewhere classified: Secondary | ICD-10-CM

## 2016-12-13 DIAGNOSIS — R600 Localized edema: Secondary | ICD-10-CM

## 2016-12-13 DIAGNOSIS — Z96652 Presence of left artificial knee joint: Secondary | ICD-10-CM

## 2016-12-13 NOTE — Therapy (Signed)
[facility] [address] [place], [place], [address] Phone: [phone]   Fax:  [phone]  Physical Therapy Treatment  Patient Details  Name: [name] MRN: [id number] Date of Birth: [birth year] Referring Provider: Doneen Poisson, MD  Encounter Date: 12/13/2016      PT End of Session - 12/13/16 1635    Visit Number 13   Number of Visits 24   Date for PT Re-Evaluation 12/28/16   Authorization Type Medicare BCBS   Authorization Time Period KX visit 15   PT Start Time 0422   PT Stop Time 0510   PT Time Calculation (min) 48 min   Activity Tolerance Patient tolerated treatment well;Patient limited by pain;No increased pain   Behavior During Therapy WFL for tasks assessed/performed      Past Medical History:  Diagnosis Date  . Arthritis   . Asthma    as a child   . Carpal tunnel syndrome    hx of   . Dementia   . Eczema   . High cholesterol   . High cholesterol   . Hypertension   . Psoriasis     Past Surgical History:  Procedure Laterality Date  . HAND SURGERY     Carpal tunnel  . KNEE SURGERY    . SHOULDER SURGERY    . TONSILLECTOMY    . TOTAL KNEE ARTHROPLASTY Left 08/31/2016   Procedure: LEFT TOTAL KNEE ARTHROPLASTY;  Surgeon: Kathryne Hitch, MD;  Location: [facility];  Service: Orthopedics;  Laterality: Left;    There were no vitals filed for this visit.          [facility] PT Assessment - 12/13/16 0001      PROM   Left Knee Extension -15                     [facility] Adult PT Treatment/Exercise - 12/13/16 0001      Self-Care   Other Self-Care Comments  Instructions to spouse about tape and patella mobs     Knee/Hip Exercises: Aerobic   Nustep L6 LE only 7  minutes adjusted x1 for rom     Knee/Hip Exercises: Supine   Quad Sets Left;5 sets;15 reps   Quad Sets Limitations with cues tactile and verbal     Manual Therapy   Manual Therapy Taping   Joint Mobilization AP  glides tibia on femur and patella all directions    Soft tissue mobilization STW  to  quadraceps LT  and lower leg for pain and swelling   Passive ROM extension stretching with quad sets and over pressure   McConnell 2 strips in inverted V shape to move patella distally. She reported no incr pain after                PT Education - 12/13/16 1725    Education provided Yes   Education Details Spouse to do patella mobs at home and to manage mcconnel tape   Person(s) Educated Patient;Spouse   Methods Explanation;Demonstration;Verbal cues;Tactile cues   Comprehension Returned demonstration;Verbalized understanding          PT Short Term Goals - 12/13/16 1721      PT SHORT TERM GOAL #1   Title She will be independent with inital HEP   Baseline per husband   Status Achieved     PT SHORT TERM GOAL #2   Title She will improve active extension to -10 degrees   Baseline -22   Status On-going  PT SHORT TERM GOAL #3   Title she will have active LT knee flexion to 100 degrees   Baseline 95 post stretching   Status On-going     PT SHORT TERM GOAL #4   Title she will report able to have pant leg on skin due to decr sensitivity   Baseline asked pt and spouse to keep pant leg down for brief periods to try to accomodate/ decr sensitivity   Status On-going           PT Long Term Goals - 12/06/16 1738      PT LONG TERM GOAL #1   Title She will be independent with all HEP issued   Status On-going     PT LONG TERM GOAL #2   Title She will walk 100% without device in ans out of home   Status Achieved     PT LONG TERM GOAL #3   Title she will have 125 degrees active flexion to improve gait    Status On-going     PT LONG TERM GOAL #4   Title She will be able to stand min 10 sec on each leg to demo good balance for independnece in community   Status On-going     PT LONG TERM GOAL #5   Title She and spouse will report return to normal activity in home    Status  On-going     PT LONG TERM GOAL #6   Title She will be able to walk 12 steps up and down step ove rstep safely to be able to access upper floor at home safely.   Status On-going     PT LONG TERM GOAL #7   Title She will report 1-2 max pain with normal home activity   Status On-going               Plan - 12/13/16 1714    Clinical Impression Statement (5 degrees after stretching. Pain continues   per pt report. Still tender  to palpation and patella very stiff though loosened post mobs.   Mr Tullo was able to demo patella mobs for home. Cnanged taping. Discussed this with Mr Beckstrom and management of same   PT Treatment/Interventions Cryotherapy;Electrical Stimulation;Moist Heat;Patient/family education;Passive range of motion;Stair training;Gait training;Taping;Manual techniques;Therapeutic exercise   PT Next Visit Plan Continue ROM and strengthening as tolerated .       FOTO next visit   PT Home Exercise Plan flexion stretching,  shoulder retraction. extension with QS and SAQ    taping   Consulted and Agree with Plan of Care Patient      Patient will benefit from skilled therapeutic intervention in order to improve the following deficits and impairments:  Difficulty walking, Abnormal gait, Decreased range of motion, Pain, Increased edema, Decreased strength, Decreased activity tolerance, Decreased balance, Impaired sensation  Visit Diagnosis: History of total knee arthroplasty, left  Stiffness of left knee, not elsewhere classified  Muscle weakness (generalized)  Acute pain of left knee  Localized edema  Difficulty in walking, not elsewhere classified     Problem List Patient Active Problem List   Diagnosis Date Noted  . Unilateral primary osteoarthritis, left knee 08/31/2016  . Status post total left knee replacement 08/31/2016  . Major neurocognitive disorder 11/27/2015  . Alzheimer's dementia 11/02/2015  . Vertigo 05/02/2015  . Subacute confusional state  05/02/2015  . Acute encephalopathy   . Memory deficit   . Hyperlipidemia   . Prediabetes   . TIA (transient  ischemic attack) 04/16/2015    Caprice Red  PT 12/13/2016, 5:33 PM  [facility] [address] [place], [place], [address] Phone: [phone]   Fax:  [phone]  Name: TICHINA KOEBEL MRN: [id number] Date of Birth: 1951-04-07

## 2016-12-17 ENCOUNTER — Ambulatory Visit: Payer: Medicare Other

## 2016-12-17 DIAGNOSIS — Z96652 Presence of left artificial knee joint: Secondary | ICD-10-CM

## 2016-12-17 DIAGNOSIS — R262 Difficulty in walking, not elsewhere classified: Secondary | ICD-10-CM

## 2016-12-17 DIAGNOSIS — R600 Localized edema: Secondary | ICD-10-CM | POA: Diagnosis not present

## 2016-12-17 DIAGNOSIS — M25662 Stiffness of left knee, not elsewhere classified: Secondary | ICD-10-CM

## 2016-12-17 DIAGNOSIS — M25562 Pain in left knee: Secondary | ICD-10-CM | POA: Diagnosis not present

## 2016-12-17 DIAGNOSIS — M6281 Muscle weakness (generalized): Secondary | ICD-10-CM

## 2016-12-17 NOTE — Therapy (Signed)
[facility] [address] [place], [place], [address] Phone: [phone]   Fax:  [phone]  Physical Therapy Treatment  Patient Details  Name: [name] MRN: [id number] Date of Birth: 08/28/1950 Referring Provider: Doneen Poisson, MD  Encounter Date: 12/17/2016      PT End of Session - 12/17/16 1515    Visit Number 14   Number of Visits 24   Date for PT Re-Evaluation 12/28/16   Authorization Time Period KX visit 15   PT Start Time 0300   PT Stop Time 0345   PT Time Calculation (min) 45 min   Activity Tolerance Patient tolerated treatment well;Patient limited by pain;No increased pain   Behavior During Therapy WFL for tasks assessed/performed      Past Medical History:  Diagnosis Date  . Arthritis   . Asthma    as a child   . Carpal tunnel syndrome    hx of   . Dementia   . Eczema   . High cholesterol   . High cholesterol   . Hypertension   . Psoriasis     Past Surgical History:  Procedure Laterality Date  . HAND SURGERY     Carpal tunnel  . KNEE SURGERY    . SHOULDER SURGERY    . TONSILLECTOMY    . TOTAL KNEE ARTHROPLASTY Left 08/31/2016   Procedure: LEFT TOTAL KNEE ARTHROPLASTY;  Surgeon: Kathryne Hitch, MD;  Location: [facility];  Service: Orthopedics;  Laterality: Left;    There were no vitals filed for this visit.      Subjective Assessment - 12/17/16 1515    Subjective No changes . Not sure if tape helped   Patient is accompained by: Family member   Currently in Pain? No/denies  pain with bending            [facility] PT Assessment - 12/17/16 0001      AROM   Left Knee Extension -20   Left Knee Flexion 85  before stretching     PROM   Left Knee Extension -15     Strength   Left Knee Flexion 4+/5  possibly due to pain.    Left Knee Extension 4+/5  possibly due to pain.                      [facility] Adult PT Treatment/Exercise - 12/17/16 0001      Knee/Hip Exercises: Stretches   Knee: Self-Stretch to increase Flexion Left;60 seconds;2 reps   Knee: Self-Stretch Limitations gravity with isometric flexion to allowe add few degrees of extra flexion with graviity     Knee/Hip Exercises: Aerobic   Nustep L6 LE only 7  minutes adjusted x1 for rom     Knee/Hip Exercises: Seated   Long Arc [employer] reps   Long Arc Quad Weight 7 lbs.   Long [employer] Limitations 50 reps      Knee/Hip Exercises: Supine   Quad Sets Left;5 sets;15 reps   Quad Sets Limitations tactile cues     Manual Therapy   Joint Mobilization AP glides tibia on femur and patella all directions    Soft tissue mobilization STW  to  quadraceps LT  and lower leg for pain and swelling   Passive ROM extension stretching with quad sets and over pressure and contract relax                  PT Short Term Goals - 12/13/16 1721  PT SHORT TERM GOAL #1   Title She will be independent with inital HEP   Baseline per husband   Status Achieved     PT SHORT TERM GOAL #2   Title She will improve active extension to -10 degrees   Baseline -22   Status On-going     PT SHORT TERM GOAL #3   Title she will have active LT knee flexion to 100 degrees   Baseline 95 post stretching   Status On-going     PT SHORT TERM GOAL #4   Title she will report able to have pant leg on skin due to decr sensitivity   Baseline asked pt and spouse to keep pant leg down for brief periods to try to accomodate/ decr sensitivity   Status On-going           PT Long Term Goals - 12/06/16 1738      PT LONG TERM GOAL #1   Title She will be independent with all HEP issued   Status On-going     PT LONG TERM GOAL #2   Title She will walk 100% without device in ans out of home   Status Achieved     PT LONG TERM GOAL #3   Title she will have 125 degrees active flexion to improve gait    Status On-going     PT LONG TERM GOAL #4   Title She will be able to stand min 10 sec on each  leg to demo good balance for independnece in community   Status On-going     PT LONG TERM GOAL #5   Title She and spouse will report return to normal activity in home    Status On-going     PT LONG TERM GOAL #6   Title She will be able to walk 12 steps up and down step ove rstep safely to be able to access upper floor at home safely.   Status On-going     PT LONG TERM GOAL #7   Title She will report 1-2 max pain with normal home activity   Status On-going               Plan - 12/17/16 1514    Clinical Impression Statement Range worse at start of sesson but back to 90 degrees post stretching. She reports independence with home tasks and activity. . Continues with decr strength possibly related to pain.  Will continue to work on  ROM and strength   PT Treatment/Interventions Cryotherapy;Electrical Stimulation;Moist Heat;Patient/family education;Passive range of motion;Stair training;Gait training;Taping;Manual techniques;Therapeutic exercise   PT Next Visit Plan Continue ROM and strengthening as tolerated .      no tape as it does not appear help    PT Home Exercise Plan flexion stretching,  shoulder retraction. extension with QS and SAQ    taping   Consulted and Agree with Plan of Care Patient      Patient will benefit from skilled therapeutic intervention in order to improve the following deficits and impairments:  Difficulty walking, Abnormal gait, Decreased range of motion, Pain, Increased edema, Decreased strength, Decreased activity tolerance, Decreased balance, Impaired sensation  Visit Diagnosis: History of total knee arthroplasty, left  Stiffness of left knee, not elsewhere classified  Muscle weakness (generalized)  Acute pain of left knee  Localized edema  Difficulty in walking, not elsewhere classified     Problem List Patient Active Problem List   Diagnosis Date Noted  . Unilateral primary osteoarthritis, left knee  08/31/2016  . Status post total left  knee replacement 08/31/2016  . Major neurocognitive disorder 11/27/2015  . Alzheimer's dementia 11/02/2015  . Vertigo 05/02/2015  . Subacute confusional state 05/02/2015  . Acute encephalopathy   . Memory deficit   . Hyperlipidemia   . Prediabetes   . TIA (transient ischemic attack) 04/16/2015    Caprice Red  PT 12/17/2016, 5:30 PM  [facility] [address] [place], [place], [address] Phone: [phone]   Fax:  [phone]  Name: ASYAH CANDLER MRN: [id number] Date of Birth: 1951-04-15

## 2016-12-18 NOTE — Telephone Encounter (Signed)
Driver Re-exam form signed and faxed to [facility] F # [phone].

## 2016-12-20 ENCOUNTER — Ambulatory Visit: Payer: Medicare Other

## 2016-12-25 ENCOUNTER — Telehealth (INDEPENDENT_AMBULATORY_CARE_PROVIDER_SITE_OTHER): Payer: Self-pay | Admitting: Orthopaedic Surgery

## 2016-12-25 NOTE — Telephone Encounter (Signed)
Patient's daughter Yvette Dike([name]) called asked if Dr Magnus IvanBlackman could call her in reference to her mother's Lt knee. Yvette DikeJennifer advised her mother can not tell her what is wrong with her knee. Yvette DikeJennifer advised her mother has Alzheimer and can not remember. The number to contact Yvette DikeJennifer is [phone]

## 2016-12-26 ENCOUNTER — Ambulatory Visit: Payer: Medicare Other | Attending: Orthopaedic Surgery | Admitting: Physical Therapy

## 2016-12-26 DIAGNOSIS — Z96652 Presence of left artificial knee joint: Secondary | ICD-10-CM | POA: Insufficient documentation

## 2016-12-26 DIAGNOSIS — M25562 Pain in left knee: Secondary | ICD-10-CM | POA: Insufficient documentation

## 2016-12-26 DIAGNOSIS — R262 Difficulty in walking, not elsewhere classified: Secondary | ICD-10-CM | POA: Insufficient documentation

## 2016-12-26 DIAGNOSIS — M6281 Muscle weakness (generalized): Secondary | ICD-10-CM | POA: Insufficient documentation

## 2016-12-26 DIAGNOSIS — I10 Essential (primary) hypertension: Secondary | ICD-10-CM | POA: Diagnosis not present

## 2016-12-26 DIAGNOSIS — R600 Localized edema: Secondary | ICD-10-CM | POA: Insufficient documentation

## 2016-12-26 DIAGNOSIS — R7989 Other specified abnormal findings of blood chemistry: Secondary | ICD-10-CM | POA: Diagnosis not present

## 2016-12-26 DIAGNOSIS — G308 Other Alzheimer's disease: Secondary | ICD-10-CM | POA: Diagnosis not present

## 2016-12-26 DIAGNOSIS — M25662 Stiffness of left knee, not elsewhere classified: Secondary | ICD-10-CM | POA: Insufficient documentation

## 2016-12-26 DIAGNOSIS — I6789 Other cerebrovascular disease: Secondary | ICD-10-CM | POA: Diagnosis not present

## 2016-12-26 DIAGNOSIS — G8929 Other chronic pain: Secondary | ICD-10-CM | POA: Insufficient documentation

## 2016-12-26 NOTE — Telephone Encounter (Signed)
Called daughter answered qustion about if knee looks ok on radiographs and clinically

## 2016-12-27 ENCOUNTER — Ambulatory Visit: Payer: Medicare Other

## 2017-01-02 ENCOUNTER — Ambulatory Visit: Payer: Medicare Other

## 2017-01-02 DIAGNOSIS — M25562 Pain in left knee: Secondary | ICD-10-CM

## 2017-01-02 DIAGNOSIS — M25662 Stiffness of left knee, not elsewhere classified: Secondary | ICD-10-CM

## 2017-01-02 DIAGNOSIS — M6281 Muscle weakness (generalized): Secondary | ICD-10-CM | POA: Diagnosis not present

## 2017-01-02 DIAGNOSIS — R600 Localized edema: Secondary | ICD-10-CM | POA: Diagnosis not present

## 2017-01-02 DIAGNOSIS — R262 Difficulty in walking, not elsewhere classified: Secondary | ICD-10-CM | POA: Diagnosis not present

## 2017-01-02 DIAGNOSIS — Z96652 Presence of left artificial knee joint: Secondary | ICD-10-CM

## 2017-01-02 DIAGNOSIS — G8929 Other chronic pain: Secondary | ICD-10-CM | POA: Diagnosis not present

## 2017-01-03 ENCOUNTER — Ambulatory Visit: Payer: Medicare Other

## 2017-01-03 DIAGNOSIS — Z96652 Presence of left artificial knee joint: Secondary | ICD-10-CM | POA: Diagnosis not present

## 2017-01-03 DIAGNOSIS — M6281 Muscle weakness (generalized): Secondary | ICD-10-CM

## 2017-01-03 DIAGNOSIS — M25662 Stiffness of left knee, not elsewhere classified: Secondary | ICD-10-CM

## 2017-01-03 DIAGNOSIS — R262 Difficulty in walking, not elsewhere classified: Secondary | ICD-10-CM

## 2017-01-03 NOTE — Therapy (Signed)
[facility] [address] [place], [place], [address] Phone: [phone]   Fax:  [phone]  Physical Therapy Treatment  Patient Details  Name: [name] MRN: [id number] Date of Birth: 03-Feb-1951 Referring Provider: Doneen Poisson, MD  Encounter Date: 01/02/2017      PT End of Session - 01/02/17 1626    Visit Number 15   Number of Visits 24   Date for PT Re-Evaluation 12/28/16   Authorization Type Medicare BCBS   Authorization Time Period KX visit 15   PT Start Time 0425   PT Stop Time 0512   PT Time Calculation (min) 47 min   Activity Tolerance Patient tolerated treatment well;No increased pain;Patient limited by pain   Behavior During Therapy [facility] for tasks assessed/performed      Past Medical History:  Diagnosis Date  . Arthritis   . Asthma    as a child   . Carpal tunnel syndrome    hx of   . Dementia   . Eczema   . High cholesterol   . High cholesterol   . Hypertension   . Psoriasis     Past Surgical History:  Procedure Laterality Date  . HAND SURGERY     Carpal tunnel  . KNEE SURGERY    . SHOULDER SURGERY    . TONSILLECTOMY    . TOTAL KNEE ARTHROPLASTY Left 08/31/2016   Procedure: LEFT TOTAL KNEE ARTHROPLASTY;  Surgeon: Kathryne Hitch, MD;  Location: [facility];  Service: Orthopedics;  Laterality: Left;    There were no vitals filed for this visit.      Subjective Assessment - 01/03/17 0658    Subjective She reports continued pain and sensitivity.    Currently in Pain? No/denies  pain with activity and bending and touch   Pain Score 5    Pain Location Knee   Pain Orientation Left;Anterior   Pain Descriptors / Indicators Aching   Pain Type Chronic pain   Pain Onset More than a month ago   Pain Frequency Intermittent   Aggravating Factors  bedn , touch   Pain Relieving Factors rest   Multiple Pain Sites No            [facility] PT Assessment - 01/03/17 0001      AROM   Left Knee Extension -23   Left Knee Flexion 85     PROM   Left Knee Extension -15   Left Knee Flexion 91                     [facility] Adult PT Treatment/Exercise - 01/03/17 0001      Knee/Hip Exercises: Aerobic   Nustep L6 LE only 7  minutes adjusted x1 for rom     Knee/Hip Exercises: Seated   Long Arc Quad Left   Long Arc Quad Weight 10 lbs.   Long Arc [employer] Limitations 50   Sit to [employer] without UE support;10 reps  Most done RT leg due to decr LT knee flexion     Knee/Hip Exercises: Supine   Quad Sets Left;2 sets;10 reps     Manual Therapy   Joint Mobilization AP glides tibia on femur and patella all directions    Soft tissue mobilization STW to scar but mobility is good proximal and less so distal scar   Passive ROM extension stretching with quad sets and over pressure and contract relax  PT Education - 01/02/17 0701    Education provided Yes   Education Details lack of progress with ROM   Person(s) Educated Patient   Methods Explanation   Comprehension Verbalized understanding          PT Short Term Goals - 12/13/16 1721      PT SHORT TERM GOAL #1   Title She will be independent with inital HEP   Baseline per husband   Status Achieved     PT SHORT TERM GOAL #2   Title She will improve active extension to -10 degrees   Baseline -22   Status On-going     PT SHORT TERM GOAL #3   Title she will have active LT knee flexion to 100 degrees   Baseline 95 post stretching   Status On-going     PT SHORT TERM GOAL #4   Title she will report able to have pant leg on skin due to decr sensitivity   Baseline asked pt and spouse to keep pant leg down for brief periods to try to accomodate/ decr sensitivity   Status On-going           PT Long Term Goals - 12/06/16 1738      PT LONG TERM GOAL #1   Title She will be independent with all HEP issued   Status On-going     PT LONG TERM GOAL #2   Title She will walk 100% without  device in ans out of home   Status Achieved     PT LONG TERM GOAL #3   Title she will have 125 degrees active flexion to improve gait    Status On-going     PT LONG TERM GOAL #4   Title She will be able to stand min 10 sec on each leg to demo good balance for independnece in community   Status On-going     PT LONG TERM GOAL #5   Title She and spouse will report return to normal activity in home    Status On-going     PT LONG TERM GOAL #6   Title She will be able to walk 12 steps up and down step ove rstep safely to be able to access upper floor at home safely.   Status On-going     PT LONG TERM GOAL #7   Title She will report 1-2 max pain with normal home activity   Status On-going               Plan - 01/02/17 1627    Clinical Impression Statement She is now 4 months from surgery and ROM is not changing so improvement at this point is not likely . Will spend time on Wtrengthening before discharge.    PT Treatment/Interventions Cryotherapy;Electrical Stimulation;Moist Heat;Patient/family education;Passive range of motion;Stair training;Gait training;Taping;Manual techniques;Therapeutic exercise   PT Next Visit Plan Continue strengthening as tolerated .      no tape as it does not appear help    PT Home Exercise Plan flexion stretching,  shoulder retraction. extension with QS and SAQ    taping   Consulted and Agree with Plan of Care Patient      Patient will benefit from skilled therapeutic intervention in order to improve the following deficits and impairments:  Difficulty walking, Abnormal gait, Decreased range of motion, Pain, Increased edema, Decreased strength, Decreased activity tolerance, Decreased balance, Impaired sensation  Visit Diagnosis: History of total knee arthroplasty, left  Stiffness of left knee, not elsewhere classified  Muscle weakness (generalized)  Acute pain of left knee  Localized edema     Problem List Patient Active Problem List    Diagnosis Date Noted  . Unilateral primary osteoarthritis, left knee 08/31/2016  . Status post total left knee replacement 08/31/2016  . Major neurocognitive disorder 11/27/2015  . Alzheimer's dementia 11/02/2015  . Vertigo 05/02/2015  . Subacute confusional state 05/02/2015  . Acute encephalopathy   . Memory deficit   . Hyperlipidemia   . Prediabetes   . TIA (transient ischemic attack) 04/16/2015    Caprice Red  PT 01/03/2017, 7:58 AM  [facility] [address] [place], [place], [address] Phone: [phone]   Fax:  [phone]  Name: TAQWA DEEM MRN: [id number] Date of Birth: 1951-03-10

## 2017-01-03 NOTE — Therapy (Signed)
[place], [place], [address] Phone: [phone]   Fax:  [phone]  Physical Therapy Treatment  Patient Details  Name: [name] MRN: [id number] Date of Birth: 23-Oct-1950 Referring Provider: Jean Rosenthal, MD  Encounter Date: 01/03/2017      PT End of Session - 01/03/17 1750    Visit Number 16   Number of Visits 24   Date for PT Re-Evaluation 01/18/17   Authorization Type Medicare BCBS   Authorization Time Period KX visit 15   PT Start Time 0345   PT Stop Time 0430   PT Time Calculation (min) 45 min   Activity Tolerance Patient tolerated treatment well;No increased pain;Patient limited by pain   Behavior During Therapy [facility] for tasks assessed/performed      Past Medical History:  Diagnosis Date  . Arthritis   . Asthma    as a child   . Carpal tunnel syndrome    hx of   . Dementia   . Eczema   . High cholesterol   . High cholesterol   . Hypertension   . Psoriasis     Past Surgical History:  Procedure Laterality Date  . HAND SURGERY     Carpal tunnel  . KNEE SURGERY    . SHOULDER SURGERY    . TONSILLECTOMY    . TOTAL KNEE ARTHROPLASTY Left 08/31/2016   Procedure: LEFT TOTAL KNEE ARTHROPLASTY;  Surgeon: Mcarthur Rossetti, MD;  Location: [facility];  Service: Orthopedics;  Laterality: Left;    There were no vitals filed for this visit.      Subjective Assessment - 01/03/17 1603    Subjective pain / sore from yesterday   Currently in Pain? Yes   Pain Location Knee   Pain Orientation Left;Anterior   Pain Descriptors / Indicators Aching   Pain Type Chronic pain   Pain Onset More than a month ago   Pain Frequency Intermittent   Aggravating Factors  bend/touch   Pain Relieving Factors rest   Multiple Pain Sites No            [facility] PT Assessment - 01/03/17 0001      AROM   Left Knee Extension -23   Left Knee Flexion 85     PROM   Left Knee Extension -15   Left Knee Flexion 91                     [facility] Adult PT Treatment/Exercise - 01/03/17 0001      Knee/Hip Exercises: Aerobic   Nustep L6 LE only 7  minutes adjusted x1 for rom     Knee/Hip Exercises: Seated   Long Arc Quad Left   Long Arc Quad Weight 10 lbs.   Long Arc [employer] Limitations 50   Sit to [employer] without UE support;10 reps  Most done RT leg due to decr LT knee flexion     Knee/Hip Exercises: Supine   Quad Sets Left;2 sets;10 reps     Manual Therapy   Joint Mobilization    Soft tissue mobilization    Passive ROM      8 inch step ups x 20 Lt leg , standing hip abduction and extension x 20 RT and LT with sliding foot on pillow case cued to bend knee and stay over stance foot. Hips stretching abduction and extension in standing.  Knee flexion 5 pounds x 20 standing  PT Short Term Goals - 01/03/17 1620      PT SHORT TERM GOAL #1   Title She will be independent with inital HEP   Baseline per husband   Status Achieved     PT SHORT TERM GOAL #2   Title She will improve active extension to -10 degrees   Baseline -23   Status On-going     PT SHORT TERM GOAL #3   Title she will have active LT knee flexion to 100 degrees   Baseline 85-88 degrees   Status On-going     PT SHORT TERM GOAL #4   Title she will report able to have pant leg on skin due to decr sensitivity   Baseline asked pt and spouse to keep pant leg down for brief periods to try to accomodate/ decr sensitivity   Status Partially Met           PT Long Term Goals - 01/03/17 1621      PT LONG TERM GOAL #1   Title She will be independent with all HEP issued   Status On-going     PT LONG TERM GOAL #2   Title She will walk 100% without device in and out of home   Status Achieved     PT LONG TERM GOAL #3   Title she will have 125 degrees active flexion to improve gait    Baseline appears she will not attain this goal   Status Deferred     PT LONG TERM GOAL #4   Title  She will be able to stand min 10 sec on each leg to demo good balance for independence in community     PT LONG TERM GOAL #5   Title She and spouse will report return to normal activity in home    Baseline Sheis doing more home tasks per pt and spouse report   Status Partially Met     PT LONG TERM GOAL #6   Title She will be able to walk 12 steps up and down step ove rstep safely to be able to access upper floor at home safely.   Baseline unable to bend knee sufficiently to do this   Status Deferred     PT LONG TERM GOAL #7   Title She will report 1-2 max pain with normal home activity   Baseline varies but pain with strengthng an d walking too much   Status On-going               Plan - 01/03/17 1605    Clinical Impression Statement Continue with knee pain and reported lateral hip pain distal from greater trochantor and this is quite tender to touch. Even scar is still very sensitive to light touch.  She is 4 months from surgery and I don't think her ROM with change. She may benefit from strength so will continue to end of MAy 2018 then discharge.    PT Frequency 2x / week   PT Duration 3 weeks  from 12/28/16   PT Treatment/Interventions Cryotherapy;Electrical Stimulation;Moist Heat;Patient/family education;Passive range of motion;Stair training;Gait training;Taping;Manual techniques;Therapeutic exercise   PT Next Visit Plan Continue strengthening as tolerated .         PT Home Exercise Plan flexion stretching,  shoulder retraction. extension with QS and SAQ    taping   Consulted and Agree with Plan of Care Patient      Patient will benefit from skilled therapeutic intervention in order to improve the following deficits  and impairments:  Difficulty walking, Abnormal gait, Decreased range of motion, Pain, Increased edema, Decreased strength, Decreased activity tolerance, Decreased balance, Impaired sensation  Visit Diagnosis: History of total knee arthroplasty, left - Plan: PT  plan of care cert/re-cert  Stiffness of left knee, not elsewhere classified - Plan: PT plan of care cert/re-cert  Muscle weakness (generalized) - Plan: PT plan of care cert/re-cert  Difficulty in walking, not elsewhere classified - Plan: PT plan of care cert/re-cert     Problem List Patient Active Problem List   Diagnosis Date Noted  . Unilateral primary osteoarthritis, left knee 08/31/2016  . Status post total left knee replacement 08/31/2016  . Major neurocognitive disorder 11/27/2015  . Alzheimer's dementia 11/02/2015  . Vertigo 05/02/2015  . Subacute confusional state 05/02/2015  . Acute encephalopathy   . Memory deficit   . Hyperlipidemia   . Prediabetes   . TIA (transient ischemic attack) 04/16/2015    Darrel Hoover  PT 01/03/2017, 5:57 PM  [facility] [address] [place], [place], [address] Phone: [phone]   Fax:  [phone]  Name: [name] MRN: [id number] Date of Birth: [birth year]

## 2017-01-08 ENCOUNTER — Ambulatory Visit: Payer: Medicare Other

## 2017-01-08 DIAGNOSIS — M6281 Muscle weakness (generalized): Secondary | ICD-10-CM

## 2017-01-08 DIAGNOSIS — M25662 Stiffness of left knee, not elsewhere classified: Secondary | ICD-10-CM

## 2017-01-08 DIAGNOSIS — R262 Difficulty in walking, not elsewhere classified: Secondary | ICD-10-CM

## 2017-01-08 DIAGNOSIS — Z96652 Presence of left artificial knee joint: Secondary | ICD-10-CM | POA: Diagnosis not present

## 2017-01-08 DIAGNOSIS — G8929 Other chronic pain: Secondary | ICD-10-CM

## 2017-01-08 DIAGNOSIS — M25562 Pain in left knee: Secondary | ICD-10-CM

## 2017-01-08 NOTE — Therapy (Addendum)
[place], [place], [address] Phone: [phone]   Fax:  [phone]  Physical Therapy Treatment/Discharge  Patient Details  Name: [name] MRN: [id number] Date of Birth: [birth year] Referring Provider: Jean Rosenthal, MD  Encounter Date: 01/08/2017      PT End of Session - 01/08/17 1628    Visit Number 17   Number of Visits 24   Date for PT Re-Evaluation 01/18/17   Authorization Type Medicare BCBS   Authorization Time Period KX visit 15   PT Start Time 0424   PT Stop Time 0510   PT Time Calculation (min) 46 min   Activity Tolerance Patient tolerated treatment well;No increased pain   Behavior During Therapy [facility] for tasks assessed/performed      Past Medical History:  Diagnosis Date  . Arthritis   . Asthma    as a child   . Carpal tunnel syndrome    hx of   . Dementia   . Eczema   . High cholesterol   . High cholesterol   . Hypertension   . Psoriasis     Past Surgical History:  Procedure Laterality Date  . HAND SURGERY     Carpal tunnel  . KNEE SURGERY    . SHOULDER SURGERY    . TONSILLECTOMY    . TOTAL KNEE ARTHROPLASTY Left 08/31/2016   Procedure: LEFT TOTAL KNEE ARTHROPLASTY;  Surgeon: Mcarthur Rossetti, MD;  Location: [facility];  Service: Orthopedics;  Laterality: Left;    There were no vitals filed for this visit.      Subjective Assessment - 01/08/17 1626    Subjective Just a little pain today. She reports she is doing normal home tasks as able.   Currently in Pain? Yes   Pain Score --  mild   Pain Location Knee   Pain Orientation Left;Anterior   Pain Descriptors / Indicators Aching   Pain Type Chronic pain   Pain Onset More than a month ago   Pain Frequency Intermittent   Aggravating Factors  todh and bend knee.    Pain Relieving Factors rest   Multiple Pain Sites No                         [facility] Adult PT Treatment/Exercise -  01/08/17 0001      Knee/Hip Exercises: Stretches   Other Knee/Hip Stretches self stretch flexion foot on 8 inch step     Knee/Hip Exercises: Aerobic   Nustep L7   LE only 7  minutes      Knee/Hip Exercises: Standing   Lateral Step Up Left;20 reps;Hand Hold: 1;Step Height: 8"   Forward Step Up Hand Hold: 1;Step Height: 8";20 reps;Left   Other Standing Knee Exercises TKE into ball into wall x 20 reps 5-10 sec hold, side stepping 20 feet x 5 RT and LT.        Knee/Hip Exercises: Seated   Long Arc Quad Left   Long Arc Quad Weight 10 lbs.   Long [employer] Limitations 75     Knee/Hip Exercises: Supine   Quad Sets Left;20 reps   [employer] Limitations tactile cues with increased quad contraction LT      Manual Therapy   Joint Mobilization AP glides tibia on femur and patella all directions    Passive ROM flexion and extension stretching      Squats x 20 with hand hold to bars x2.  Pf  x 15              PT Short Term Goals - 01/03/17 1620      PT SHORT TERM GOAL #1   Title She will be independent with inital HEP   Baseline per husband   Status Achieved     PT SHORT TERM GOAL #2   Title She will improve active extension to -10 degrees   Baseline -23   Status On-going     PT SHORT TERM GOAL #3   Title she will have active LT knee flexion to 100 degrees   Baseline 85-88 degrees   Status On-going     PT SHORT TERM GOAL #4   Title she will report able to have pant leg on skin due to decr sensitivity   Baseline asked pt and spouse to keep pant leg down for brief periods to try to accomodate/ decr sensitivity   Status Partially Met           PT Long Term Goals - 01/08/17 1711      PT LONG TERM GOAL #1   Title She will be independent with all HEP issued   Status On-going     PT LONG TERM GOAL #2   Title She will walk 100% without device in and out of home   Status Achieved     PT LONG TERM GOAL #3   Title she will have 125 degrees active flexion to improve  gait    Baseline 90 today   Status Deferred     PT LONG TERM GOAL #4   Title She will be able to stand min 10 sec on each leg to demo good balance for independence in community   Status On-going     PT LONG TERM GOAL #5   Title She and spouse will report return to normal activity in home    Baseline Sheis doing more home tasks per pt and spouse report   Status Partially Met     PT LONG TERM GOAL #6   Title She will be able to walk 12 steps up and down step ove rstep safely to be able to access upper floor at home safely.   Baseline unable to bend knee sufficiently to do this   Status Deferred     PT LONG TERM GOAL #7   Title She will report 1-2 max pain with normal home activity   Baseline varies but pain with strengthng an d walking too much   Status On-going               Plan - 01/08/17 1629    Clinical Impression Statement no increase in pain. She was able to do all exercise limited by flexion of knee Lt and weakness .  Continue strength and ROM less than strength.  Issue any final HEP for discharge end of the month .    PT Treatment/Interventions Cryotherapy;Electrical Stimulation;Moist Heat;Patient/family education;Passive range of motion;Stair training;Gait training;Taping;Manual techniques;Therapeutic exercise   PT Next Visit Plan Continue strengthening as tolerated .         PT Home Exercise Plan flexion stretching,  shoulder retraction. extension with QS and SAQ    taping   Consulted and Agree with Plan of Care Patient      Patient will benefit from skilled therapeutic intervention in order to improve the following deficits and impairments:  Difficulty walking, Abnormal gait, Decreased range of motion, Pain, Increased edema, Decreased strength, Decreased activity tolerance, Decreased balance,  Impaired sensation  Visit Diagnosis: History of total knee arthroplasty, left  Stiffness of left knee, not elsewhere classified  Muscle weakness  (generalized)  Difficulty in walking, not elsewhere classified  Chronic knee pain after total replacement of left knee joint     Problem List Patient Active Problem List   Diagnosis Date Noted  . Unilateral primary osteoarthritis, left knee 08/31/2016  . Status post total left knee replacement 08/31/2016  . Major neurocognitive disorder 11/27/2015  . Alzheimer's dementia 11/02/2015  . Vertigo 05/02/2015  . Subacute confusional state 05/02/2015  . Acute encephalopathy   . Memory deficit   . Hyperlipidemia   . Prediabetes   . TIA (transient ischemic attack) 04/16/2015    Darrel Hoover  PT 01/08/2017, 5:14 PM  [id number] [address] [place], [place], [address] Phone: [phone]   Fax:  [phone]  Name: [name] MRN: [id number] Date of Birth: [birth year]  PHYSICAL THERAPY DISCHARGE SUMMARY  Visits from Start of Care: 17  Current functional level related to goals / functional outcomes: See above . She continued with incr sensitivity and decreased ROM.    Remaining deficits: Decreased ROM and incr sensitivity though improved from eval Education / Equipment: HEP  Plan: Patient agrees to discharge.  Patient goals were partially met. Patient is being discharged due to not returning since the last visit.  ?????    Noralee Stain PT    03/11/17    2:31 PM

## 2017-01-10 ENCOUNTER — Ambulatory Visit: Payer: Medicare Other | Admitting: Physical Therapy

## 2017-01-22 ENCOUNTER — Ambulatory Visit: Payer: Medicare Other | Admitting: Adult Health

## 2017-02-28 ENCOUNTER — Ambulatory Visit (INDEPENDENT_AMBULATORY_CARE_PROVIDER_SITE_OTHER): Payer: Medicare Other | Admitting: Physician Assistant

## 2017-03-03 ENCOUNTER — Emergency Department (HOSPITAL_COMMUNITY)
Admission: EM | Admit: 2017-03-03 | Discharge: 2017-03-03 | Disposition: A | Discharge status: Home / Self Care | Payer: Medicare Other | Attending: Emergency Medicine | Admitting: Emergency Medicine

## 2017-03-03 ENCOUNTER — Emergency Department (HOSPITAL_COMMUNITY): Payer: Medicare Other

## 2017-03-03 DIAGNOSIS — Z8673 Personal history of transient ischemic attack (TIA), and cerebral infarction without residual deficits: Secondary | ICD-10-CM | POA: Diagnosis not present

## 2017-03-03 DIAGNOSIS — R110 Nausea: Secondary | ICD-10-CM | POA: Diagnosis not present

## 2017-03-03 DIAGNOSIS — Z87891 Personal history of nicotine dependence: Secondary | ICD-10-CM | POA: Insufficient documentation

## 2017-03-03 DIAGNOSIS — Z79899 Other long term (current) drug therapy: Secondary | ICD-10-CM | POA: Diagnosis not present

## 2017-03-03 DIAGNOSIS — R42 Dizziness and giddiness: Secondary | ICD-10-CM | POA: Diagnosis not present

## 2017-03-03 DIAGNOSIS — I10 Essential (primary) hypertension: Secondary | ICD-10-CM | POA: Insufficient documentation

## 2017-03-03 DIAGNOSIS — J45909 Unspecified asthma, uncomplicated: Secondary | ICD-10-CM | POA: Insufficient documentation

## 2017-03-03 DIAGNOSIS — Z7982 Long term (current) use of aspirin: Secondary | ICD-10-CM | POA: Insufficient documentation

## 2017-03-03 DIAGNOSIS — F0390 Unspecified dementia without behavioral disturbance: Secondary | ICD-10-CM | POA: Insufficient documentation

## 2017-03-03 DIAGNOSIS — Z96652 Presence of left artificial knee joint: Secondary | ICD-10-CM | POA: Insufficient documentation

## 2017-03-03 DIAGNOSIS — R112 Nausea with vomiting, unspecified: Secondary | ICD-10-CM | POA: Diagnosis not present

## 2017-03-03 DIAGNOSIS — R404 Transient alteration of awareness: Secondary | ICD-10-CM | POA: Diagnosis not present

## 2017-03-03 LAB — COMPREHENSIVE METABOLIC PANEL
ALT: 15 U/L (ref 14–54)
ANION GAP: 10 (ref 5–15)
AST: 21 U/L (ref 15–41)
Albumin: 4 g/dL (ref 3.5–5.0)
Alkaline Phosphatase: 89 U/L (ref 38–126)
BILIRUBIN TOTAL: 0.6 mg/dL (ref 0.3–1.2)
BUN: 18 mg/dL (ref 6–20)
CHLORIDE: 104 mmol/L (ref 101–111)
CO2: 26 mmol/L (ref 22–32)
Calcium: 9.8 mg/dL (ref 8.9–10.3)
Creatinine, Ser: 0.83 mg/dL (ref 0.44–1.00)
Glucose, Bld: 103 mg/dL — ABNORMAL HIGH (ref 65–99)
POTASSIUM: 4 mmol/L (ref 3.5–5.1)
Sodium: 140 mmol/L (ref 135–145)
TOTAL PROTEIN: 6.6 g/dL (ref 6.5–8.1)

## 2017-03-03 LAB — CBC WITH DIFFERENTIAL/PLATELET
BASOS ABS: 0 10*3/uL (ref 0.0–0.1)
Basophils Relative: 0 %
EOS PCT: 1 %
Eosinophils Absolute: 0.1 10*3/uL (ref 0.0–0.7)
HCT: 40.9 % (ref 36.0–46.0)
HEMOGLOBIN: 13.2 g/dL (ref 12.0–15.0)
LYMPHS ABS: 1.4 10*3/uL (ref 0.7–4.0)
LYMPHS PCT: 18 %
MCH: 28.7 pg (ref 26.0–34.0)
MCHC: 32.3 g/dL (ref 30.0–36.0)
MCV: 88.9 fL (ref 78.0–100.0)
Monocytes Absolute: 0.6 10*3/uL (ref 0.1–1.0)
Monocytes Relative: 7 %
NEUTROS ABS: 5.5 10*3/uL (ref 1.7–7.7)
NEUTROS PCT: 74 %
PLATELETS: 187 10*3/uL (ref 150–400)
RBC: 4.6 MIL/uL (ref 3.87–5.11)
RDW: 14.2 % (ref 11.5–15.5)
WBC: 7.5 10*3/uL (ref 4.0–10.5)

## 2017-03-03 LAB — URINALYSIS, ROUTINE W REFLEX MICROSCOPIC
BILIRUBIN URINE: NEGATIVE
Glucose, UA: NEGATIVE mg/dL
KETONES UR: NEGATIVE mg/dL
Nitrite: NEGATIVE
PH: 6 (ref 5.0–8.0)
Protein, ur: NEGATIVE mg/dL
SPECIFIC GRAVITY, URINE: 1.018 (ref 1.005–1.030)

## 2017-03-03 MED ORDER — SODIUM CHLORIDE 0.9 % IV BOLUS (SEPSIS)
1000.0000 mL | Freq: Once | INTRAVENOUS | Status: AC
  Administered 2017-03-03: 1000 mL via INTRAVENOUS

## 2017-03-03 NOTE — ED Provider Notes (Signed)
[facility]-EMERGENCY DEPT Provider Note   CSN: [id number] Arrival date & time: 03/03/17  1506     History   Chief Complaint Chief Complaint  Patient presents with  . Dizziness  . Nausea    HPI [name] is a 66 y.o. female.  HPI  Patient presents with family members to provide history of present illness. Patient has a history of dementia, level V caveat. Family notes that the patient has had ongoing anorexia, nausea, generalized weakness, and today complained of lightheadedness. Currently the patient states that she is comfortable, denies pain, denies lightheadedness. She cannot specify why she felt lightheaded, or if there were any alleviating or exacerbating factors. She also cannot specify substantial details of her anorexia.    Past Medical History:  Diagnosis Date  . Arthritis   . Asthma    as a child   . Carpal tunnel syndrome    hx of   . Dementia   . Eczema   . High cholesterol   . High cholesterol   . Hypertension   . Psoriasis     Patient Active Problem List   Diagnosis Date Noted  . Unilateral primary osteoarthritis, left knee 08/31/2016  . Status post total left knee replacement 08/31/2016  . Major neurocognitive disorder 11/27/2015  . Alzheimer's dementia 11/02/2015  . Vertigo 05/02/2015  . Subacute confusional state 05/02/2015  . Acute encephalopathy   . Memory deficit   . Hyperlipidemia   . Prediabetes   . TIA (transient ischemic attack) 04/16/2015    Past Surgical History:  Procedure Laterality Date  . HAND SURGERY     Carpal tunnel  . KNEE SURGERY    . SHOULDER SURGERY    . TONSILLECTOMY    . TOTAL KNEE ARTHROPLASTY Left 08/31/2016   Procedure: LEFT TOTAL KNEE ARTHROPLASTY;  Surgeon: Kathryne Hitch, MD;  Location: [facility];  Service: Orthopedics;  Laterality: Left;    OB History    No data available       Home Medications    Prior to Admission medications   Medication Sig Start Date End Date Taking? Authorizing  Provider  aspirin 81 MG chewable tablet Chew 1 tablet (81 mg total) by mouth 2 (two) times daily after a meal. 09/03/16  Yes Kathryne Hitch, MD  losartan (COZAAR) 50 MG tablet Take 50 mg by mouth daily. 09/30/15  Yes [provider]  methocarbamol (ROBAXIN) 500 MG tablet Take 1 tablet (500 mg total) by mouth every 6 (six) hours as needed for muscle spasms. 11/28/16  Yes Kirtland Bouchard, PA-C  rosuvastatin (CRESTOR) 20 MG tablet Take 20 mg by mouth daily. 07/26/16  Yes [provider]  rivastigmine (EXELON) 13.3 MG/24HR Place 1 patch onto the skin every evening. 01/26/17   [provider]  temazepam (RESTORIL) 7.5 MG capsule Take 1 capsule (7.5 mg total) by mouth at bedtime as needed for sleep. Patient not taking: Reported on 03/03/2017 11/28/16   Kirtland Bouchard, PA-C    Family History Family History  Problem Relation Age of Onset  . Cancer Father     Social History Social History  Substance Use Topics  . Smoking status: Former Smoker    Quit date: 08/20/2004  . Smokeless tobacco: Never Used  . Alcohol use No     Allergies   Atorvastatin; Ibuprofen; and Nsaids   Review of Systems Review of Systems  Unable to perform ROS: Dementia     Physical Exam Updated Vital Signs BP (!) 153/92 (  BP Location: Right Arm)   Pulse 61   Temp 97.7 F (36.5 C) (Oral)   Resp 18   Wt 79.4 kg (175 lb)   SpO2 99%   BMI 29.12 kg/m   Physical Exam  Constitutional: She appears well-developed and well-nourished. No distress.  HENT:  Head: Normocephalic and atraumatic.  Eyes: Conjunctivae and EOM are normal.  Cardiovascular: Normal rate and regular rhythm.   Pulmonary/Chest: Effort normal and breath sounds normal. No stridor. No respiratory distress.  Abdominal: She exhibits no distension.  Musculoskeletal: She exhibits no edema.  Neurological: She is alert. She displays atrophy. No cranial nerve deficit. She exhibits normal muscle tone. She displays no seizure  activity.  Skin: Skin is warm and dry.  Psychiatric: Her speech is delayed. She is withdrawn. Cognition and memory are impaired.  Nursing note and vitals reviewed.    ED Treatments / Results  Labs (all labs ordered are listed, but only abnormal results are displayed) Labs Reviewed  COMPREHENSIVE METABOLIC PANEL - Abnormal; Notable for the following:       Result Value   Glucose, Bld 103 (*)    All other components within normal limits  URINALYSIS, ROUTINE W REFLEX MICROSCOPIC - Abnormal; Notable for the following:    Hgb urine dipstick SMALL (*)    Leukocytes, UA SMALL (*)    Bacteria, UA MANY (*)    Squamous Epithelial / LPF 0-5 (*)    All other components within normal limits  CBC WITH DIFFERENTIAL/PLATELET    EKG  EKG Interpretation  Date/Time:  Sunday March 03 2017 16:37:54 EDT Ventricular Rate:  62 PR Interval:    QRS Duration: 93 QT Interval:  459 QTC Calculation: 467 R Axis:   14 Text Interpretation:  Sinus rhythm T wave abnormality Artifact Abnormal ekg Confirmed by Gerhard MunchLockwood, Rishan Oyama [phone]([id number]) on 03/03/2017 4:39:52 PM       Radiology Dg Chest 2 View  Result Date: 03/03/2017 CLINICAL DATA:  Dizziness for the past week. Decreased fluid intake. EXAM: CHEST  2 VIEW COMPARISON:  None. FINDINGS: The lungs are clear. Heart size is normal. No pneumothorax or pleural effusion. Atherosclerosis noted. No focal bony abnormality. IMPRESSION: No acute disease. Atherosclerosis. Electronically Signed   By: Drusilla Kannerhomas  Dalessio M.D.   On: 03/03/2017 16:28    Procedures Procedures (including critical care time)  Medications Ordered in ED Medications  sodium chloride 0.9 % bolus 1,000 mL (not administered)     Initial Impression / Assessment and Plan / ED Course  I have reviewed the triage vital signs and the nursing notes.  Pertinent labs & imaging results that were available during my care of the patient were reviewed by me and considered in my medical decision making (see chart  for details).  6:50 PM On repeat exam the patient is in no distress. I discussed all findings with patient and 3 family members. We discussed the reassuring results, with no evidence for ongoing coronary ischemia, arrhythmia, no evidence for infection, pneumonia. Some suspicion for dehydration given the patient's acknowledgment of anorexia, poor oral intake. Patient has received IV fluids, and given lack of ongoing complaints, reassuring findings, the patient will follow up with primary care.   Final Clinical Impressions(s) / ED Diagnoses   Final diagnoses:  Lenward ChancellorLightheadedness      Jabar Krysiak, MD 03/03/17 1850

## 2017-03-03 NOTE — ED Notes (Signed)
Patient transported to X-ray 

## 2017-03-03 NOTE — ED Triage Notes (Addendum)
Pt from home with ems c.o N/V that has been intermittent for the past week. Pt also c.o dizziness that started today. Pt given 4mg  zofran en route. Pt A/o BP 170/98 HR 64 CBG 105. 20G LAC, nad.

## 2017-03-03 NOTE — Discharge Instructions (Signed)
As discussed, your evaluation today has been largely reassuring.  But, it is important that you monitor your condition carefully, and do not hesitate to return to the ED if you develop new, or concerning changes in your condition. ? ?Otherwise, please follow-up with your physician for appropriate ongoing care. ? ?

## 2017-03-05 DIAGNOSIS — G309 Alzheimer's disease, unspecified: Secondary | ICD-10-CM | POA: Diagnosis not present

## 2017-03-05 DIAGNOSIS — Z6829 Body mass index (BMI) 29.0-29.9, adult: Secondary | ICD-10-CM | POA: Diagnosis not present

## 2017-03-05 DIAGNOSIS — E860 Dehydration: Secondary | ICD-10-CM | POA: Diagnosis not present

## 2017-03-06 ENCOUNTER — Ambulatory Visit (INDEPENDENT_AMBULATORY_CARE_PROVIDER_SITE_OTHER): Payer: Medicare Other | Admitting: Physician Assistant

## 2017-03-06 ENCOUNTER — Encounter (INDEPENDENT_AMBULATORY_CARE_PROVIDER_SITE_OTHER): Payer: Self-pay | Admitting: Physician Assistant

## 2017-03-06 DIAGNOSIS — M170 Bilateral primary osteoarthritis of knee: Secondary | ICD-10-CM | POA: Diagnosis not present

## 2017-03-06 NOTE — Progress Notes (Signed)
Mrs. [name] returns today 6 months status post left total knee arthroplasty. She presents with her husband. They both state that she has some hypersensitivity to the knee. She is walking without any assistive devices. Still has some discomfort in the knee.  Left knee: Surgical incisions healing well no signs of infection. She has hypersensitivity with any manipulation of the knee particularly near the surgical incision. Full extension and 105 of flexion. No instability valgus varus stressing.  Impression: Status post left total knee arthroplasty 6 months  Plan: She'll work on scar tissue mobilization and desensitizing the knee. Continue work on range of motion strengthening. Follow up with us in 6 months for AP and lateral views of the knee. Questions encouraged and answered.

## 2017-06-24 DIAGNOSIS — I10 Essential (primary) hypertension: Secondary | ICD-10-CM | POA: Diagnosis not present

## 2017-06-24 DIAGNOSIS — R82998 Other abnormal findings in urine: Secondary | ICD-10-CM | POA: Diagnosis not present

## 2017-06-24 DIAGNOSIS — E781 Pure hyperglyceridemia: Secondary | ICD-10-CM | POA: Diagnosis not present

## 2017-07-01 DIAGNOSIS — Z0000 Encounter for general adult medical examination without abnormal findings: Secondary | ICD-10-CM | POA: Diagnosis not present

## 2017-07-01 DIAGNOSIS — G309 Alzheimer's disease, unspecified: Secondary | ICD-10-CM | POA: Diagnosis not present

## 2017-07-01 DIAGNOSIS — I6789 Other cerebrovascular disease: Secondary | ICD-10-CM | POA: Diagnosis not present

## 2017-07-01 DIAGNOSIS — F918 Other conduct disorders: Secondary | ICD-10-CM | POA: Diagnosis not present

## 2017-07-02 ENCOUNTER — Other Ambulatory Visit: Payer: Self-pay | Admitting: Internal Medicine

## 2017-07-02 DIAGNOSIS — Z1231 Encounter for screening mammogram for malignant neoplasm of breast: Secondary | ICD-10-CM

## 2017-07-19 DIAGNOSIS — Z1231 Encounter for screening mammogram for malignant neoplasm of breast: Secondary | ICD-10-CM | POA: Diagnosis not present

## 2017-07-19 DIAGNOSIS — Z01419 Encounter for gynecological examination (general) (routine) without abnormal findings: Secondary | ICD-10-CM | POA: Diagnosis not present

## 2017-07-19 DIAGNOSIS — B373 Candidiasis of vulva and vagina: Secondary | ICD-10-CM | POA: Diagnosis not present

## 2017-07-19 DIAGNOSIS — Z6830 Body mass index (BMI) 30.0-30.9, adult: Secondary | ICD-10-CM | POA: Diagnosis not present

## 2017-07-31 ENCOUNTER — Ambulatory Visit: Payer: Medicare Other

## 2017-08-14 ENCOUNTER — Other Ambulatory Visit: Payer: Self-pay | Admitting: Internal Medicine

## 2017-08-14 DIAGNOSIS — R591 Generalized enlarged lymph nodes: Secondary | ICD-10-CM | POA: Diagnosis not present

## 2017-08-14 DIAGNOSIS — Z6830 Body mass index (BMI) 30.0-30.9, adult: Secondary | ICD-10-CM | POA: Diagnosis not present

## 2017-08-15 ENCOUNTER — Other Ambulatory Visit: Payer: Medicare Other

## 2017-08-16 ENCOUNTER — Other Ambulatory Visit: Payer: Self-pay

## 2017-08-16 ENCOUNTER — Emergency Department (HOSPITAL_COMMUNITY)
Admission: EM | Admit: 2017-08-16 | Discharge: 2017-08-16 | Disposition: A | Discharge status: Home / Self Care | Payer: Medicare Other | Attending: Emergency Medicine | Admitting: Emergency Medicine

## 2017-08-16 ENCOUNTER — Encounter (HOSPITAL_COMMUNITY): Payer: Self-pay

## 2017-08-16 DIAGNOSIS — E7800 Pure hypercholesterolemia, unspecified: Secondary | ICD-10-CM | POA: Diagnosis not present

## 2017-08-16 DIAGNOSIS — R55 Syncope and collapse: Secondary | ICD-10-CM

## 2017-08-16 DIAGNOSIS — F0390 Unspecified dementia without behavioral disturbance: Secondary | ICD-10-CM | POA: Insufficient documentation

## 2017-08-16 DIAGNOSIS — R404 Transient alteration of awareness: Secondary | ICD-10-CM | POA: Diagnosis not present

## 2017-08-16 DIAGNOSIS — Z87891 Personal history of nicotine dependence: Secondary | ICD-10-CM | POA: Insufficient documentation

## 2017-08-16 DIAGNOSIS — Z7982 Long term (current) use of aspirin: Secondary | ICD-10-CM | POA: Insufficient documentation

## 2017-08-16 DIAGNOSIS — I10 Essential (primary) hypertension: Secondary | ICD-10-CM | POA: Insufficient documentation

## 2017-08-16 DIAGNOSIS — Z79899 Other long term (current) drug therapy: Secondary | ICD-10-CM | POA: Insufficient documentation

## 2017-08-16 LAB — CBC
HCT: 41.5 % (ref 36.0–46.0)
HEMOGLOBIN: 13.4 g/dL (ref 12.0–15.0)
MCH: 28.8 pg (ref 26.0–34.0)
MCHC: 32.3 g/dL (ref 30.0–36.0)
MCV: 89.1 fL (ref 78.0–100.0)
PLATELETS: 154 10*3/uL (ref 150–400)
RBC: 4.66 MIL/uL (ref 3.87–5.11)
RDW: 14.5 % (ref 11.5–15.5)
WBC: 7.8 10*3/uL (ref 4.0–10.5)

## 2017-08-16 LAB — BASIC METABOLIC PANEL
ANION GAP: 6 (ref 5–15)
BUN: 15 mg/dL (ref 6–20)
CHLORIDE: 104 mmol/L (ref 101–111)
CO2: 27 mmol/L (ref 22–32)
Calcium: 9.3 mg/dL (ref 8.9–10.3)
Creatinine, Ser: 0.83 mg/dL (ref 0.44–1.00)
GFR calc Af Amer: >60 mL/min (ref >60)
Glucose, Bld: 103 mg/dL — ABNORMAL HIGH (ref 65–99)
POTASSIUM: 4.2 mmol/L (ref 3.5–5.1)
SODIUM: 137 mmol/L (ref 135–145)

## 2017-08-16 MED ORDER — SODIUM CHLORIDE 0.9 % IV BOLUS (SEPSIS)
1000.0000 mL | Freq: Once | INTRAVENOUS | Status: AC
  Administered 2017-08-16: 1000 mL via INTRAVENOUS

## 2017-08-16 NOTE — ED Triage Notes (Signed)
Per EMS- patient was shopping with her husband and had a syncopal episode that lasted approx 30 seconds. Patient's husband stated he lowered the patient to the ground.

## 2017-08-16 NOTE — Discharge Instructions (Signed)
Follow up with your doctor next week.  Make sure to drink and eat well.  Return to the ED for recurrent symptoms

## 2017-08-16 NOTE — ED Notes (Signed)
Bed: WA21 Expected date:  Expected time:  Means of arrival:  Comments: 66 yo syncope

## 2017-08-16 NOTE — ED Notes (Signed)
Pt walked without difficulty  

## 2017-08-16 NOTE — ED Provider Notes (Signed)
[facility]-EMERGENCY DEPT Provider Note   CSN: [id number] Arrival date & time: 08/16/17  1550     History   Chief Complaint Chief Complaint  Patient presents with  . Loss of Consciousness    HPI [name] is a 66 y.o. female.  HPI Patient presents to the emergency room for evaluation of a syncopal episode.  History is somewhat limited by the patient's dementia.  Patient started off her conversation by stating that she has dementia and sometimes her memory is off.  She states she thinks she just slipped and fell.  She does not recall losing consciousness.  She denies any trouble with any headache or chest pain.  No hip pain.  No extremity pain.  She has no complaints of vomiting or diarrhea.  Additional history provided by husband when he arrives.  Pt was standing at the counter at a store.  She said she felt week and needed to eat get something to drink.  Pt then had a brief loc and he helped her down.  No injuries.  Pt regained alertness within about 30 seconds.  SOme slight shaking but no significant confusion afterwards.  Pt is back to normal now. Past Medical History:  Diagnosis Date  . Arthritis   . Asthma    as a child   . Carpal tunnel syndrome    hx of   . Dementia   . Eczema   . High cholesterol   . High cholesterol   . Hypertension   . Psoriasis     Patient Active Problem List   Diagnosis Date Noted  . Unilateral primary osteoarthritis, left knee 08/31/2016  . Status post total left knee replacement 08/31/2016  . Major neurocognitive disorder 11/27/2015  . Alzheimer's dementia 11/02/2015  . Vertigo 05/02/2015  . Subacute confusional state 05/02/2015  . Acute encephalopathy   . Memory deficit   . Hyperlipidemia   . Prediabetes   . TIA (transient ischemic attack) 04/16/2015    Past Surgical History:  Procedure Laterality Date  . HAND SURGERY     Carpal tunnel  . KNEE SURGERY    . SHOULDER SURGERY    . TONSILLECTOMY    .  TOTAL KNEE ARTHROPLASTY Left 08/31/2016   Procedure: LEFT TOTAL KNEE ARTHROPLASTY;  Surgeon: Kathryne Hitchhristopher Y Blackman, MD;  Location: [facility];  Service: Orthopedics;  Laterality: Left;    OB History    No data available       Home Medications    Prior to Admission medications   Medication Sig Start Date End Date Taking? Authorizing Provider  aspirin 81 MG chewable tablet Chew 1 tablet (81 mg total) by mouth 2 (two) times daily after a meal. 09/03/16  Yes Kathryne HitchBlackman, Christopher Y, MD  guaiFENesin (ROBITUSSIN) 100 MG/5ML liquid Take 200 mg by mouth 3 (three) times daily as needed for cough or congestion.   Yes [provider]  losartan (COZAAR) 50 MG tablet Take 50 mg by mouth daily. 09/30/15  Yes [provider]  rivastigmine (EXELON) 13.3 MG/24HR Place 1 patch onto the skin every evening. 01/26/17  Yes [provider]  rosuvastatin (CRESTOR) 20 MG tablet Take 20 mg by mouth daily. 07/26/16  Yes [provider]  methocarbamol (ROBAXIN) 500 MG tablet Take 1 tablet (500 mg total) by mouth every 6 (six) hours as needed for muscle spasms. Patient not taking: Reported on 08/16/2017 11/28/16   Kirtland Bouchardlark, Gilbert W, PA-C  temazepam (RESTORIL) 7.5 MG capsule Take 1 capsule (  7.5 mg total) by mouth at bedtime as needed for sleep. Patient not taking: Reported on 08/16/2017 11/28/16   Kirtland Bouchard, PA-C    Family History Family History  Problem Relation Age of Onset  . Cancer Father     Social History Social History   Tobacco Use  . Smoking status: Former Smoker    Last attempt to quit: 08/20/2004    Years since quitting: 12.9  . Smokeless tobacco: Never Used  Substance Use Topics  . Alcohol use: No    Alcohol/week: 0.0 oz  . Drug use: No     Allergies   Atorvastatin; Ibuprofen; and Nsaids   Review of Systems Review of Systems  All other systems reviewed and are negative.    Physical Exam Updated Vital Signs BP (!) 145/106   Pulse 75   Temp 98.3 F  (36.8 C) (Oral)   Resp 20   Ht 1.651 m (5\' 5" )   Wt 77.1 kg (170 lb)   SpO2 96%   BMI 28.29 kg/m   Physical Exam  Constitutional: She appears well-developed and well-nourished. No distress.  HENT:  Head: Normocephalic and atraumatic.  Right Ear: External ear normal.  Left Ear: External ear normal.  Eyes: Conjunctivae are normal. Right eye exhibits no discharge. Left eye exhibits no discharge. No scleral icterus.  Neck: Neck supple. No tracheal deviation present.  Cardiovascular: Normal rate, regular rhythm and intact distal pulses.  Pulmonary/Chest: Effort normal and breath sounds normal. No stridor. No respiratory distress. She has no wheezes. She has no rales.  Abdominal: Soft. Bowel sounds are normal. She exhibits no distension. There is no tenderness. There is no rebound and no guarding.  Musculoskeletal: She exhibits no edema or tenderness.  Neurological: She is alert. She has normal strength. No cranial nerve deficit (no facial droop, extraocular movements intact, no slurred speech) or sensory deficit. She exhibits normal muscle tone. She displays no seizure activity. Coordination normal.  Skin: Skin is warm and dry. No rash noted.  Psychiatric: She has a normal mood and affect.  Nursing note and vitals reviewed.    ED Treatments / Results  Labs (all labs ordered are listed, but only abnormal results are displayed) Labs Reviewed  BASIC METABOLIC PANEL - Abnormal; Notable for the following components:      Result Value   Glucose, Bld 103 (*)    All other components within normal limits  CBC    EKG  EKG Interpretation  Date/Time:  Friday August 16 2017 16:08:40 EST Ventricular Rate:  67 PR Interval:    QRS Duration: 86 QT Interval:  425 QTC Calculation: 449 R Axis:   48 Text Interpretation:  Sinus rhythm Borderline low voltage, extremity leads No significant change since last tracing Confirmed by Linwood Dibbles [id number]) on 08/16/2017 4:14:24 PM        Procedures Procedures (including critical care time)  Medications Ordered in ED Medications  sodium chloride 0.9 % bolus 1,000 mL (0 mLs Intravenous Stopped 08/16/17 1809)     Initial Impression / Assessment and Plan / ED Course  I have reviewed the triage vital signs and the nursing notes.  Pertinent labs & imaging results that were available during my care of the patient were reviewed by me and considered in my medical decision making (see chart for details).   Pt presented with a syncopal event.  No complaints at this time.  ED workup reassuring.  No history of cardiac disease or chf.  Etiology unclear, ?vasovagal. Pt  was able to walk without difficulty.  Pt and family are comfortable going home.  Warning signs and precautions discussed  Final Clinical Impressions(s) / ED Diagnoses   Final diagnoses:  Syncope and collapse    ED Discharge Orders    None       Linwood DibblesKnapp, Damica Gravlin, MD 08/16/17 Jerene Bears1920

## 2017-08-19 ENCOUNTER — Ambulatory Visit
Admission: RE | Admit: 2017-08-19 | Discharge: 2017-08-19 | Disposition: A | Discharge status: Home / Self Care | Payer: Medicare Other | Source: Ambulatory Visit | Attending: Internal Medicine | Admitting: Internal Medicine

## 2017-08-19 DIAGNOSIS — R599 Enlarged lymph nodes, unspecified: Secondary | ICD-10-CM | POA: Diagnosis not present

## 2017-08-19 DIAGNOSIS — R591 Generalized enlarged lymph nodes: Secondary | ICD-10-CM

## 2017-08-19 MED ORDER — IOPAMIDOL (ISOVUE-300) INJECTION 61%
75.0000 mL | Freq: Once | INTRAVENOUS | Status: AC | PRN
  Administered 2017-08-19: 75 mL via INTRAVENOUS

## 2017-08-26 ENCOUNTER — Ambulatory Visit (INDEPENDENT_AMBULATORY_CARE_PROVIDER_SITE_OTHER): Payer: Medicare Other

## 2017-08-26 ENCOUNTER — Ambulatory Visit (INDEPENDENT_AMBULATORY_CARE_PROVIDER_SITE_OTHER): Payer: Medicare Other | Admitting: Physician Assistant

## 2017-08-26 ENCOUNTER — Encounter (INDEPENDENT_AMBULATORY_CARE_PROVIDER_SITE_OTHER): Payer: Self-pay | Admitting: Physician Assistant

## 2017-08-26 DIAGNOSIS — Z96652 Presence of left artificial knee joint: Secondary | ICD-10-CM

## 2017-08-26 DIAGNOSIS — M7052 Other bursitis of knee, left knee: Secondary | ICD-10-CM

## 2017-08-26 MED ORDER — LIDOCAINE HCL 1 % IJ SOLN
2.0000 mL | INTRAMUSCULAR | Status: AC | PRN
Start: 2017-08-26 — End: 2017-08-26
  Administered 2017-08-26: 2 mL

## 2017-08-26 MED ORDER — METHYLPREDNISOLONE ACETATE 40 MG/ML IJ SUSP
40.0000 mg | INTRAMUSCULAR | Status: AC | PRN
  Administered 2017-08-26: 40 mg via INTRAMUSCULAR

## 2017-08-26 NOTE — Progress Notes (Signed)
Office Visit Note   Patient: [name]           Date of Birth: August 05, 1951           MRN: [id number] Visit Date: 08/26/2017              Requested by: Gaspar Garbeisovec, Richard W, MD [address] [place], [place] [id number] PCP: Wylene Simmerisovec, Adelfa Kohichard W, MD   Assessment & Plan: Visit Diagnoses:  1. S/P total knee arthroplasty, left   2. Pes anserinus bursitis of left knee     Plan: We will have her follow-up with us in 1 month.  She will work on range of motion strengthening.  Vascular to go on Tylenol arthritis instead of just taking aspirin.  She is unable to take any NSAIDs.  Follow-Up Instructions: Return in about 4 weeks (around 09/23/2017).   Orders:  Orders Placed This Encounter  Procedures  . Large Joint Inj  . Trigger Point Inj  . XR Knee 1-2 Views Left   No orders of the defined types were placed in this encounter.     Procedures: Trigger Point Inj Date/Time: 08/26/2017 8:43 AM Performed by: Kirtland Bouchardlark, Curby Carswell W, PA-C Authorized by: Kirtland Bouchardlark, Ennis Heavner W, PA-C   Consent Given by:  Patient Total # of Trigger Points:  1 Location: lower extremity   Needle Size:  22 G Medications #1:  2 mL lidocaine 1 %; 40 mg methylPREDNISolone acetate 40 MG/ML Patient tolerance:  Patient tolerated the procedure well with no immediate complications     Clinical Data: No additional findings.   Subjective: Chief Complaint  Patient presents with  . Left Knee - Follow-up    HPI [name] returns today 1 year status post left total knee arthroplasty.  She states that the knee hurts all the time.  States she is doing terrible.  She is having swelling mainly medial aspect of the knee.  She been taking aspirin for pain.  Has been accompanying her.  She has Alzheimer's.  He feels that her Alzheimer's may be playing a role in some of her pain. Review of Systems No fevers chills shortness of breath chest pain.  No new injury to the knee  Objective: Vital Signs: There were no vitals taken  for this visit.  Physical Exam  Constitutional: She is oriented to person, place, and time. She appears well-developed and well-nourished. No distress.  Pulmonary/Chest: Effort normal.  Neurological: She is alert and oriented to person, place, and time.  Skin: She is not diaphoretic.  Psychiatric: She has a normal mood and affect.    Ortho Exam Left knee well-healed surgical incision.  No signs of gross infection.  Slight edema medially.  No effusion.  No instability valgus varus stressing and anterior drawer is negative.  Full extension flexion to approximately 105 degrees.  She has maximal tenderness over the pes anserinus is region. Specialty Comments:  No specialty comments available.  Imaging: Xr Knee 1-2 Views Left  Result Date: 08/26/2017 Radiographs left knee AP lateral views: No acute fracture.  Well-seated total knee arthroplasty without any signs of complications.    PMFS History: Patient Active Problem List   Diagnosis Date Noted  . Unilateral primary osteoarthritis, left knee 08/31/2016  . Status post total left knee replacement 08/31/2016  . Major neurocognitive disorder 11/27/2015  . Alzheimer's dementia 11/02/2015  . Vertigo 05/02/2015  . Subacute confusional state 05/02/2015  . Acute encephalopathy   . Memory deficit   . Hyperlipidemia   . Prediabetes   .  TIA (transient ischemic attack) 04/16/2015   Past Medical History:  Diagnosis Date  . Arthritis   . Asthma    as a child   . Carpal tunnel syndrome    hx of   . Dementia   . Eczema   . High cholesterol   . High cholesterol   . Hypertension   . Psoriasis     Family History  Problem Relation Age of Onset  . Cancer Father     Past Surgical History:  Procedure Laterality Date  . HAND SURGERY     Carpal tunnel  . KNEE SURGERY    . SHOULDER SURGERY    . TONSILLECTOMY    . TOTAL KNEE ARTHROPLASTY Left 08/31/2016   Procedure: LEFT TOTAL KNEE ARTHROPLASTY;  Surgeon: Kathryne Hitch, MD;   Location: [facility];  Service: Orthopedics;  Laterality: Left;   Social History   Occupational History  . Not on file  Tobacco Use  . Smoking status: Former Smoker    Last attempt to quit: 08/20/2004    Years since quitting: 13.0  . Smokeless tobacco: Never Used  Substance and Sexual Activity  . Alcohol use: No    Alcohol/week: 0.0 oz  . Drug use: No  . Sexual activity: Yes

## 2017-09-02 ENCOUNTER — Ambulatory Visit: Payer: Medicare Other | Admitting: Neurology

## 2017-09-02 ENCOUNTER — Encounter: Payer: Self-pay | Admitting: Neurology

## 2017-09-02 VITALS — BP 143/84 | HR 63 | Ht 65.0 in | Wt 185.4 lb

## 2017-09-02 DIAGNOSIS — F0280 Dementia in other diseases classified elsewhere without behavioral disturbance: Secondary | ICD-10-CM

## 2017-09-02 DIAGNOSIS — G300 Alzheimer's disease with early onset: Secondary | ICD-10-CM | POA: Diagnosis not present

## 2017-09-02 MED ORDER — MEMANTINE HCL 5 MG PO TABS: ORAL_TABLET | ORAL | 4 refills | Status: AC

## 2017-09-02 MED ORDER — RIVASTIGMINE 13.3 MG/24HR TD PT24: 13.3000 mg | MEDICATED_PATCH | Freq: Every evening | TRANSDERMAL | 12 refills | Status: AC

## 2017-09-02 NOTE — Patient Instructions (Addendum)
Memantine (Namenda): Start with one pill daily for 2 weeks. If no side effects increase to one pill twice daily.   Memantine Tablets What is this medicine? MEMANTINE (MEM an teen) is used to treat dementia caused by Alzheimer's disease. This medicine may be used for other purposes; ask your health care provider or pharmacist if you have questions. COMMON BRAND NAME(S): Namenda What should I tell my health care provider before I take this medicine? They need to know if you have any of these conditions: -difficulty passing urine -kidney disease -liver disease -seizures -an unusual or allergic reaction to memantine, other medicines, foods, dyes, or preservatives -pregnant or trying to get pregnant -breast-feeding How should I use this medicine? Take this medicine by mouth with a glass of water. Follow the directions on the prescription label. You may take this medicine with or without food. Take your doses at regular intervals. Do not take your medicine more often than directed. Continue to take your medicine even if you feel better. Do not stop taking except on the advice of your doctor or health care professional. Talk to your pediatrician regarding the use of this medicine in children. Special care may be needed. Overdosage: If you think you have taken too much of this medicine contact a poison control center or emergency room at once. NOTE: This medicine is only for you. Do not share this medicine with others. What if I miss a dose? If you miss a dose, take it as soon as you can. If it is almost time for your next dose, take only that dose. Do not take double or extra doses. If you do not take your medicine for several days, contact your health care provider. Your dose may need to be changed. What may interact with this  medicine? -acetazolamide -amantadine -cimetidine -dextromethorphan -dofetilide -hydrochlorothiazide -ketamine -metformin -methazolamide -quinidine -ranitidine -sodium bicarbonate -triamterene This list may not describe all possible interactions. Give your health care provider a list of all the medicines, herbs, non-prescription drugs, or dietary supplements you use. Also tell them if you smoke, drink alcohol, or use illegal drugs. Some items may interact with your medicine. What should I watch for while using this medicine? Visit your doctor or health care professional for regular checks on your progress. Check with your doctor or health care professional if there is no improvement in your symptoms or if they get worse. You may get drowsy or dizzy. Do not drive, use machinery, or do anything that needs mental alertness until you know how this drug affects you. Do not stand or sit up quickly, especially if you are an older patient. This reduces the risk of dizzy or fainting spells. Alcohol can make you more drowsy and dizzy. Avoid alcoholic drinks. What side effects may I notice from receiving this medicine? Side effects that you should report to your doctor or health care professional as soon as possible: -allergic reactions like skin rash, itching or hives, swelling of the face, lips, or tongue -agitation or a feeling of restlessness -depressed mood -dizziness -hallucinations -redness, blistering, peeling or loosening of the skin, including inside the mouth -seizures -vomiting Side effects that usually do not require medical attention (report to your doctor or health care professional if they continue or are bothersome): -constipation -diarrhea -headache -nausea -trouble sleeping This list may not describe all possible side effects. Call your doctor for medical advice about side effects. You may report side effects to FDA at 1-800-FDA-1088. Where should I keep my medicine? Keep  out of the reach of children. Store at room temperature between 15 degrees and 30 degrees C (59 degrees and 86 degrees F). Throw away any unused medicine after the expiration date. NOTE: This sheet is a summary. It may not cover all possible information. If you have questions about this medicine, talk to your doctor, pharmacist, or health care provider.  2018 [employer]vier/Gold Standard (2013-05-25 14:10:42)

## 2017-09-02 NOTE — Progress Notes (Signed)
[name]    Provider:  Dr [name] Referring Provider: Wylene Sims Yvette Koh, MD Primary Care Physician:  Yvette Garbe, MD  CC: memory loss  Interval history 09/02/2017: Patient here for follow up of Alzheimer's dementia. On exelon, did not tolerate Aricept. Here with her daughter. MMSE declined 16/30. Her daughter [name] says she is still "spunky", mood is good.  She ison the highest dose of Rivastigmine. Her husband still works. They are moving closer to daughter.   Interval history 05/07/2016:  She is on Exelon and she is doing great. Here with husband.  She is on 4.6mg . She love sit she is doing well. She is doing well on the current dose and does not want to increase. She has been on the Exelon for 4-5 months. She did not tolerate the Aricept. She feels she is better, patient's husband goes to work every day, she drives and does not get lost and has not had any issues at all. Husband is present with her when she drives and she drives well, no problems, no getting lost, no confusion. Husband is here. He calls her throughout the day. She is doing well at home, no issues in the house and no accidents in the home. She is walking and staying active. Discussed FDG brain scan.    FDG PET Scan: Biparietal decreased cortical metabolism greater on the LEFT coupled with more subtle decreased bitemporal cortical metabolism would be consistent with an Alzheimer's pathology pattern.  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  Her scores with predominantly memory disruption and executive dysfunction, as well as the pattern of her language scores, with intact letter fluency but impairment in confrontation naming and semantic fluency, is suggestive of temporal lobe dysfunction as would be seen in Alzheimer's disease. The reported course  of her symptoms is consistent with what is expected in Alzheimer's disease. It is also unexpected that the level of cognitive disruption that she is fully managing her ADLs such as finances without trouble. At this time there is no evidence of potential new disruption that would adversely impact her cognitive functioning. Patient should attempt to minimize distractions in her environment. She should not be driving.  Interval history 06/27/2015: Here for follow up on memory loss. EEG showed diffuse background theta frequency slowing. Lab testing was unremarkable including rpr, hiv, ammonia, b12, tsh. They followed up with primary care and pcp mentioned vascular dementia was discussed however I stated MRi of the brain was normal for age and MRA of the brain was unremarkable, no significant white matter changes or vascular processes were seen. Will send for neuropsychological testing. She has been under a lot of stress with her husband, husband is an alcoholic. His drinking has worsened. Patient's MMSE was 24/30 at last appointment. Will perform a MoCA today. She has had some vaginal discomfort but no UTI as far as they know. Her cholesterol was checked. They did bloodwork. No other metabolic/toxic causes of memory loss or eeg slowing was found. BP is 165/90 with P66, advised to call primary care and consider adding medication for her elevated BP. She snores, she is excessively tired during the day, she needs to nap. No dementia in the family, mother is 5 and father died in 23s.   HPI: [name] is a 67 y.o. female here as a referral from [name] for a TIA. She is here with  daughter. Daughter said she staggered, head was hanging down, said she was really dizzy, slurring of speech, reported room was spinning. Patient was in her closet trying to figure out what to wear. Patient doesn't remember any of it. No previous incidents of this type. Daughter said she will go to the grocery store and not get  anything. She will forget how to set the alarm. Since this incident, patient is more confused. She has not been to a doctor in years previous to hospitalization. Patient denies any new medications. Two weeks before this happened she had surgery on her hand for Dupuytren's contracture. She gets dizzy and confused in the grocery store. Patient doesn't remember a lot of these things. More short-term memory loss. Things that happened a long time she remembers. They believe patient's mother may have Alzheimers. No depression or mood disorder. No new medications. Memory problems started 6-12 months ago and getting worse. Aspirin had been held for surgery. Started statin in the hospital. Denies headache, any current neurologic symptoms, CP, SOB, visual changes. She is having episodic vertigo and memory changes as above.   Reviewed notes, labs and imaging from outside physicians, which showed:  Per neurology notes inpatient: [name] is a 67 y.o. female who believed to be healthy but has not had any regular medical follow-up for several years. She had surgery on her right hand approximately one week ago performed by [name] for a Dupuytren's contracture.The patient's daughter drove over to her mother's house to pick up something. Her mother walked out of the house towards the car and her daughter noted that her mother appeared to be staggering.She helped her mother back in to the house, sat her down, and called 911. The daughter noticed some mild dysarthriaand said that this was also noted by the EMTs.She also felt that her mother was mildly confused. The patient felt as if theroom was spinning. The patient herself did not remember much about the incident. She remembers working in the house and getting ready to go out to the car; however, she is foggy about the details. The patient's speech was back to normal in the emergency department, although she still seemed to have some mild cognitive  difficulties. When her daughter tried to help her mother get up and go to the bathroom in the ER the patient was once again dizzy. She does not feel dizzy lying down. The patient's daughter estimates that the worst of these symptoms lasted approximately 2 hours. The patient denied any recent headaches, palpitations, nausea or vomiting, visual changes, speech difficulties, changes in hearing, abnormal sounds or ringing in her ears.  The patient's husband and daughter both report that the patient's memory has been getting progressively worse over the past 6months to one year. She had been taking aspirin 81 mg daily but this was recently held for her hand surgery.  MRI HEAD FINDINGS personally reviewed images and agree with the following.   The ventricles and sulci are normal for patient's age. No abnormal parenchymal signal, mass lesions, mass effect. No reduced diffusion to suggest acute ischemia. No susceptibility artifact to suggest hemorrhage.  No abnormal extra-axial fluid collections. No extra-axial masses though, contrast enhanced sequences would be more sensitive. Normal major intracranial vascular flow voids seen at the skull base.  Ocular globes and orbital contents are unremarkable though not tailored for evaluation. No abnormal sellar expansion. Visualized paranasal sinuses and mastoid air cells are well-aerated, LEFT concha bullosa. No suspicious calvarial bone marrow signal. No abnormal  sellar expansion. Craniocervical junction maintained.  MRA HEAD FINDINGS  Anterior circulation: Normal flow related enhancement of the included cervical, petrous, cavernous and supra clinoid internal carotid arteries. Very mild luminal irregularity LEFT cavernous carotid artery corresponding to calcific atherosclerosis on prior head CT. Patent anterior communicating artery. Normal flow related enhancement of the anterior and middle cerebral arteries, including more distal segments.  No large  vessel occlusion, high-grade stenosis, abnormal luminal irregularity, aneurysm.  Posterior circulation: LEFT vertebral artery is dominant. Basilar artery is patent, with normal flow related enhancement of the main branch vessels. Fetal origin RIGHT posterior cerebral artery. Small LEFT posterior communicating artery present. Normal flow related enhancement of the posterior cerebral arteries.  No large vessel occlusion, high-grade stenosis, abnormal luminal irregularity, aneurysm.  IMPRESSION: MRI HEAD: No acute intracranial process; normal noncontrast MRI of the brain for age.  MRA HEAD: No acute large vessel occlusion or high-grade stenosis ; complete circle of Willis.  hgba1c 6 LDL unable to calculate given such high triglycerides B12 349 TSH 2.5 CBC,CMP unremarkable  Study Conclusions  - Left ventricle: The cavity size was normal. There was moderate concentric hypertrophy. Systolic function was normal. The estimated ejection fraction was in the range of 55% to 60%. Wall motion was normal; there were no regional wall motion abnormalities.  Impressions:  - No cardiac source of emboli was indentified.  Review of Systems: Patient complains of symptoms per HPI as well as the following symptoms: weight loss, CP, snoring, rash, confusion, dizziness. Pertinent negatives per HPI. All others negative.      Social History   Socioeconomic History  . Marital status: Married    Spouse name: Not on file  . Number of children: 2  . Years of education: 34  . Highest education level: Not on file  Social Needs  . Financial resource strain: Not on file  . Food insecurity - worry: Not on file  . Food insecurity - inability: Not on file  . Transportation needs - medical: Not on file  . Transportation needs - non-medical: Not on file  Occupational History  . Not on file  Tobacco Use  . Smoking status: Former Smoker    Last attempt to quit: 08/20/2004    Years since  quitting: 13.0  . Smokeless tobacco: Never Used  Substance and Sexual Activity  . Alcohol use: No    Alcohol/week: 0.0 oz  . Drug use: No  . Sexual activity: Yes  Other Topics Concern  . Not on file  Social History Narrative   Lives at home with husband.    Drinks coffee daily, 1-2 cups daily   Left handed      Family History  Problem Relation Age of Onset  . Cancer Father     Past Medical History:  Diagnosis Date  . Arthritis   . Asthma    as a child   . Carpal tunnel syndrome    hx of   . Dementia   . Eczema   . High cholesterol   . High cholesterol   . Hypertension   . Psoriasis     Past Surgical History:  Procedure Laterality Date  . HAND SURGERY     Carpal tunnel  . KNEE SURGERY    . SHOULDER SURGERY    . TONSILLECTOMY    . TOTAL KNEE ARTHROPLASTY Left 08/31/2016   Procedure: LEFT TOTAL KNEE ARTHROPLASTY;  Surgeon: Kathryne Hitch, MD;  Location: [facility];  Service: Orthopedics;  Laterality: Left;  Current Outpatient Medications  Medication Sig Dispense Refill  . aspirin 81 MG chewable tablet Chew 1 tablet (81 mg total) by mouth 2 (two) times daily after a meal. 30 tablet 0  . losartan (COZAAR) 50 MG tablet Take 50 mg by mouth daily.  5  . rivastigmine (EXELON) 13.3 MG/24HR Place 1 patch onto the skin every evening.  4  . rosuvastatin (CRESTOR) 20 MG tablet Take 20 mg by mouth daily.  11   No current facility-administered medications for this visit.     Allergies as of 09/02/2017 - Review Complete 09/02/2017  Allergen Reaction Noted  . Atorvastatin Other (See Comments) 11/22/2015  . Ibuprofen Itching and Nausea And Vomiting 03/28/2013  . Nsaids Other (See Comments) 11/22/2015    Vitals: BP (!) 143/84 (BP Location: Right Arm, Patient Position: Sitting)   Pulse 63   Ht 5\' 5"  (1.651 m)   Wt 185 lb 6.4 oz (84.1 kg)   BMI 30.85 kg/m  Last Weight:  Wt Readings from Last 1 Encounters:  09/02/17 185 lb 6.4 oz (84.1 kg)   Last Height:   Ht  Readings from Last 1 Encounters:  09/02/17 5\' 5"  (1.651 m)    Physical exam: Exam: Gen: NAD, conversant, Eyes: Conjunctivae clear without exudates or hemorrhage  Neuro: Detailed Neurologic Exam  Speech:    Speech is normal; fluent and spontaneous with normal comprehension.  Cognition: MMSE - Mini Mental State Exam 09/02/2017 05/07/2016 06/27/2015  Orientation to time 0 2 4  Orientation to Place 4 5 5   Registration 3 3 3   Attention/ Calculation 2 2 4   Recall 0 0 0  Language- name 2 objects 2 2 2   Language- repeat 1 1 1   Language- follow 3 step command 3 3 3   Language- read & follow direction 0 1 1  Write a sentence 1 1 1   Copy design 0 1 1  Total score 16 21 25    Cranial Nerves:    The pupils are equal, round, and reactive to light.  Visual fields are full to finger confrontation. Extraocular movements are intact. Trigeminal sensation is intact and the muscles of mastication are normal. The face is symmetric. The palate elevates in the midline. Hearing intact. Voice is normal. Shoulder shrug is normal. The tongue has normal motion without fasciculations.   Motor Observation:    No asymmetry, no atrophy, and no involuntary movements noted. Tone:    Normal muscle tone.    Posture:    Posture is normal. normal erect    Strength:    Strength is V/V in the upper and lower limbs.         Assessment/Plan:  ASSESSMENT/PLAN [name] is a 67 y.o. female with history of his tobacco history, psoriasis, and recent right hand surgery presented with transient staggering gait, dizziness, dysarthria, and mild confusion with progressive memory issues. Imaging was negative and patient was diagnosed with possible TIA. Neurocognitive testing and FDG brain scan diagnosed Alzheimer's Dementia.   MMSE declined to 16/30 (was 25/30 06/2015)   - Continue Exelon, start Namenda.   - FDG PET Scan was consistent with  an Alzheimer's pathology pattern.  - Neurocognitive testing c/w  Major Neurocognitive disorder likely Alzheimer's dementia, given the magnitude of breath of cognitive impairment it is surprising MRI of her brain was unremarkable.. In discussion with patient, her son and daughterat last appointment. Patient was very upset as understandable. Discussed Alzheimer's and the natural progression.   Given her market visual spatial and  executive disruption it is recommended she refrain from driving Her neurocognitive report. Unless she passed formal driving evaluation. Provided a referral for patient. If she should not drive until she has is completed.She is driving, I recommend her not driving as well discussed today.   MRI and MRA showed no acute processes. MRI of the brain was essentially normal.no significant white matter changes or vascular processes were seen. TSH and B12 are normal. No toxic/metabolic anomalies.  Neeeds to follow closely with primary care for management of vascular risk factors. Continue aspirin daily for stroke prevention.Naomie Dean, MD  [facility] [address] [place], [place] [address]  Phone [phone] Fax [phone]  A total of 15 minutes was spent face-to-face with this patient. Over half this time was spent on counseling patient on the alzheimer dementia diagnosis and different diagnostic and therapeutic options available.

## 2017-09-26 ENCOUNTER — Ambulatory Visit (INDEPENDENT_AMBULATORY_CARE_PROVIDER_SITE_OTHER): Payer: Medicare Other | Admitting: Orthopaedic Surgery

## 2017-09-26 ENCOUNTER — Telehealth: Payer: Self-pay | Admitting: Neurology

## 2017-09-26 NOTE — Telephone Encounter (Signed)
Pt's husband said they have moved and misplaced the handicapp placard. He is wanting to get another.

## 2017-09-26 NOTE — Telephone Encounter (Signed)
Called [name] back and LVM asking for call. When he calls please inform him that [facility] if they still have the information on file.

## 2017-10-07 ENCOUNTER — Ambulatory Visit (INDEPENDENT_AMBULATORY_CARE_PROVIDER_SITE_OTHER): Payer: Medicare Other | Admitting: Physician Assistant

## 2017-10-14 ENCOUNTER — Ambulatory Visit (INDEPENDENT_AMBULATORY_CARE_PROVIDER_SITE_OTHER): Payer: Medicare Other | Admitting: Orthopaedic Surgery

## 2017-10-14 ENCOUNTER — Encounter (INDEPENDENT_AMBULATORY_CARE_PROVIDER_SITE_OTHER): Payer: Self-pay | Admitting: Orthopaedic Surgery

## 2017-10-14 DIAGNOSIS — Z96652 Presence of left artificial knee joint: Secondary | ICD-10-CM

## 2017-10-14 MED ORDER — METHYLPREDNISOLONE 4 MG PO TABS: ORAL_TABLET | ORAL | 0 refills | Status: DC

## 2017-10-14 MED ORDER — GABAPENTIN 300 MG PO CAPS: 300.0000 mg | ORAL_CAPSULE | Freq: Three times a day (TID) | ORAL | 0 refills | Status: DC

## 2017-10-14 NOTE — Progress Notes (Signed)
The patient is well-known to us.  She does have mild to moderate Alzheimer's disease but she is 13 months status post left total knee arthroplasty.  She is having significant pain with that knee and I do not think it solid component of Alzheimer's disease.  She is having significant sensitivity issues over the knee incision and lateral to the knee which can be common after knee replacement surgery.  We  at work on trying to get this to calm down.  We then provide a steroid injection in the pes bursa area at her last visit and that did not help.  She does not like to have anything laying on her knee.  On exam her incision is well-healed.  Her swelling is minimal.  She has full extension to only about 95 degrees flexion so there is a component of scar in her knee.  She is very sensitive to the lateral aspect of her knee and her incision in general.  At this point I would like to try Neurontin 300 mg to take at bedtime and then to eventually increase this up to 3 times a day and I talked her husband at length about how to taper this up.  She will start with 300 mg at bedtime for about 5 days and then go to twice a day for a week and then 3 times a day as needed.  Also want to try a combination of Biofreeze and Aspercreme with lidocaine.  We will also try to a 6-day steroid taper.  We will see her back in about 2 months see how she is doing overall.  [facility] [place] [facility] [place] [facility] [place] we may consider setting her up for outpatient physical therapy to try to desensitize the knee if needed.  We will see her back in 2 months to see how she is doing overall.

## 2017-10-26 ENCOUNTER — Other Ambulatory Visit (INDEPENDENT_AMBULATORY_CARE_PROVIDER_SITE_OTHER): Payer: Self-pay | Admitting: Orthopaedic Surgery

## 2017-10-28 NOTE — Telephone Encounter (Signed)
Please advise 

## 2017-12-12 ENCOUNTER — Encounter (INDEPENDENT_AMBULATORY_CARE_PROVIDER_SITE_OTHER): Payer: Self-pay | Admitting: Orthopaedic Surgery

## 2017-12-12 ENCOUNTER — Ambulatory Visit (INDEPENDENT_AMBULATORY_CARE_PROVIDER_SITE_OTHER): Payer: Medicare Other | Admitting: Orthopaedic Surgery

## 2017-12-12 DIAGNOSIS — Z96652 Presence of left artificial knee joint: Secondary | ICD-10-CM

## 2017-12-12 DIAGNOSIS — G8929 Other chronic pain: Secondary | ICD-10-CM

## 2017-12-12 DIAGNOSIS — M25562 Pain in left knee: Secondary | ICD-10-CM

## 2017-12-12 MED ORDER — METHYLPREDNISOLONE ACETATE 40 MG/ML IJ SUSP
40.0000 mg | INTRAMUSCULAR | Status: AC | PRN
  Administered 2017-12-12: 40 mg via INTRA_ARTICULAR

## 2017-12-12 MED ORDER — LIDOCAINE HCL 1 % IJ SOLN
3.0000 mL | INTRAMUSCULAR | Status: AC | PRN
  Administered 2017-12-12: 3 mL

## 2017-12-12 MED ORDER — DICLOFENAC SODIUM 1 % TD GEL: 2.0000 g | Freq: Four times a day (QID) | TRANSDERMAL | 3 refills | Status: DC

## 2017-12-12 NOTE — Progress Notes (Signed)
Office Visit Note   Patient: [name]           Date of Birth: Nov 23, 1950           MRN: [id number] Visit Date: 12/12/2017              Requested by: Gaspar Garbe, MD [address] [place], [place] [address] PCP: Gaspar Garbe, MD   Assessment & Plan: Visit Diagnoses:  1. Status post total left knee replacement     Plan: I did send in some Voltaren gel to her pharmacy and hopefully this will not be contraindicated given her allergies to traditional oral anti-inflammatories this mainly just nausea.  Her husband wanted [place] to try this as well and I agree.  She can try this on her knee as well as her trochanteric area anywhere from 2-4 times a day.  Also decided to be worth trying a one-time intra-articular steroid injection in her left knee.  I provided this without difficulty after explained the risk and benefits of steroids and a total knee replacement.  Afterwards she walked around the room better.  I talked her about her trying a cane in her opposite hand for a while.  I will see her back in 3 months to see how she is doing overall.  I would like an AP and lateral of her left knee at that visit.  Follow-Up Instructions: Return in about 3 months (around 03/13/2018).   Orders:  No orders of the defined types were placed in this encounter.  Meds ordered this encounter  Medications  . diclofenac sodium (VOLTAREN) 1 % GEL    Sig: Apply 2 g topically 4 (four) times daily.    Dispense:  100 g    Refill:  3      Procedures: Large Joint Inj: L knee on 12/12/2017 2:12 PM Indications: diagnostic evaluation and pain Details: 22 G 1.5 in needle, superolateral approach  Arthrogram: No  Medications: 3 mL lidocaine 1 %; 40 mg methylPREDNISolone acetate 40 MG/ML Outcome: tolerated well, no immediate complications Procedure, treatment alternatives, risks and benefits explained, specific risks discussed. Consent was given by the patient. Immediately prior to procedure a time  out was called to verify the correct patient, procedure, equipment, support staff and site/side marked as required. Patient was prepped and draped in the usual sterile fashion.       Clinical Data: No additional findings.   Subjective: Chief Complaint  Patient presents with  . Left Knee - Follow-up  The patient is well-known to me.  She is now 15 months out from a left total knee arthroplasty.  She still been dealing with postoperative pain and pain in general with that left knee.  We have x-rayed it before and did not find any worrisome features.  She does have moderate Alzheimer's disease and sometimes this does affect her pain centers.  She is on Neurontin and has been helpful.  She cannot take traditional anti-inflammatories.  Her husband is with her.  She does report she is having some pain in her left hip and points the trochanteric area.  She still has problems which is walking sometimes in general due to her knee pain.  She does not use a cane.  HPI  Review of Systems She currently denies any headache, chest pain, shortness of breath, fever, chills, nausea, vomiting.  Objective: Vital Signs: There were no vitals taken for this visit.  Physical Exam She is alert and oriented and follows commands  appropriately. Ortho Exam Examination of her left knee shows just some mild swelling but no redness.  She has almost full extension to well past 100 degrees flexion.  The knee feels ligamentously stable.  She has a lot of guarding when I move her knee around. Specialty Comments:  No specialty comments available.  Imaging: No results found.   PMFS History: Patient Active Problem List   Diagnosis Date Noted  . Unilateral primary osteoarthritis, left knee 08/31/2016  . Status post total left knee replacement 08/31/2016  . Major neurocognitive disorder 11/27/2015  . Alzheimer's dementia 11/02/2015  . Vertigo 05/02/2015  . Subacute confusional state 05/02/2015  . Acute  encephalopathy   . Memory deficit   . Hyperlipidemia   . Prediabetes   . TIA (transient ischemic attack) 04/16/2015   Past Medical History:  Diagnosis Date  . Arthritis   . Asthma    as a child   . Carpal tunnel syndrome    hx of   . Dementia   . Eczema   . High cholesterol   . High cholesterol   . Hypertension   . Psoriasis     Family History  Problem Relation Age of Onset  . Cancer Father     Past Surgical History:  Procedure Laterality Date  . HAND SURGERY     Carpal tunnel  . KNEE SURGERY    . SHOULDER SURGERY    . TONSILLECTOMY    . TOTAL KNEE ARTHROPLASTY Left 08/31/2016   Procedure: LEFT TOTAL KNEE ARTHROPLASTY;  Surgeon: Kathryne Hitchhristopher Y Szymon Foiles, MD;  Location: [facility];  Service: Orthopedics;  Laterality: Left;   Social History   Occupational History  . Not on file  Tobacco Use  . Smoking status: Former Smoker    Last attempt to quit: 08/20/2004    Years since quitting: 13.3  . Smokeless tobacco: Never Used  Substance and Sexual Activity  . Alcohol use: No    Alcohol/week: 0.0 oz  . Drug use: No  . Sexual activity: Yes

## 2017-12-13 ENCOUNTER — Telehealth (INDEPENDENT_AMBULATORY_CARE_PROVIDER_SITE_OTHER): Payer: Self-pay | Admitting: Orthopaedic Surgery

## 2017-12-13 NOTE — Telephone Encounter (Signed)
[name] [employer] called asking for more information about a prior authorization on a medication. CB # [phone] option 5

## 2017-12-13 NOTE — Telephone Encounter (Signed)
Prior auth done for this patient

## 2017-12-23 ENCOUNTER — Other Ambulatory Visit (INDEPENDENT_AMBULATORY_CARE_PROVIDER_SITE_OTHER): Payer: Self-pay | Admitting: Orthopaedic Surgery

## 2017-12-24 NOTE — Telephone Encounter (Signed)
Please advise 

## 2018-01-23 DIAGNOSIS — I10 Essential (primary) hypertension: Secondary | ICD-10-CM | POA: Diagnosis not present

## 2018-01-23 DIAGNOSIS — I6789 Other cerebrovascular disease: Secondary | ICD-10-CM | POA: Diagnosis not present

## 2018-01-23 DIAGNOSIS — R2689 Other abnormalities of gait and mobility: Secondary | ICD-10-CM | POA: Diagnosis not present

## 2018-01-23 DIAGNOSIS — G309 Alzheimer's disease, unspecified: Secondary | ICD-10-CM | POA: Diagnosis not present

## 2018-03-11 ENCOUNTER — Ambulatory Visit (INDEPENDENT_AMBULATORY_CARE_PROVIDER_SITE_OTHER): Payer: Medicare Other

## 2018-03-11 ENCOUNTER — Encounter (INDEPENDENT_AMBULATORY_CARE_PROVIDER_SITE_OTHER): Payer: Self-pay | Admitting: Orthopaedic Surgery

## 2018-03-11 ENCOUNTER — Ambulatory Visit (INDEPENDENT_AMBULATORY_CARE_PROVIDER_SITE_OTHER): Payer: Medicare Other | Admitting: Orthopaedic Surgery

## 2018-03-11 DIAGNOSIS — Z96652 Presence of left artificial knee joint: Secondary | ICD-10-CM | POA: Diagnosis not present

## 2018-03-11 MED ORDER — LIDOCAINE HCL 1 % IJ SOLN
0.5000 mL | INTRAMUSCULAR | Status: AC | PRN
  Administered 2018-03-11: .5 mL

## 2018-03-11 MED ORDER — METHYLPREDNISOLONE ACETATE 40 MG/ML IJ SUSP
40.0000 mg | INTRAMUSCULAR | Status: AC | PRN
  Administered 2018-03-11: 40 mg via INTRAMUSCULAR

## 2018-03-11 MED ORDER — DICLOFENAC SODIUM 1 % TD GEL: 2.0000 g | Freq: Four times a day (QID) | TRANSDERMAL | 3 refills | Status: DC

## 2018-03-11 NOTE — Progress Notes (Signed)
Office Visit Note   Patient: [name]           Date of Birth: Aug 05, 1951           MRN: [id number] Visit Date: 03/11/2018              Requested by: Gaspar Garbeisovec, Richard W, MD [address] [place], [place] [id number] PCP: Gaspar Garbeisovec, Richard W, MD   Assessment & Plan: Visit Diagnoses:  1. History of left knee replacement     Plan: Continue Voltaren Gel  To left knee. Discontinue Neurontin. Start Vitamin B6 100mg  twice daily. Continue scar tissue massage , quad strengthening.   Follow-Up Instructions: Return in about 6 months (around 09/11/2018).   Orders:  Orders Placed This Encounter  Procedures  . Trigger Point Inj  . XR Knee 1-2 Views Left   Meds ordered this encounter  Medications  . diclofenac sodium (VOLTAREN) 1 % GEL    Sig: Apply 2 g topically 4 (four) times daily.    Dispense:  100 g    Refill:  3      Procedures: Trigger Point Inj Date/Time: 03/11/2018 9:48 AM Performed by: Kirtland Bouchardlark, Cordaryl Decelles W, PA-C Authorized by: Kirtland Bouchardlark, Dollye Glasser W, PA-C   Consent Given by:  Patient Site marked: the procedure site was marked   Timeout: prior to procedure the correct patient, procedure, and site was verified   Indications:  Pain Total # of Trigger Points:  1 Location: lower extremity   Needle Size:  25 G Approach:  Medial Medications #1:  0.5 mL lidocaine 1 %; 40 mg methylPREDNISolone acetate 40 MG/ML     Clinical Data: No additional findings.   Subjective: Chief Complaint  Patient presents with  . Left Knee - Follow-up    HPI [name] is now 18 months s/p left total knee arthroplasty. Still having hypersensitivity around the knee.  Does not feel the injection given at last visit helped at all. The Neurontin has been of no benefit and therefore she has stopped.   Review of Systems No fevers ,chills.   Objective: Vital Signs: There were no vitals taken for this visit.  Physical Exam  Constitutional: She is oriented to person, place, and time. She  appears well-developed and well-nourished. No distress.  Pulmonary/Chest: Effort normal.  Neurological: She is alert and oriented to person, place, and time.  Skin: She is not diaphoretic.    Ortho Exam Full range of motion left knee without pain, No instability with valgus /varus stressing.Surgical incision benign. Hypersensitivity to touch globally left knee. Tenderness maximally over pes anserinus.   Specialty Comments:  No specialty comments available.  Imaging: Xr Knee 1-2 Views Left  Result Date: 03/11/2018 AP lateral views of the left knee: No acute fracture.  Knee is well located.  Status post left total knee arthroplasty without any hardware failure components appear all well-seated.  No bony abnormalities.    PMFS History: Patient Active Problem List   Diagnosis Date Noted  . Unilateral primary osteoarthritis, left knee 08/31/2016  . Status post total left knee replacement 08/31/2016  . Major neurocognitive disorder 11/27/2015  . Alzheimer's dementia 11/02/2015  . Vertigo 05/02/2015  . Subacute confusional state 05/02/2015  . Acute encephalopathy   . Memory deficit   . Hyperlipidemia   . Prediabetes   . TIA (transient ischemic attack) 04/16/2015   Past Medical History:  Diagnosis Date  . Arthritis   . Asthma    as a child   . Carpal tunnel syndrome  hx of   . Dementia   . Eczema   . High cholesterol   . High cholesterol   . Hypertension   . Psoriasis     Family History  Problem Relation Age of Onset  . Cancer Father     Past Surgical History:  Procedure Laterality Date  . HAND SURGERY     Carpal tunnel  . KNEE SURGERY    . SHOULDER SURGERY    . TONSILLECTOMY    . TOTAL KNEE ARTHROPLASTY Left 08/31/2016   Procedure: LEFT TOTAL KNEE ARTHROPLASTY;  Surgeon: Kathryne Hitch, MD;  Location: [facility];  Service: Orthopedics;  Laterality: Left;   Social History   Occupational History  . Not on file  Tobacco Use  . Smoking status: Former  Smoker    Last attempt to quit: 08/20/2004    Years since quitting: 13.5  . Smokeless tobacco: Never Used  Substance and Sexual Activity  . Alcohol use: No    Alcohol/week: 0.0 oz  . Drug use: No  . Sexual activity: Yes

## 2018-03-27 DIAGNOSIS — R410 Disorientation, unspecified: Secondary | ICD-10-CM | POA: Diagnosis not present

## 2018-03-27 DIAGNOSIS — E860 Dehydration: Secondary | ICD-10-CM | POA: Diagnosis not present

## 2018-03-27 DIAGNOSIS — R5383 Other fatigue: Secondary | ICD-10-CM | POA: Diagnosis not present

## 2018-03-27 DIAGNOSIS — M25562 Pain in left knee: Secondary | ICD-10-CM | POA: Diagnosis not present

## 2018-04-02 ENCOUNTER — Other Ambulatory Visit: Payer: Self-pay

## 2018-04-02 ENCOUNTER — Emergency Department (HOSPITAL_COMMUNITY)
Admission: EM | Admit: 2018-04-02 | Discharge: 2018-04-02 | Disposition: A | Discharge status: Home / Self Care | Payer: Medicare Other | Attending: Emergency Medicine | Admitting: Emergency Medicine

## 2018-04-02 ENCOUNTER — Emergency Department (HOSPITAL_COMMUNITY): Payer: Medicare Other

## 2018-04-02 ENCOUNTER — Encounter (HOSPITAL_COMMUNITY): Payer: Self-pay | Admitting: Emergency Medicine

## 2018-04-02 DIAGNOSIS — R110 Nausea: Secondary | ICD-10-CM | POA: Diagnosis not present

## 2018-04-02 DIAGNOSIS — F0280 Dementia in other diseases classified elsewhere without behavioral disturbance: Secondary | ICD-10-CM | POA: Diagnosis not present

## 2018-04-02 DIAGNOSIS — E860 Dehydration: Secondary | ICD-10-CM | POA: Diagnosis not present

## 2018-04-02 DIAGNOSIS — G309 Alzheimer's disease, unspecified: Secondary | ICD-10-CM | POA: Insufficient documentation

## 2018-04-02 DIAGNOSIS — I10 Essential (primary) hypertension: Secondary | ICD-10-CM | POA: Insufficient documentation

## 2018-04-02 DIAGNOSIS — R1033 Periumbilical pain: Secondary | ICD-10-CM | POA: Insufficient documentation

## 2018-04-02 DIAGNOSIS — Z79899 Other long term (current) drug therapy: Secondary | ICD-10-CM | POA: Diagnosis not present

## 2018-04-02 DIAGNOSIS — Z87891 Personal history of nicotine dependence: Secondary | ICD-10-CM | POA: Insufficient documentation

## 2018-04-02 DIAGNOSIS — N390 Urinary tract infection, site not specified: Secondary | ICD-10-CM | POA: Diagnosis not present

## 2018-04-02 DIAGNOSIS — J45909 Unspecified asthma, uncomplicated: Secondary | ICD-10-CM | POA: Diagnosis not present

## 2018-04-02 DIAGNOSIS — R509 Fever, unspecified: Secondary | ICD-10-CM | POA: Diagnosis not present

## 2018-04-02 DIAGNOSIS — R630 Anorexia: Secondary | ICD-10-CM | POA: Diagnosis not present

## 2018-04-02 DIAGNOSIS — Z8673 Personal history of transient ischemic attack (TIA), and cerebral infarction without residual deficits: Secondary | ICD-10-CM | POA: Diagnosis not present

## 2018-04-02 DIAGNOSIS — R634 Abnormal weight loss: Secondary | ICD-10-CM | POA: Diagnosis not present

## 2018-04-02 DIAGNOSIS — R109 Unspecified abdominal pain: Secondary | ICD-10-CM | POA: Diagnosis not present

## 2018-04-02 DIAGNOSIS — R7989 Other specified abnormal findings of blood chemistry: Secondary | ICD-10-CM | POA: Diagnosis not present

## 2018-04-02 LAB — COMPREHENSIVE METABOLIC PANEL
ALK PHOS: 93 U/L (ref 38–126)
ALT: 21 U/L (ref 0–44)
AST: 28 U/L (ref 15–41)
Albumin: 4.4 g/dL (ref 3.5–5.0)
Anion gap: 11 (ref 5–15)
BUN: 19 mg/dL (ref 8–23)
CALCIUM: 10.1 mg/dL (ref 8.9–10.3)
CHLORIDE: 102 mmol/L (ref 98–111)
CO2: 27 mmol/L (ref 22–32)
CREATININE: 0.99 mg/dL (ref 0.44–1.00)
GFR, EST NON AFRICAN AMERICAN: 57 mL/min — AB (ref >60)
Glucose, Bld: 108 mg/dL — ABNORMAL HIGH (ref 70–99)
Potassium: 3.7 mmol/L (ref 3.5–5.1)
Sodium: 140 mmol/L (ref 135–145)
Total Bilirubin: 1.2 mg/dL (ref 0.3–1.2)
Total Protein: 7.2 g/dL (ref 6.5–8.1)

## 2018-04-02 LAB — URINALYSIS, ROUTINE W REFLEX MICROSCOPIC
Glucose, UA: NEGATIVE mg/dL
KETONES UR: 15 mg/dL — AB
Leukocytes, UA: NEGATIVE
NITRITE: NEGATIVE
PH: 5.5 (ref 5.0–8.0)
Protein, ur: 30 mg/dL — AB
Specific Gravity, Urine: >1.03 — ABNORMAL HIGH (ref 1.005–1.030)

## 2018-04-02 LAB — CBC
HCT: 47.6 % — ABNORMAL HIGH (ref 36.0–46.0)
Hemoglobin: 14.9 g/dL (ref 12.0–15.0)
MCH: 28.7 pg (ref 26.0–34.0)
MCHC: 31.3 g/dL (ref 30.0–36.0)
MCV: 91.7 fL (ref 78.0–100.0)
PLATELETS: 189 10*3/uL (ref 150–400)
RBC: 5.19 MIL/uL — ABNORMAL HIGH (ref 3.87–5.11)
RDW: 13.5 % (ref 11.5–15.5)
WBC: 7.1 10*3/uL (ref 4.0–10.5)

## 2018-04-02 LAB — URINALYSIS, MICROSCOPIC (REFLEX)

## 2018-04-02 LAB — LIPASE, BLOOD: Lipase: 52 U/L — ABNORMAL HIGH (ref 11–51)

## 2018-04-02 MED ORDER — ONDANSETRON HCL 4 MG/2ML IJ SOLN
4.0000 mg | Freq: Once | INTRAMUSCULAR | Status: AC
Start: 2018-04-02 — End: 2018-04-02
  Administered 2018-04-02: 4 mg via INTRAVENOUS
  Filled 2018-04-02: qty 2

## 2018-04-02 MED ORDER — IOHEXOL 300 MG/ML  SOLN
100.0000 mL | Freq: Once | INTRAMUSCULAR | Status: AC
  Administered 2018-04-02: 75 mL via INTRAVENOUS

## 2018-04-02 MED ORDER — MORPHINE SULFATE (PF) 4 MG/ML IV SOLN: 4.0000 mg | Freq: Once | INTRAVENOUS | Status: DC

## 2018-04-02 MED ORDER — ONDANSETRON HCL 4 MG PO TABS: 4.0000 mg | ORAL_TABLET | Freq: Four times a day (QID) | ORAL | 0 refills | Status: AC

## 2018-04-02 MED ORDER — SODIUM CHLORIDE 0.9 % IV BOLUS
1000.0000 mL | Freq: Once | INTRAVENOUS | Status: AC
  Administered 2018-04-02: 1000 mL via INTRAVENOUS

## 2018-04-02 NOTE — ED Notes (Signed)
Patient transported to CT 

## 2018-04-02 NOTE — ED Triage Notes (Signed)
Pt presents with one week of abd, chills, dehydration and nausea. [facility]. She was recently seen in [place] for severe dehydration and progressive weakness. Hx. Alzheimer's.  Denies pain at this moment. Husband historian at bedside.

## 2018-04-02 NOTE — ED Provider Notes (Signed)
[facility] Provider Note   CSN: [id number] Arrival date & time: 04/02/18  1102     History   Chief Complaint Chief Complaint  Patient presents with  . Nausea  . Abdominal Pain  . Dehydration    HPI Dannielle Karvonenhyllis D Kaczmarczyk is a 67 y.o. female with history of hyperlipidemia, psoriasis, Alzheimer's is here for evaluation of abdominal pain.  Abdominal pain began 5 days ago, initially improved and went away but now has returned.  Patient has a hard time describing the type of pain and location, thinks it is around her bellybutton and sometimes goes up to her epigastrium.  The pain is intermittent.  Currently has no pain.  There is no alleviating or aggravating factors.  She has not tried anything for the pain.  Associated symptoms include feeling cold, nausea, increased belching.  Husband is concerned she is not eating or drinking much, for several weeks not decreased appetite with 13# weight loss.  Patient was seen at outside ER 5 days ago and was told she was dehydrated, she was given IV fluids and discharged home.  She was seen by PCP earlier today and was recommended she came to the ER for evaluation for abdominal pain.  No fevers, cough, CP, SOB, dysuria, hematuria, dark urine, constipation, diarrhea. No abd surgeries. Had normal BM 30 min ago. Husband is main care taker at home.  HPI  Past Medical History:  Diagnosis Date  . Arthritis   . Asthma    as a child   . Carpal tunnel syndrome    hx of   . Dementia   . Eczema   . High cholesterol   . High cholesterol   . Hypertension   . Psoriasis     Patient Active Problem List   Diagnosis Date Noted  . Unilateral primary osteoarthritis, left knee 08/31/2016  . Status post total left knee replacement 08/31/2016  . Major neurocognitive disorder 11/27/2015  . Alzheimer's dementia 11/02/2015  . Vertigo 05/02/2015  . Subacute confusional state 05/02/2015  . Acute encephalopathy   . Memory deficit    . Hyperlipidemia   . Prediabetes   . TIA (transient ischemic attack) 04/16/2015    Past Surgical History:  Procedure Laterality Date  . HAND SURGERY     Carpal tunnel  . KNEE SURGERY    . SHOULDER SURGERY    . TONSILLECTOMY    . TOTAL KNEE ARTHROPLASTY Left 08/31/2016   Procedure: LEFT TOTAL KNEE ARTHROPLASTY;  Surgeon: Kathryne Hitchhristopher Y Blackman, MD;  Location: [facility];  Service: Orthopedics;  Laterality: Left;     OB History   None      Home Medications    Prior to Admission medications   Medication Sig Start Date End Date Taking? Authorizing Provider  acetaminophen (TYLENOL) 500 MG tablet Take 1,000 mg by mouth as needed for mild pain or headache.   Yes [provider]  diclofenac sodium (VOLTAREN) 1 % GEL Apply 2 g topically 4 (four) times daily. 03/11/18  Yes Kirtland Bouchardlark, Gilbert W, PA-C  losartan (COZAAR) 50 MG tablet Take 50 mg by mouth daily. 09/30/15  Yes [provider]  rivastigmine (EXELON) 13.3 MG/24HR Place 1 patch (13.3 mg total) onto the skin every evening. 09/02/17  Yes Anson FretAhern, Antonia B, MD  rosuvastatin (CRESTOR) 20 MG tablet Take 20 mg by mouth daily. 07/26/16  Yes [provider]  aspirin 81 MG chewable tablet Chew 1 tablet (81 mg total) by mouth 2 (two) times  daily after a meal. Patient not taking: Reported on 04/02/2018 09/03/16   Kathryne Hitch, MD  memantine [facility]) 5 MG tablet Start with one pill daily for 2 weeks. If no side effects increase to one pill twice daily. Patient not taking: Reported on 04/02/2018 09/02/17   Anson Fret, MD  methylPREDNISolone (MEDROL) 4 MG tablet Medrol dose pack. Take as instructed Patient not taking: Reported on 04/02/2018 10/14/17   Kathryne Hitch, MD  ondansetron (ZOFRAN) 4 MG tablet Take 1 tablet (4 mg total) by mouth every 6 (six) hours. 04/02/18   Liberty Handy, PA-C    Family History Family History  Problem Relation Age of Onset  . Cancer Father     Social History Social  History   Tobacco Use  . Smoking status: Former Smoker    Last attempt to quit: 08/20/2004    Years since quitting: 13.6  . Smokeless tobacco: Never Used  Substance Use Topics  . Alcohol use: No    Alcohol/week: 0.0 standard drinks  . Drug use: No     Allergies   Atorvastatin; Ibuprofen; and Nsaids   Review of Systems Review of Systems  Constitutional: Positive for appetite change and unexpected weight change.  Gastrointestinal: Positive for abdominal pain and nausea.       Increased belching   Psychiatric/Behavioral: Positive for confusion (in setting of alzheimer's ).  All other systems reviewed and are negative.    Physical Exam Updated Vital Signs BP 118/88   Pulse (!) 58   Temp 97.6 F (36.4 C) (Oral)   Ht 5\' 8"  (1.727 m)   Wt 84.1 kg   SpO2 100%   BMI 28.19 kg/m   Physical Exam  Constitutional: She is oriented to person, place, and time. She appears well-developed and well-nourished. No distress.  NAD.  HENT:  Head: Normocephalic and atraumatic.  Right Ear: External ear normal.  Left Ear: External ear normal.  Nose: Nose normal.  MMM  Eyes: Conjunctivae and EOM are normal.  Neck: Normal range of motion. Neck supple.  Cardiovascular: Normal rate, regular rhythm and normal heart sounds.  2+ DP and radial pulses bilaterally. No LE edema or calf tenderness.  Pulmonary/Chest: Effort normal and breath sounds normal.  Abdominal: Soft. Normal appearance. There is no tenderness.  NTND. Active BS in lower quadrants. Negative Murphy's and McBurney's. No suprapubic or CVA tenderness. No G/R/R.   Musculoskeletal: Normal range of motion. She exhibits no deformity.  Neurological: She is alert and oriented to person, place, and time.  Skin: Skin is warm and dry. Capillary refill takes less than 2 seconds.  Psychiatric: She has a normal mood and affect. Her behavior is normal. Judgment and thought content normal.  Nursing note and vitals reviewed.    ED Treatments /  Results  Labs (all labs ordered are listed, but only abnormal results are displayed) Labs Reviewed  LIPASE, BLOOD - Abnormal; Notable for the following components:      Result Value   Lipase 52 (*)    All other components within normal limits  COMPREHENSIVE METABOLIC PANEL - Abnormal; Notable for the following components:   Glucose, Bld 108 (*)    GFR calc non Af Amer 57 (*)    All other components within normal limits  URINALYSIS, ROUTINE W REFLEX MICROSCOPIC - Abnormal; Notable for the following components:   APPearance HAZY (*)    Specific Gravity, Urine >1.030 (*)    Hgb urine dipstick MODERATE (*)    Bilirubin  Urine MODERATE (*)    Ketones, ur 15 (*)    Protein, ur 30 (*)    All other components within normal limits  CBC - Abnormal; Notable for the following components:   RBC 5.19 (*)    HCT 47.6 (*)    All other components within normal limits  URINALYSIS, MICROSCOPIC (REFLEX) - Abnormal; Notable for the following components:   Bacteria, UA MANY (*)    Non Squamous Epithelial PRESENT (*)    All other components within normal limits  URINE CULTURE    EKG None  Radiology Ct Abdomen Pelvis W Contrast  Result Date: 04/02/2018 CLINICAL DATA:  One-week history of abdominal pain, nausea, decreased appetite, chills and dehydration. History of hypertension and dementia. EXAM: CT ABDOMEN AND PELVIS WITH CONTRAST TECHNIQUE: Multidetector CT imaging of the abdomen and pelvis was performed using the standard protocol following bolus administration of intravenous contrast. CONTRAST:  75mL OMNIPAQUE IOHEXOL 300 MG/ML  SOLN COMPARISON:  None. FINDINGS: Lower chest: Clear lung bases. No significant pleural or pericardial effusion. Hepatobiliary: There are tiny low-density lesions in the right hepatic lobe on images 13 and 22/3, likely cysts. No suspicious hepatic findings. There is a calcified gallstone. No gallbladder wall thickening, surrounding inflammation or biliary dilatation.  Pancreas: Unremarkable. No pancreatic ductal dilatation or surrounding inflammatory changes. Spleen: Normal in size without focal abnormality. Adrenals/Urinary Tract: Both adrenal glands appear normal. The kidneys appear normal without evidence of urinary tract calculus, suspicious lesion or hydronephrosis. No bladder abnormalities are seen. Stomach/Bowel: No evidence of bowel wall thickening, distention or surrounding inflammatory change. The appendix is not definitively visualized, but there is no pericecal inflammation. Vascular/Lymphatic: There are no enlarged abdominal or pelvic lymph nodes. Diffuse aortic and branch vessel atherosclerosis without evidence of large vessel occlusion. No significant venous abnormalities. Reproductive: Retroverted uterus contains a 4.0 cm calcified fundal fibroid. 2.8 cm well-circumscribed left adnexal lesion on image 67/3 measures water density and could reflect a cyst. No right adnexal mass. The left gonadal vein is mildly prominent without significant varices. Other: No evidence of abdominal wall mass or hernia. No ascites or peritoneal nodularity. Musculoskeletal: No acute or significant osseous findings. Scattered sclerotic lesions in the spine, likely bone islands. There is mild lumbar spine facet hypertrophy. IMPRESSION: 1. No acute findings or clear explanation for the patient's symptoms. 2. Cholelithiasis without evidence of cholecystitis or biliary dilatation. 3. Uterine fibroid and nonspecific small left adnexal lesion, likely a benign cyst. Given the patient's age and presumed postmenopausal status, confirmation with pelvic ultrasound recommended. 4. Extensive Aortic Atherosclerosis (ICD10-I70.0). Electronically Signed   By: Carey Bullocks M.D.   On: 04/02/2018 15:30    Procedures Procedures (including critical care time)  Medications Ordered in ED Medications  sodium chloride 0.9 % bolus 1,000 mL (0 mLs Intravenous Stopped 04/02/18 1528)  ondansetron (ZOFRAN)  injection 4 mg (4 mg Intravenous Given 04/02/18 1402)  iohexol (OMNIPAQUE) 300 MG/ML solution 100 mL (75 mLs Intravenous Contrast Given 04/02/18 1502)     Initial Impression / Assessment and Plan / ED Course  I have reviewed the triage vital signs and the nursing notes.  Pertinent labs & imaging results that were available during my care of the patient were reviewed by me and considered in my medical decision making (see chart for details).  Clinical Course as of Apr 02 1614  Wed Apr 02, 2018  1329 Hgb urine dipstickMarland Kitchen): MODERATE [CG]  1329 Bilirubin Urine(!): MODERATE [CG]  1329 Ketones, ur(!): 15 [CG]  1329 ProteinMarland Kitchen):  30 [CG]  1535 IMPRESSION: 1. No acute findings or clear explanation for the patient's symptoms. 2. Cholelithiasis without evidence of cholecystitis or biliary dilatation. 3. Uterine fibroid and nonspecific small left adnexal lesion, likely a benign cyst. Given the patient's age and presumed postmenopausal status, confirmation with pelvic ultrasound recommended. 4. Extensive Aortic Atherosclerosis (ICD10-I70.0).   CT ABDOMEN PELVIS W CONTRAST [CG]    Clinical Course User Index [CG] Liberty HandyGibbons, Marisella Puccio J, PA-C   67 yo here for nausea, vague abdominal pain, increased belching x 4-5 days. History limited given underlying alzheimer's, pt is a vague historian. All of this in setting of decreased appetite, weight loss in the last several weeks. Husband concerned for dehydration.   Exam as above reassuring.  Abdomen NTND. No signs of significant dehydration.   Labs remarkable for moderate hgb, bilirubin, ketonuria and proteinuria in urine.  We will attempt urine culture. No symptoms of UTI, suprapubic or CVA tenderness today. LFT, creatinine, lipase, electrolytes, WBC WNL.  Given age, vague description of pain in unreliable vague historian, felt CTAP would be indicated to rule out intraabdominal emergency.  This is patient's third visit for similar pain. I discussed patient  with [facility]ue [facility] who saw patient earlier today and sent to ER, she agrees with CTAP. Of note, pt has similar prentation in 2017 which included decreased appetite, nausea, vomiting, abdominal pain.   CTAP negative.  Repeat abd exam benign.  No episodes of emesis in ER.  Pt considered safe for discharge. Unclear etiology of symptoms although most common adverse reaction of exelon include weight loss, nausea, abd pain. Will discharge with zofran, encourage protein smoothies, dietician f/u through PCP. Pt shared with Dr Deretha EmoryZackowski.  Final Clinical Impressions(s) / ED Diagnoses   Final diagnoses:  Periumbilical abdominal pain  Weight loss  Decreased appetite    ED Discharge Orders         Ordered    ondansetron (ZOFRAN) 4 MG tablet  Every 6 hours     04/02/18 1612           Liberty HandyGibbons, Aleene Swanner J, PA-C 04/02/18 1615    Vanetta MuldersZackowski, Scott, MD 04/02/18 1625

## 2018-04-02 NOTE — ED Notes (Signed)
Pt informed that she needs to provide additional urine (for the urine culture) next time she uses the restroom. Pt verbalized understanding.

## 2018-04-02 NOTE — ED Triage Notes (Signed)
PCP requesting a CT of her abdomen.

## 2018-04-02 NOTE — Discharge Instructions (Addendum)
You were seen in the emergency department for abdominal pain, decreased appetite, recent weight loss.  Your lab work, CT scan of your abdomen and pelvis were normal.  The cause of your symptoms is unclear however side effects of rivastigmine include nausea, weight loss, nausea, abdominal pain, decreased appetite. This may or may not be contributing.  Talk to your neurologist regarding side effects.    We recommend following up with your primary care doctor in 1 week for further discussion of nutritional needs.  You may benefit from dietician and/or home health services. Ensure shakes provide good amount of nutrients needed to maintain weight.   Return to the ER for worsening abdominal pain, vomiting, diarrhea, constipation, blood in stool, fevers, chills.

## 2018-04-03 LAB — URINE CULTURE

## 2018-04-05 ENCOUNTER — Telehealth: Payer: Self-pay | Admitting: Neurology

## 2018-04-05 NOTE — Telephone Encounter (Signed)
Patient paged the on call doctor, severe diarrhea nausea and vomiting, wondering if this was due to Exelon patch. Called, no one answered left message to page me back   Paged back a few minutes later. Has been throwing up for 2-3 weeks. She has been to the ED or fluids. I advised to stop the Exelon patch but watch for withdrawal symptoms and page back if any new symptoms. F/u in the office. Go to ED for dehydration.

## 2018-05-15 DIAGNOSIS — F918 Other conduct disorders: Secondary | ICD-10-CM | POA: Diagnosis not present

## 2018-05-15 DIAGNOSIS — I10 Essential (primary) hypertension: Secondary | ICD-10-CM | POA: Diagnosis not present

## 2018-05-15 DIAGNOSIS — G309 Alzheimer's disease, unspecified: Secondary | ICD-10-CM | POA: Diagnosis not present

## 2018-05-15 DIAGNOSIS — E781 Pure hyperglyceridemia: Secondary | ICD-10-CM | POA: Diagnosis not present

## 2018-06-03 ENCOUNTER — Encounter (INDEPENDENT_AMBULATORY_CARE_PROVIDER_SITE_OTHER): Payer: Self-pay | Admitting: Orthopaedic Surgery

## 2018-06-03 ENCOUNTER — Ambulatory Visit (INDEPENDENT_AMBULATORY_CARE_PROVIDER_SITE_OTHER): Payer: Medicare Other

## 2018-06-03 ENCOUNTER — Ambulatory Visit (INDEPENDENT_AMBULATORY_CARE_PROVIDER_SITE_OTHER): Payer: Medicare Other | Admitting: Orthopaedic Surgery

## 2018-06-03 DIAGNOSIS — M25562 Pain in left knee: Secondary | ICD-10-CM | POA: Diagnosis not present

## 2018-06-03 DIAGNOSIS — M25552 Pain in left hip: Secondary | ICD-10-CM

## 2018-06-03 DIAGNOSIS — Z96652 Presence of left artificial knee joint: Secondary | ICD-10-CM

## 2018-06-03 MED ORDER — METHYLPREDNISOLONE 4 MG PO TABS: ORAL_TABLET | ORAL | 0 refills | Status: AC

## 2018-06-03 MED ORDER — METHOCARBAMOL 500 MG PO TABS: 500.0000 mg | ORAL_TABLET | Freq: Four times a day (QID) | ORAL | 0 refills | Status: AC | PRN

## 2018-06-03 NOTE — Progress Notes (Signed)
Office Visit Note   Patient: [name]           Date of Birth: 22-Feb-1951           MRN: [id number] Visit Date: 06/03/2018              Requested by: Gaspar Garbe, MD [address] [place], [place] [address] PCP: Wylene Simmer Adelfa Koh, MD   Assessment & Plan: Visit Diagnoses:  1. Acute pain of left knee   2. Status post total left knee replacement   3. Pain of left hip joint     Plan: I do think her Alzheimer's disease is factoring into her pain as well.  I would like to try a knee brace just for support and this may help her psychologically as well.  I will try a 6-day steroid taper as well as Robaxin.  I also encouraged her and her husband have her use her cane in her opposite side to take pressure off that hip and leg.  My next step would be formal physical therapy.  Her husband was to see how this does before considering therapy so we will see her back in 4 weeks for repeat exam only no x-rays are needed.  Follow-Up Instructions: Return in about 4 weeks (around 07/01/2018).   Orders:  Orders Placed This Encounter  Procedures  . XR Knee 1-2 Views Left  . XR Pelvis 1-2 Views   Meds ordered this encounter  Medications  . methylPREDNISolone (MEDROL) 4 MG tablet    Sig: Medrol dose pack. Take as instructed    Dispense:  21 tablet    Refill:  0  . methocarbamol (ROBAXIN) 500 MG tablet    Sig: Take 1 tablet (500 mg total) by mouth every 6 (six) hours as needed for muscle spasms.    Dispense:  60 tablet    Refill:  0      Procedures: No procedures performed   Clinical Data: No additional findings.   Subjective: Chief Complaint  Patient presents with  . Left Leg - Pain  . Left Knee - Pain  The patient is a very pleasant 67 year old female unfortunately does have mild to moderate Alzheimer's disease.  She has a total knee arthroplasty that we did 21 months ago.  She still has pain all along that left lower extremity.  She was in the emergency room in  August of this year for abdominal pain.  There is a CT scan of her pelvis that I was able to review looking at her spine and her hip.  She does use a cane.  Of note is that she is using it on her left side which is the same side of the leg hurts.  Her husband is with her today.  She cannot take anti-inflammatories.  HPI  Review of Systems She currently denies any headache, chest pain, shortness of breath, fever, chills, nausea, vomiting.  Objective: Vital Signs: There were no vitals taken for this visit.  Physical Exam She is alert and in no acute distress Ortho Exam Examination of her left hip is normal examination of her left knee shows just some slight swelling but no instability on exam.  The knee is not warm.  She has good range of motion of it. Specialty Comments:  No specialty comments available.  Imaging: Xr Pelvis 1-2 Views  Result Date: 06/03/2018 An AP pelvis and lateral left hip shows no acute findings with a normal-appearing hip joint.  Independent review of  CT scan of her pelvis shows a normal-appearing lumbar spine in terms of alignmentand no acute findings.  The pelvis and hip can be seen on the left side with no acute findings.  PMFS History: Patient Active Problem List   Diagnosis Date Noted  . Unilateral primary osteoarthritis, left knee 08/31/2016  . Status post total left knee replacement 08/31/2016  . Major neurocognitive disorder (HCC) 11/27/2015  . Alzheimer's dementia (HCC) 11/02/2015  . Vertigo 05/02/2015  . Subacute confusional state 05/02/2015  . Acute encephalopathy   . Memory deficit   . Hyperlipidemia   . Prediabetes   . TIA (transient ischemic attack) 04/16/2015   Past Medical History:  Diagnosis Date  . Arthritis   . Asthma    as a child   . Carpal tunnel syndrome    hx of   . Dementia (HCC)   . Eczema   . High cholesterol   . High cholesterol   . Hypertension   . Psoriasis     Family History  Problem Relation Age of Onset  .  Cancer Father     Past Surgical History:  Procedure Laterality Date  . HAND SURGERY     Carpal tunnel  . KNEE SURGERY    . SHOULDER SURGERY    . TONSILLECTOMY    . TOTAL KNEE ARTHROPLASTY Left 08/31/2016   Procedure: LEFT TOTAL KNEE ARTHROPLASTY;  Surgeon: Kathryne Hitch, MD;  Location: [facility];  Service: Orthopedics;  Laterality: Left;   Social History   Occupational History  . Not on file  Tobacco Use  . Smoking status: Former Smoker    Last attempt to quit: 08/20/2004    Years since quitting: 13.7  . Smokeless tobacco: Never Used  Substance and Sexual Activity  . Alcohol use: No    Alcohol/week: 0.0 standard drinks  . Drug use: No  . Sexual activity: Yes

## 2018-07-01 ENCOUNTER — Ambulatory Visit (INDEPENDENT_AMBULATORY_CARE_PROVIDER_SITE_OTHER): Payer: Medicare Other | Admitting: Orthopaedic Surgery

## 2018-07-01 DIAGNOSIS — M25562 Pain in left knee: Secondary | ICD-10-CM

## 2018-07-01 DIAGNOSIS — I10 Essential (primary) hypertension: Secondary | ICD-10-CM | POA: Diagnosis not present

## 2018-07-01 DIAGNOSIS — R82998 Other abnormal findings in urine: Secondary | ICD-10-CM | POA: Diagnosis not present

## 2018-07-01 DIAGNOSIS — E7849 Other hyperlipidemia: Secondary | ICD-10-CM | POA: Diagnosis not present

## 2018-07-01 DIAGNOSIS — Z96652 Presence of left artificial knee joint: Secondary | ICD-10-CM | POA: Diagnosis not present

## 2018-07-01 MED ORDER — ACETAMINOPHEN-CODEINE #3 300-30 MG PO TABS: 1.0000 | ORAL_TABLET | Freq: Three times a day (TID) | ORAL | 0 refills | Status: AC | PRN

## 2018-07-01 MED ORDER — LIDOCAINE 5 % EX PTCH: 1.0000 | MEDICATED_PATCH | CUTANEOUS | 0 refills | Status: AC

## 2018-07-01 MED ORDER — DICLOFENAC SODIUM 1 % TD GEL: 2.0000 g | Freq: Four times a day (QID) | TRANSDERMAL | 3 refills | Status: AC

## 2018-07-01 NOTE — Progress Notes (Signed)
The patient continues to complain of left knee pain.  She had a total knee arthroplasty performed 22 months ago.  Some of her pain centers are certainly affected by her dementia.  We have tried everything to get her knee to try to be better from a pain standpoint but this is not getting there.  On exam her knee is cool on the left side her incisions well-healed.  The knee feels ligamentously stable and has good range of motion.  X-rays also show well-seated implant with no complicating features.  This point a mild loss of what else that we can do for her.  I would not recommend any type of surgical intervention just based off of pain.  We will try any type of topical anti-inflammatories from Lidoderm patches Flector patches given Voltaren gel.  All questions concerns were answered and addressed.  Follow-up is as needed I believe they may have an appointment in January so we can always see them then if needed.

## 2018-07-08 DIAGNOSIS — I10 Essential (primary) hypertension: Secondary | ICD-10-CM | POA: Diagnosis not present

## 2018-07-08 DIAGNOSIS — G309 Alzheimer's disease, unspecified: Secondary | ICD-10-CM | POA: Diagnosis not present

## 2018-07-08 DIAGNOSIS — I6789 Other cerebrovascular disease: Secondary | ICD-10-CM | POA: Diagnosis not present

## 2018-07-08 DIAGNOSIS — Z0000 Encounter for general adult medical examination without abnormal findings: Secondary | ICD-10-CM | POA: Diagnosis not present

## 2018-09-08 ENCOUNTER — Ambulatory Visit: Payer: Medicare Other | Admitting: Neurology

## 2018-09-09 ENCOUNTER — Ambulatory Visit (INDEPENDENT_AMBULATORY_CARE_PROVIDER_SITE_OTHER): Payer: Medicare Other | Admitting: Orthopaedic Surgery

## 2018-09-09 ENCOUNTER — Encounter: Payer: Self-pay | Admitting: Neurology

## 2018-10-08 ENCOUNTER — Encounter: Payer: Self-pay | Admitting: Neurology

## 2018-10-24 DIAGNOSIS — R0982 Postnasal drip: Secondary | ICD-10-CM | POA: Diagnosis not present

## 2018-10-24 DIAGNOSIS — T781XXA Other adverse food reactions, not elsewhere classified, initial encounter: Secondary | ICD-10-CM | POA: Diagnosis not present

## 2018-10-24 DIAGNOSIS — S46812A Strain of other muscles, fascia and tendons at shoulder and upper arm level, left arm, initial encounter: Secondary | ICD-10-CM | POA: Diagnosis not present

## 2018-11-27 ENCOUNTER — Ambulatory Visit: Payer: Medicare Other | Admitting: Neurology

## 2019-01-20 DIAGNOSIS — Z471 Aftercare following joint replacement surgery: Secondary | ICD-10-CM | POA: Diagnosis not present

## 2019-01-20 DIAGNOSIS — Z96652 Presence of left artificial knee joint: Secondary | ICD-10-CM | POA: Diagnosis not present

## 2019-04-30 DIAGNOSIS — R7303 Prediabetes: Secondary | ICD-10-CM | POA: Diagnosis not present

## 2019-04-30 DIAGNOSIS — I10 Essential (primary) hypertension: Secondary | ICD-10-CM | POA: Diagnosis not present

## 2019-04-30 DIAGNOSIS — M25562 Pain in left knee: Secondary | ICD-10-CM | POA: Diagnosis not present

## 2019-04-30 DIAGNOSIS — E785 Hyperlipidemia, unspecified: Secondary | ICD-10-CM | POA: Diagnosis not present

## 2019-09-01 DIAGNOSIS — F0281 Dementia in other diseases classified elsewhere with behavioral disturbance: Secondary | ICD-10-CM | POA: Diagnosis not present

## 2019-09-01 DIAGNOSIS — G300 Alzheimer's disease with early onset: Secondary | ICD-10-CM | POA: Diagnosis not present

## 2019-09-01 DIAGNOSIS — Z1231 Encounter for screening mammogram for malignant neoplasm of breast: Secondary | ICD-10-CM | POA: Diagnosis not present

## 2019-09-29 DIAGNOSIS — R7303 Prediabetes: Secondary | ICD-10-CM | POA: Diagnosis not present

## 2019-09-29 DIAGNOSIS — E785 Hyperlipidemia, unspecified: Secondary | ICD-10-CM | POA: Diagnosis not present

## 2019-09-29 DIAGNOSIS — Z0000 Encounter for general adult medical examination without abnormal findings: Secondary | ICD-10-CM | POA: Diagnosis not present

## 2019-09-29 DIAGNOSIS — I10 Essential (primary) hypertension: Secondary | ICD-10-CM | POA: Diagnosis not present

## 2019-09-29 DIAGNOSIS — G300 Alzheimer's disease with early onset: Secondary | ICD-10-CM | POA: Diagnosis not present

## 2019-10-07 DIAGNOSIS — Z13820 Encounter for screening for osteoporosis: Secondary | ICD-10-CM | POA: Diagnosis not present

## 2019-10-07 DIAGNOSIS — Z780 Asymptomatic menopausal state: Secondary | ICD-10-CM | POA: Diagnosis not present

## 2019-11-09 DIAGNOSIS — R0902 Hypoxemia: Secondary | ICD-10-CM | POA: Diagnosis not present

## 2019-11-09 DIAGNOSIS — I959 Hypotension, unspecified: Secondary | ICD-10-CM | POA: Diagnosis not present

## 2019-11-09 DIAGNOSIS — R29810 Facial weakness: Secondary | ICD-10-CM | POA: Diagnosis not present

## 2019-11-09 DIAGNOSIS — G309 Alzheimer's disease, unspecified: Secondary | ICD-10-CM | POA: Diagnosis not present

## 2019-11-09 DIAGNOSIS — Z9989 Dependence on other enabling machines and devices: Secondary | ICD-10-CM | POA: Diagnosis not present

## 2019-11-09 DIAGNOSIS — F0390 Unspecified dementia without behavioral disturbance: Secondary | ICD-10-CM | POA: Diagnosis not present

## 2019-11-09 DIAGNOSIS — N179 Acute kidney failure, unspecified: Secondary | ICD-10-CM | POA: Diagnosis not present

## 2019-11-09 DIAGNOSIS — I10 Essential (primary) hypertension: Secondary | ICD-10-CM | POA: Diagnosis not present

## 2019-11-09 DIAGNOSIS — F0280 Dementia in other diseases classified elsewhere without behavioral disturbance: Secondary | ICD-10-CM | POA: Diagnosis not present

## 2019-11-09 DIAGNOSIS — R9431 Abnormal electrocardiogram [ECG] [EKG]: Secondary | ICD-10-CM | POA: Diagnosis not present

## 2019-11-09 DIAGNOSIS — Z79899 Other long term (current) drug therapy: Secondary | ICD-10-CM | POA: Diagnosis not present

## 2019-11-09 DIAGNOSIS — I700 Atherosclerosis of aorta: Secondary | ICD-10-CM | POA: Diagnosis not present

## 2019-11-09 DIAGNOSIS — R55 Syncope and collapse: Secondary | ICD-10-CM | POA: Diagnosis not present

## 2019-11-09 DIAGNOSIS — Z87891 Personal history of nicotine dependence: Secondary | ICD-10-CM | POA: Diagnosis not present

## 2019-11-10 DIAGNOSIS — I6523 Occlusion and stenosis of bilateral carotid arteries: Secondary | ICD-10-CM | POA: Diagnosis not present

## 2019-11-10 DIAGNOSIS — F0390 Unspecified dementia without behavioral disturbance: Secondary | ICD-10-CM | POA: Diagnosis not present

## 2019-11-10 DIAGNOSIS — R9401 Abnormal electroencephalogram [EEG]: Secondary | ICD-10-CM | POA: Diagnosis not present

## 2019-11-10 DIAGNOSIS — R55 Syncope and collapse: Secondary | ICD-10-CM | POA: Diagnosis not present

## 2019-11-10 DIAGNOSIS — I517 Cardiomegaly: Secondary | ICD-10-CM | POA: Diagnosis not present

## 2019-11-10 DIAGNOSIS — G309 Alzheimer's disease, unspecified: Secondary | ICD-10-CM | POA: Diagnosis not present

## 2019-11-10 DIAGNOSIS — I10 Essential (primary) hypertension: Secondary | ICD-10-CM | POA: Diagnosis not present

## 2019-11-10 DIAGNOSIS — R569 Unspecified convulsions: Secondary | ICD-10-CM | POA: Diagnosis not present

## 2019-11-10 DIAGNOSIS — G9389 Other specified disorders of brain: Secondary | ICD-10-CM | POA: Diagnosis not present

## 2019-11-24 ENCOUNTER — Other Ambulatory Visit: Payer: Self-pay | Admitting: Internal Medicine

## 2019-11-24 DIAGNOSIS — Z1231 Encounter for screening mammogram for malignant neoplasm of breast: Secondary | ICD-10-CM

## 2020-02-17 DIAGNOSIS — Z8679 Personal history of other diseases of the circulatory system: Secondary | ICD-10-CM | POA: Diagnosis not present

## 2020-02-17 DIAGNOSIS — Z8659 Personal history of other mental and behavioral disorders: Secondary | ICD-10-CM | POA: Diagnosis not present

## 2020-02-17 DIAGNOSIS — F0391 Unspecified dementia with behavioral disturbance: Secondary | ICD-10-CM | POA: Diagnosis not present

## 2020-02-29 DIAGNOSIS — G300 Alzheimer's disease with early onset: Secondary | ICD-10-CM | POA: Diagnosis not present

## 2020-02-29 DIAGNOSIS — G309 Alzheimer's disease, unspecified: Secondary | ICD-10-CM | POA: Diagnosis not present

## 2020-02-29 DIAGNOSIS — I10 Essential (primary) hypertension: Secondary | ICD-10-CM | POA: Diagnosis not present

## 2020-02-29 DIAGNOSIS — Z09 Encounter for follow-up examination after completed treatment for conditions other than malignant neoplasm: Secondary | ICD-10-CM | POA: Diagnosis not present

## 2020-03-16 DIAGNOSIS — G300 Alzheimer's disease with early onset: Secondary | ICD-10-CM | POA: Diagnosis not present

## 2020-03-16 DIAGNOSIS — G309 Alzheimer's disease, unspecified: Secondary | ICD-10-CM | POA: Diagnosis not present

## 2020-03-16 DIAGNOSIS — F0280 Dementia in other diseases classified elsewhere without behavioral disturbance: Secondary | ICD-10-CM | POA: Diagnosis not present

## 2020-03-16 DIAGNOSIS — Z111 Encounter for screening for respiratory tuberculosis: Secondary | ICD-10-CM | POA: Diagnosis not present

## 2020-03-16 DIAGNOSIS — Z1152 Encounter for screening for COVID-19: Secondary | ICD-10-CM | POA: Diagnosis not present

## 2020-03-16 DIAGNOSIS — I10 Essential (primary) hypertension: Secondary | ICD-10-CM | POA: Diagnosis not present

## 2020-03-16 DIAGNOSIS — R4589 Other symptoms and signs involving emotional state: Secondary | ICD-10-CM | POA: Diagnosis not present

## 2020-03-17 DIAGNOSIS — F0281 Dementia in other diseases classified elsewhere with behavioral disturbance: Secondary | ICD-10-CM | POA: Diagnosis not present

## 2020-03-17 DIAGNOSIS — G300 Alzheimer's disease with early onset: Secondary | ICD-10-CM | POA: Diagnosis not present

## 2020-03-17 DIAGNOSIS — I10 Essential (primary) hypertension: Secondary | ICD-10-CM | POA: Diagnosis not present

## 2020-03-24 DIAGNOSIS — I10 Essential (primary) hypertension: Secondary | ICD-10-CM | POA: Diagnosis not present

## 2020-03-24 DIAGNOSIS — G309 Alzheimer's disease, unspecified: Secondary | ICD-10-CM | POA: Diagnosis not present

## 2020-03-24 DIAGNOSIS — R45851 Suicidal ideations: Secondary | ICD-10-CM | POA: Diagnosis not present

## 2020-03-24 DIAGNOSIS — G300 Alzheimer's disease with early onset: Secondary | ICD-10-CM | POA: Diagnosis not present

## 2020-03-24 DIAGNOSIS — Z046 Encounter for general psychiatric examination, requested by authority: Secondary | ICD-10-CM | POA: Diagnosis not present

## 2020-03-24 DIAGNOSIS — Z8673 Personal history of transient ischemic attack (TIA), and cerebral infarction without residual deficits: Secondary | ICD-10-CM | POA: Diagnosis not present

## 2020-03-24 DIAGNOSIS — R531 Weakness: Secondary | ICD-10-CM | POA: Diagnosis not present

## 2020-03-24 DIAGNOSIS — F0281 Dementia in other diseases classified elsewhere with behavioral disturbance: Secondary | ICD-10-CM | POA: Diagnosis not present

## 2020-03-24 DIAGNOSIS — Z20822 Contact with and (suspected) exposure to covid-19: Secondary | ICD-10-CM | POA: Diagnosis not present

## 2020-03-24 DIAGNOSIS — F0391 Unspecified dementia with behavioral disturbance: Secondary | ICD-10-CM | POA: Diagnosis not present

## 2020-03-24 DIAGNOSIS — Z888 Allergy status to other drugs, medicaments and biological substances status: Secondary | ICD-10-CM | POA: Diagnosis not present

## 2020-03-24 DIAGNOSIS — Z79899 Other long term (current) drug therapy: Secondary | ICD-10-CM | POA: Diagnosis not present

## 2020-03-24 DIAGNOSIS — Z87891 Personal history of nicotine dependence: Secondary | ICD-10-CM | POA: Diagnosis not present

## 2020-03-24 DIAGNOSIS — F329 Major depressive disorder, single episode, unspecified: Secondary | ICD-10-CM | POA: Diagnosis not present

## 2020-03-24 DIAGNOSIS — R7303 Prediabetes: Secondary | ICD-10-CM | POA: Diagnosis not present

## 2020-03-24 DIAGNOSIS — F0280 Dementia in other diseases classified elsewhere without behavioral disturbance: Secondary | ICD-10-CM | POA: Diagnosis not present

## 2020-03-24 DIAGNOSIS — Z96652 Presence of left artificial knee joint: Secondary | ICD-10-CM | POA: Diagnosis not present

## 2020-03-24 DIAGNOSIS — Z7982 Long term (current) use of aspirin: Secondary | ICD-10-CM | POA: Diagnosis not present

## 2020-03-24 DIAGNOSIS — F0150 Vascular dementia without behavioral disturbance: Secondary | ICD-10-CM | POA: Diagnosis not present

## 2020-03-24 DIAGNOSIS — E785 Hyperlipidemia, unspecified: Secondary | ICD-10-CM | POA: Diagnosis not present

## 2020-03-24 DIAGNOSIS — R451 Restlessness and agitation: Secondary | ICD-10-CM | POA: Diagnosis not present

## 2020-03-24 DIAGNOSIS — Z886 Allergy status to analgesic agent status: Secondary | ICD-10-CM | POA: Diagnosis not present

## 2020-03-26 DIAGNOSIS — R451 Restlessness and agitation: Secondary | ICD-10-CM | POA: Diagnosis not present

## 2020-03-26 DIAGNOSIS — F0150 Vascular dementia without behavioral disturbance: Secondary | ICD-10-CM | POA: Diagnosis not present

## 2020-04-09 DIAGNOSIS — I10 Essential (primary) hypertension: Secondary | ICD-10-CM | POA: Diagnosis not present

## 2020-04-09 DIAGNOSIS — Z7982 Long term (current) use of aspirin: Secondary | ICD-10-CM | POA: Diagnosis not present

## 2020-04-09 DIAGNOSIS — Z96652 Presence of left artificial knee joint: Secondary | ICD-10-CM | POA: Diagnosis not present

## 2020-04-09 DIAGNOSIS — E785 Hyperlipidemia, unspecified: Secondary | ICD-10-CM | POA: Diagnosis not present

## 2020-04-09 DIAGNOSIS — Z9181 History of falling: Secondary | ICD-10-CM | POA: Diagnosis not present

## 2020-04-09 DIAGNOSIS — R7303 Prediabetes: Secondary | ICD-10-CM | POA: Diagnosis not present

## 2020-04-09 DIAGNOSIS — F0281 Dementia in other diseases classified elsewhere with behavioral disturbance: Secondary | ICD-10-CM | POA: Diagnosis not present

## 2020-04-09 DIAGNOSIS — G300 Alzheimer's disease with early onset: Secondary | ICD-10-CM | POA: Diagnosis not present

## 2020-04-09 DIAGNOSIS — F419 Anxiety disorder, unspecified: Secondary | ICD-10-CM | POA: Diagnosis not present

## 2020-04-09 DIAGNOSIS — Z8673 Personal history of transient ischemic attack (TIA), and cerebral infarction without residual deficits: Secondary | ICD-10-CM | POA: Diagnosis not present

## 2020-04-09 DIAGNOSIS — F329 Major depressive disorder, single episode, unspecified: Secondary | ICD-10-CM | POA: Diagnosis not present

## 2020-04-29 DIAGNOSIS — Z03818 Encounter for observation for suspected exposure to other biological agents ruled out: Secondary | ICD-10-CM | POA: Diagnosis not present

## 2020-05-11 DIAGNOSIS — Z03818 Encounter for observation for suspected exposure to other biological agents ruled out: Secondary | ICD-10-CM | POA: Diagnosis not present

## 2020-05-18 DIAGNOSIS — Z03818 Encounter for observation for suspected exposure to other biological agents ruled out: Secondary | ICD-10-CM | POA: Diagnosis not present

## 2020-05-25 DIAGNOSIS — Z03818 Encounter for observation for suspected exposure to other biological agents ruled out: Secondary | ICD-10-CM | POA: Diagnosis not present

## 2020-06-01 DIAGNOSIS — E782 Mixed hyperlipidemia: Secondary | ICD-10-CM | POA: Diagnosis not present

## 2020-06-01 DIAGNOSIS — J309 Allergic rhinitis, unspecified: Secondary | ICD-10-CM | POA: Diagnosis not present

## 2020-06-01 DIAGNOSIS — Z7982 Long term (current) use of aspirin: Secondary | ICD-10-CM | POA: Diagnosis not present

## 2020-06-01 DIAGNOSIS — Z03818 Encounter for observation for suspected exposure to other biological agents ruled out: Secondary | ICD-10-CM | POA: Diagnosis not present

## 2020-06-01 DIAGNOSIS — I10 Essential (primary) hypertension: Secondary | ICD-10-CM | POA: Diagnosis not present

## 2020-06-02 DIAGNOSIS — G309 Alzheimer's disease, unspecified: Secondary | ICD-10-CM | POA: Diagnosis not present

## 2020-06-02 DIAGNOSIS — G4701 Insomnia due to medical condition: Secondary | ICD-10-CM | POA: Diagnosis not present

## 2020-06-02 DIAGNOSIS — F0281 Dementia in other diseases classified elsewhere with behavioral disturbance: Secondary | ICD-10-CM | POA: Diagnosis not present

## 2020-06-02 DIAGNOSIS — F062 Psychotic disorder with delusions due to known physiological condition: Secondary | ICD-10-CM | POA: Diagnosis not present

## 2020-06-08 DIAGNOSIS — Z03818 Encounter for observation for suspected exposure to other biological agents ruled out: Secondary | ICD-10-CM | POA: Diagnosis not present

## 2020-06-15 DIAGNOSIS — Z03818 Encounter for observation for suspected exposure to other biological agents ruled out: Secondary | ICD-10-CM | POA: Diagnosis not present

## 2020-06-30 DIAGNOSIS — G4701 Insomnia due to medical condition: Secondary | ICD-10-CM | POA: Diagnosis not present

## 2020-06-30 DIAGNOSIS — G309 Alzheimer's disease, unspecified: Secondary | ICD-10-CM | POA: Diagnosis not present

## 2020-06-30 DIAGNOSIS — F062 Psychotic disorder with delusions due to known physiological condition: Secondary | ICD-10-CM | POA: Diagnosis not present

## 2020-06-30 DIAGNOSIS — F0281 Dementia in other diseases classified elsewhere with behavioral disturbance: Secondary | ICD-10-CM | POA: Diagnosis not present

## 2020-07-06 DIAGNOSIS — I10 Essential (primary) hypertension: Secondary | ICD-10-CM | POA: Diagnosis not present

## 2020-07-06 DIAGNOSIS — E782 Mixed hyperlipidemia: Secondary | ICD-10-CM | POA: Diagnosis not present

## 2020-07-06 DIAGNOSIS — R7303 Prediabetes: Secondary | ICD-10-CM | POA: Diagnosis not present

## 2020-07-20 DIAGNOSIS — Z03818 Encounter for observation for suspected exposure to other biological agents ruled out: Secondary | ICD-10-CM | POA: Diagnosis not present

## 2020-07-27 DIAGNOSIS — K5900 Constipation, unspecified: Secondary | ICD-10-CM | POA: Diagnosis not present

## 2020-07-27 DIAGNOSIS — M1990 Unspecified osteoarthritis, unspecified site: Secondary | ICD-10-CM | POA: Diagnosis not present

## 2020-07-27 DIAGNOSIS — J309 Allergic rhinitis, unspecified: Secondary | ICD-10-CM | POA: Diagnosis not present

## 2020-08-11 DIAGNOSIS — G4701 Insomnia due to medical condition: Secondary | ICD-10-CM | POA: Diagnosis not present

## 2020-08-11 DIAGNOSIS — F0281 Dementia in other diseases classified elsewhere with behavioral disturbance: Secondary | ICD-10-CM | POA: Diagnosis not present

## 2020-08-11 DIAGNOSIS — G309 Alzheimer's disease, unspecified: Secondary | ICD-10-CM | POA: Diagnosis not present

## 2020-08-11 DIAGNOSIS — F062 Psychotic disorder with delusions due to known physiological condition: Secondary | ICD-10-CM | POA: Diagnosis not present

## 2020-08-25 DIAGNOSIS — J309 Allergic rhinitis, unspecified: Secondary | ICD-10-CM | POA: Diagnosis not present

## 2020-08-25 DIAGNOSIS — E782 Mixed hyperlipidemia: Secondary | ICD-10-CM | POA: Diagnosis not present

## 2020-08-25 DIAGNOSIS — I10 Essential (primary) hypertension: Secondary | ICD-10-CM | POA: Diagnosis not present

## 2020-09-08 DIAGNOSIS — G4701 Insomnia due to medical condition: Secondary | ICD-10-CM | POA: Diagnosis not present

## 2020-09-08 DIAGNOSIS — G309 Alzheimer's disease, unspecified: Secondary | ICD-10-CM | POA: Diagnosis not present

## 2020-09-08 DIAGNOSIS — Z03818 Encounter for observation for suspected exposure to other biological agents ruled out: Secondary | ICD-10-CM | POA: Diagnosis not present

## 2020-09-08 DIAGNOSIS — F0281 Dementia in other diseases classified elsewhere with behavioral disturbance: Secondary | ICD-10-CM | POA: Diagnosis not present

## 2020-09-08 DIAGNOSIS — F062 Psychotic disorder with delusions due to known physiological condition: Secondary | ICD-10-CM | POA: Diagnosis not present

## 2020-09-22 DIAGNOSIS — J309 Allergic rhinitis, unspecified: Secondary | ICD-10-CM | POA: Diagnosis not present

## 2020-09-22 DIAGNOSIS — I10 Essential (primary) hypertension: Secondary | ICD-10-CM | POA: Diagnosis not present

## 2020-09-22 DIAGNOSIS — E782 Mixed hyperlipidemia: Secondary | ICD-10-CM | POA: Diagnosis not present

## 2020-09-22 DIAGNOSIS — M1990 Unspecified osteoarthritis, unspecified site: Secondary | ICD-10-CM | POA: Diagnosis not present

## 2020-10-06 DIAGNOSIS — G4701 Insomnia due to medical condition: Secondary | ICD-10-CM | POA: Diagnosis not present

## 2020-10-06 DIAGNOSIS — F0281 Dementia in other diseases classified elsewhere with behavioral disturbance: Secondary | ICD-10-CM | POA: Diagnosis not present

## 2020-10-06 DIAGNOSIS — G309 Alzheimer's disease, unspecified: Secondary | ICD-10-CM | POA: Diagnosis not present

## 2020-10-06 DIAGNOSIS — F062 Psychotic disorder with delusions due to known physiological condition: Secondary | ICD-10-CM | POA: Diagnosis not present

## 2020-10-20 DIAGNOSIS — I10 Essential (primary) hypertension: Secondary | ICD-10-CM | POA: Diagnosis not present

## 2020-10-20 DIAGNOSIS — J309 Allergic rhinitis, unspecified: Secondary | ICD-10-CM | POA: Diagnosis not present

## 2020-10-20 DIAGNOSIS — E782 Mixed hyperlipidemia: Secondary | ICD-10-CM | POA: Diagnosis not present

## 2020-10-20 DIAGNOSIS — M1990 Unspecified osteoarthritis, unspecified site: Secondary | ICD-10-CM | POA: Diagnosis not present

## 2020-11-03 DIAGNOSIS — G309 Alzheimer's disease, unspecified: Secondary | ICD-10-CM | POA: Diagnosis not present

## 2020-11-03 DIAGNOSIS — F062 Psychotic disorder with delusions due to known physiological condition: Secondary | ICD-10-CM | POA: Diagnosis not present

## 2020-11-03 DIAGNOSIS — G4701 Insomnia due to medical condition: Secondary | ICD-10-CM | POA: Diagnosis not present

## 2020-11-03 DIAGNOSIS — F0281 Dementia in other diseases classified elsewhere with behavioral disturbance: Secondary | ICD-10-CM | POA: Diagnosis not present

## 2020-11-08 DIAGNOSIS — J309 Allergic rhinitis, unspecified: Secondary | ICD-10-CM | POA: Diagnosis not present

## 2020-11-08 DIAGNOSIS — E782 Mixed hyperlipidemia: Secondary | ICD-10-CM | POA: Diagnosis not present

## 2020-11-08 DIAGNOSIS — I10 Essential (primary) hypertension: Secondary | ICD-10-CM | POA: Diagnosis not present

## 2020-11-16 DIAGNOSIS — G309 Alzheimer's disease, unspecified: Secondary | ICD-10-CM | POA: Diagnosis not present

## 2020-11-16 DIAGNOSIS — I739 Peripheral vascular disease, unspecified: Secondary | ICD-10-CM | POA: Diagnosis not present

## 2020-11-17 DIAGNOSIS — G4701 Insomnia due to medical condition: Secondary | ICD-10-CM | POA: Diagnosis not present

## 2020-11-17 DIAGNOSIS — F0281 Dementia in other diseases classified elsewhere with behavioral disturbance: Secondary | ICD-10-CM | POA: Diagnosis not present

## 2020-11-17 DIAGNOSIS — G309 Alzheimer's disease, unspecified: Secondary | ICD-10-CM | POA: Diagnosis not present

## 2020-11-17 DIAGNOSIS — F062 Psychotic disorder with delusions due to known physiological condition: Secondary | ICD-10-CM | POA: Diagnosis not present

## 2020-11-24 DIAGNOSIS — E782 Mixed hyperlipidemia: Secondary | ICD-10-CM | POA: Diagnosis not present

## 2020-11-24 DIAGNOSIS — I10 Essential (primary) hypertension: Secondary | ICD-10-CM | POA: Diagnosis not present

## 2020-11-24 DIAGNOSIS — M1990 Unspecified osteoarthritis, unspecified site: Secondary | ICD-10-CM | POA: Diagnosis not present

## 2020-11-24 DIAGNOSIS — K5900 Constipation, unspecified: Secondary | ICD-10-CM | POA: Diagnosis not present

## 2020-12-15 DIAGNOSIS — G309 Alzheimer's disease, unspecified: Secondary | ICD-10-CM | POA: Diagnosis not present

## 2020-12-15 DIAGNOSIS — F0281 Dementia in other diseases classified elsewhere with behavioral disturbance: Secondary | ICD-10-CM | POA: Diagnosis not present

## 2020-12-15 DIAGNOSIS — G4701 Insomnia due to medical condition: Secondary | ICD-10-CM | POA: Diagnosis not present

## 2020-12-22 DIAGNOSIS — K5900 Constipation, unspecified: Secondary | ICD-10-CM | POA: Diagnosis not present

## 2020-12-22 DIAGNOSIS — E782 Mixed hyperlipidemia: Secondary | ICD-10-CM | POA: Diagnosis not present

## 2020-12-22 DIAGNOSIS — J309 Allergic rhinitis, unspecified: Secondary | ICD-10-CM | POA: Diagnosis not present

## 2020-12-29 DIAGNOSIS — G309 Alzheimer's disease, unspecified: Secondary | ICD-10-CM | POA: Diagnosis not present

## 2020-12-29 DIAGNOSIS — F0280 Dementia in other diseases classified elsewhere without behavioral disturbance: Secondary | ICD-10-CM | POA: Diagnosis not present

## 2020-12-29 DIAGNOSIS — I10 Essential (primary) hypertension: Secondary | ICD-10-CM | POA: Diagnosis not present

## 2020-12-29 DIAGNOSIS — M79621 Pain in right upper arm: Secondary | ICD-10-CM | POA: Diagnosis not present

## 2020-12-29 DIAGNOSIS — E785 Hyperlipidemia, unspecified: Secondary | ICD-10-CM | POA: Diagnosis not present

## 2020-12-29 DIAGNOSIS — M25511 Pain in right shoulder: Secondary | ICD-10-CM | POA: Diagnosis not present

## 2021-01-03 DIAGNOSIS — I739 Peripheral vascular disease, unspecified: Secondary | ICD-10-CM | POA: Diagnosis not present

## 2021-01-03 DIAGNOSIS — B351 Tinea unguium: Secondary | ICD-10-CM | POA: Diagnosis not present

## 2021-01-03 DIAGNOSIS — Q845 Enlarged and hypertrophic nails: Secondary | ICD-10-CM | POA: Diagnosis not present

## 2021-01-03 DIAGNOSIS — L84 Corns and callosities: Secondary | ICD-10-CM | POA: Diagnosis not present

## 2021-01-11 DIAGNOSIS — I739 Peripheral vascular disease, unspecified: Secondary | ICD-10-CM | POA: Diagnosis not present

## 2021-01-11 DIAGNOSIS — G309 Alzheimer's disease, unspecified: Secondary | ICD-10-CM | POA: Diagnosis not present

## 2021-01-18 ENCOUNTER — Other Ambulatory Visit: Payer: Self-pay | Admitting: Internal Medicine

## 2021-01-18 DIAGNOSIS — Z1231 Encounter for screening mammogram for malignant neoplasm of breast: Secondary | ICD-10-CM

## 2021-01-19 DIAGNOSIS — F0390 Unspecified dementia without behavioral disturbance: Secondary | ICD-10-CM | POA: Diagnosis not present

## 2021-01-19 DIAGNOSIS — R2689 Other abnormalities of gait and mobility: Secondary | ICD-10-CM | POA: Diagnosis not present

## 2021-01-19 DIAGNOSIS — R2681 Unsteadiness on feet: Secondary | ICD-10-CM | POA: Diagnosis not present

## 2021-01-20 DIAGNOSIS — R2681 Unsteadiness on feet: Secondary | ICD-10-CM | POA: Diagnosis not present

## 2021-01-20 DIAGNOSIS — R2689 Other abnormalities of gait and mobility: Secondary | ICD-10-CM | POA: Diagnosis not present

## 2021-01-20 DIAGNOSIS — F0390 Unspecified dementia without behavioral disturbance: Secondary | ICD-10-CM | POA: Diagnosis not present

## 2021-01-23 DIAGNOSIS — R2681 Unsteadiness on feet: Secondary | ICD-10-CM | POA: Diagnosis not present

## 2021-01-23 DIAGNOSIS — R2689 Other abnormalities of gait and mobility: Secondary | ICD-10-CM | POA: Diagnosis not present

## 2021-01-23 DIAGNOSIS — F0390 Unspecified dementia without behavioral disturbance: Secondary | ICD-10-CM | POA: Diagnosis not present

## 2021-01-24 DIAGNOSIS — R2681 Unsteadiness on feet: Secondary | ICD-10-CM | POA: Diagnosis not present

## 2021-01-24 DIAGNOSIS — R2689 Other abnormalities of gait and mobility: Secondary | ICD-10-CM | POA: Diagnosis not present

## 2021-01-24 DIAGNOSIS — F0390 Unspecified dementia without behavioral disturbance: Secondary | ICD-10-CM | POA: Diagnosis not present

## 2021-01-25 DIAGNOSIS — F0390 Unspecified dementia without behavioral disturbance: Secondary | ICD-10-CM | POA: Diagnosis not present

## 2021-01-25 DIAGNOSIS — R2689 Other abnormalities of gait and mobility: Secondary | ICD-10-CM | POA: Diagnosis not present

## 2021-01-25 DIAGNOSIS — R2681 Unsteadiness on feet: Secondary | ICD-10-CM | POA: Diagnosis not present

## 2021-01-26 DIAGNOSIS — F0390 Unspecified dementia without behavioral disturbance: Secondary | ICD-10-CM | POA: Diagnosis not present

## 2021-01-26 DIAGNOSIS — R2681 Unsteadiness on feet: Secondary | ICD-10-CM | POA: Diagnosis not present

## 2021-01-26 DIAGNOSIS — R2689 Other abnormalities of gait and mobility: Secondary | ICD-10-CM | POA: Diagnosis not present

## 2021-01-30 DIAGNOSIS — F0390 Unspecified dementia without behavioral disturbance: Secondary | ICD-10-CM | POA: Diagnosis not present

## 2021-01-30 DIAGNOSIS — R2689 Other abnormalities of gait and mobility: Secondary | ICD-10-CM | POA: Diagnosis not present

## 2021-01-30 DIAGNOSIS — R2681 Unsteadiness on feet: Secondary | ICD-10-CM | POA: Diagnosis not present

## 2021-01-31 DIAGNOSIS — G309 Alzheimer's disease, unspecified: Secondary | ICD-10-CM | POA: Diagnosis not present

## 2021-01-31 DIAGNOSIS — R2681 Unsteadiness on feet: Secondary | ICD-10-CM | POA: Diagnosis not present

## 2021-01-31 DIAGNOSIS — F0391 Unspecified dementia with behavioral disturbance: Secondary | ICD-10-CM | POA: Diagnosis not present

## 2021-01-31 DIAGNOSIS — F330 Major depressive disorder, recurrent, mild: Secondary | ICD-10-CM | POA: Diagnosis not present

## 2021-01-31 DIAGNOSIS — R2689 Other abnormalities of gait and mobility: Secondary | ICD-10-CM | POA: Diagnosis not present

## 2021-01-31 DIAGNOSIS — F5101 Primary insomnia: Secondary | ICD-10-CM | POA: Diagnosis not present

## 2021-01-31 DIAGNOSIS — F0390 Unspecified dementia without behavioral disturbance: Secondary | ICD-10-CM | POA: Diagnosis not present

## 2021-02-01 DIAGNOSIS — R2689 Other abnormalities of gait and mobility: Secondary | ICD-10-CM | POA: Diagnosis not present

## 2021-02-01 DIAGNOSIS — F0390 Unspecified dementia without behavioral disturbance: Secondary | ICD-10-CM | POA: Diagnosis not present

## 2021-02-01 DIAGNOSIS — R2681 Unsteadiness on feet: Secondary | ICD-10-CM | POA: Diagnosis not present

## 2021-02-12 DIAGNOSIS — R0902 Hypoxemia: Secondary | ICD-10-CM | POA: Diagnosis not present

## 2021-02-12 DIAGNOSIS — R0602 Shortness of breath: Secondary | ICD-10-CM | POA: Diagnosis not present

## 2021-02-12 DIAGNOSIS — R0989 Other specified symptoms and signs involving the circulatory and respiratory systems: Secondary | ICD-10-CM | POA: Diagnosis not present

## 2021-02-13 DIAGNOSIS — E785 Hyperlipidemia, unspecified: Secondary | ICD-10-CM | POA: Diagnosis not present

## 2021-02-13 DIAGNOSIS — I10 Essential (primary) hypertension: Secondary | ICD-10-CM | POA: Diagnosis not present

## 2021-02-13 DIAGNOSIS — E039 Hypothyroidism, unspecified: Secondary | ICD-10-CM | POA: Diagnosis not present

## 2021-02-13 DIAGNOSIS — F0390 Unspecified dementia without behavioral disturbance: Secondary | ICD-10-CM | POA: Diagnosis not present

## 2021-02-14 DIAGNOSIS — F330 Major depressive disorder, recurrent, mild: Secondary | ICD-10-CM | POA: Diagnosis not present

## 2021-02-14 DIAGNOSIS — M25511 Pain in right shoulder: Secondary | ICD-10-CM | POA: Diagnosis not present

## 2021-02-14 DIAGNOSIS — F0280 Dementia in other diseases classified elsewhere without behavioral disturbance: Secondary | ICD-10-CM | POA: Diagnosis not present

## 2021-02-14 DIAGNOSIS — F0391 Unspecified dementia with behavioral disturbance: Secondary | ICD-10-CM | POA: Diagnosis not present

## 2021-02-14 DIAGNOSIS — G309 Alzheimer's disease, unspecified: Secondary | ICD-10-CM | POA: Diagnosis not present

## 2021-02-14 DIAGNOSIS — F5101 Primary insomnia: Secondary | ICD-10-CM | POA: Diagnosis not present

## 2021-02-15 DIAGNOSIS — I10 Essential (primary) hypertension: Secondary | ICD-10-CM | POA: Diagnosis not present

## 2021-03-13 DIAGNOSIS — R4182 Altered mental status, unspecified: Secondary | ICD-10-CM | POA: Diagnosis not present

## 2021-03-14 DIAGNOSIS — F330 Major depressive disorder, recurrent, mild: Secondary | ICD-10-CM | POA: Diagnosis not present

## 2021-03-14 DIAGNOSIS — F0391 Unspecified dementia with behavioral disturbance: Secondary | ICD-10-CM | POA: Diagnosis not present

## 2021-03-14 DIAGNOSIS — F5101 Primary insomnia: Secondary | ICD-10-CM | POA: Diagnosis not present

## 2021-03-14 DIAGNOSIS — G309 Alzheimer's disease, unspecified: Secondary | ICD-10-CM | POA: Diagnosis not present

## 2021-03-21 DIAGNOSIS — M6281 Muscle weakness (generalized): Secondary | ICD-10-CM | POA: Diagnosis not present

## 2021-03-21 DIAGNOSIS — R4182 Altered mental status, unspecified: Secondary | ICD-10-CM | POA: Diagnosis not present

## 2021-03-21 DIAGNOSIS — Z79899 Other long term (current) drug therapy: Secondary | ICD-10-CM | POA: Diagnosis not present

## 2021-03-21 DIAGNOSIS — I10 Essential (primary) hypertension: Secondary | ICD-10-CM | POA: Diagnosis not present

## 2021-03-28 DIAGNOSIS — F0391 Unspecified dementia with behavioral disturbance: Secondary | ICD-10-CM | POA: Diagnosis not present

## 2021-03-28 DIAGNOSIS — G309 Alzheimer's disease, unspecified: Secondary | ICD-10-CM | POA: Diagnosis not present

## 2021-03-28 DIAGNOSIS — F330 Major depressive disorder, recurrent, mild: Secondary | ICD-10-CM | POA: Diagnosis not present

## 2021-03-28 DIAGNOSIS — F5101 Primary insomnia: Secondary | ICD-10-CM | POA: Diagnosis not present

## 2021-05-08 DIAGNOSIS — U071 COVID-19: Secondary | ICD-10-CM | POA: Diagnosis not present

## 2021-05-08 DIAGNOSIS — R059 Cough, unspecified: Secondary | ICD-10-CM | POA: Diagnosis not present

## 2021-05-08 DIAGNOSIS — R0989 Other specified symptoms and signs involving the circulatory and respiratory systems: Secondary | ICD-10-CM | POA: Diagnosis not present

## 2021-05-08 DIAGNOSIS — J3489 Other specified disorders of nose and nasal sinuses: Secondary | ICD-10-CM | POA: Diagnosis not present

## 2021-07-18 DIAGNOSIS — I10 Essential (primary) hypertension: Secondary | ICD-10-CM | POA: Diagnosis not present

## 2021-07-18 DIAGNOSIS — R4182 Altered mental status, unspecified: Secondary | ICD-10-CM | POA: Diagnosis not present

## 2021-07-18 DIAGNOSIS — Z79899 Other long term (current) drug therapy: Secondary | ICD-10-CM | POA: Diagnosis not present

## 2021-07-18 DIAGNOSIS — M6281 Muscle weakness (generalized): Secondary | ICD-10-CM | POA: Diagnosis not present

## 2021-07-24 DIAGNOSIS — H5789 Other specified disorders of eye and adnexa: Secondary | ICD-10-CM | POA: Diagnosis not present

## 2021-09-20 DEATH — deceased
